# Patient Record
Sex: Male | Born: 1945 | Race: Black or African American | Hispanic: No | Marital: Married | Smoking: Former smoker
Health system: Southern US, Community
[De-identification: ages and names within clinical notes are randomized; demographics above are authoritative.]

## PROBLEM LIST (undated history)

## (undated) DIAGNOSIS — I10 Essential (primary) hypertension: Secondary | ICD-10-CM

## (undated) DIAGNOSIS — G629 Polyneuropathy, unspecified: Secondary | ICD-10-CM

## (undated) DIAGNOSIS — C61 Malignant neoplasm of prostate: Secondary | ICD-10-CM

## (undated) DIAGNOSIS — D649 Anemia, unspecified: Secondary | ICD-10-CM

## (undated) DIAGNOSIS — L600 Ingrowing nail: Secondary | ICD-10-CM

## (undated) DIAGNOSIS — D571 Sickle-cell disease without crisis: Secondary | ICD-10-CM

## (undated) DIAGNOSIS — D573 Sickle-cell trait: Secondary | ICD-10-CM

## (undated) HISTORY — DX: Anemia, unspecified: D64.9

## (undated) HISTORY — PX: COLONOSCOPY: SHX174

## (undated) HISTORY — DX: Ingrowing nail: L60.0

## (undated) HISTORY — PX: CIRCUMCISION: SUR203

## (undated) HISTORY — DX: Essential (primary) hypertension: I10

## (undated) HISTORY — DX: Sickle-cell trait: D57.3

## (undated) HISTORY — DX: Malignant neoplasm of prostate: C61

## (undated) HISTORY — DX: Sickle-cell disease without crisis: D57.1

---

## 2004-08-26 ENCOUNTER — Encounter (HOSPITAL_COMMUNITY): Admission: RE | Admit: 2004-08-26 | Discharge: 2004-11-24 | Payer: Self-pay | Admitting: Internal Medicine

## 2004-08-26 ENCOUNTER — Ambulatory Visit: Payer: Self-pay | Admitting: Internal Medicine

## 2004-09-07 ENCOUNTER — Ambulatory Visit: Payer: Self-pay | Admitting: Internal Medicine

## 2004-09-09 ENCOUNTER — Ambulatory Visit: Payer: Self-pay | Admitting: Gastroenterology

## 2004-09-16 ENCOUNTER — Ambulatory Visit: Payer: Self-pay | Admitting: Gastroenterology

## 2004-09-28 ENCOUNTER — Ambulatory Visit: Payer: Self-pay | Admitting: Gastroenterology

## 2004-10-13 ENCOUNTER — Ambulatory Visit: Payer: Self-pay | Admitting: Internal Medicine

## 2004-10-16 ENCOUNTER — Ambulatory Visit: Payer: Self-pay | Admitting: Gastroenterology

## 2004-10-29 ENCOUNTER — Ambulatory Visit: Payer: Self-pay | Admitting: Gastroenterology

## 2004-11-27 ENCOUNTER — Ambulatory Visit: Payer: Self-pay | Admitting: Internal Medicine

## 2004-12-01 ENCOUNTER — Ambulatory Visit: Payer: Self-pay | Admitting: Internal Medicine

## 2005-01-29 ENCOUNTER — Ambulatory Visit: Payer: Self-pay | Admitting: Internal Medicine

## 2005-04-30 ENCOUNTER — Ambulatory Visit: Payer: Self-pay | Admitting: Internal Medicine

## 2005-08-20 ENCOUNTER — Ambulatory Visit: Payer: Self-pay | Admitting: Internal Medicine

## 2005-08-27 ENCOUNTER — Ambulatory Visit: Payer: Self-pay | Admitting: Internal Medicine

## 2005-11-26 ENCOUNTER — Ambulatory Visit: Payer: Self-pay | Admitting: Internal Medicine

## 2005-12-03 ENCOUNTER — Ambulatory Visit: Payer: Self-pay | Admitting: Internal Medicine

## 2006-02-04 ENCOUNTER — Ambulatory Visit: Payer: Self-pay | Admitting: Internal Medicine

## 2006-02-18 ENCOUNTER — Ambulatory Visit: Payer: Self-pay | Admitting: Internal Medicine

## 2006-04-27 ENCOUNTER — Ambulatory Visit: Payer: Self-pay | Admitting: Internal Medicine

## 2006-07-11 ENCOUNTER — Ambulatory Visit: Payer: Self-pay | Admitting: Internal Medicine

## 2006-11-18 ENCOUNTER — Ambulatory Visit: Payer: Self-pay | Admitting: Internal Medicine

## 2007-04-29 ENCOUNTER — Ambulatory Visit: Payer: Self-pay | Admitting: Internal Medicine

## 2007-11-06 ENCOUNTER — Ambulatory Visit: Payer: Self-pay | Admitting: Internal Medicine

## 2007-11-16 LAB — CONVERTED CEMR LAB
AST: 25 units/L (ref 0–37)
Albumin: 3.6 g/dL (ref 3.5–5.2)
Alkaline Phosphatase: 70 units/L (ref 39–117)
BUN: 9 mg/dL (ref 6–23)
Basophils Absolute: 0 10*3/uL (ref 0.0–0.1)
Calcium: 9.5 mg/dL (ref 8.4–10.5)
Chloride: 103 meq/L (ref 96–112)
Creatinine, Ser: 1.7 mg/dL — ABNORMAL HIGH (ref 0.4–1.5)
Eosinophils Absolute: 0.3 10*3/uL (ref 0.0–0.6)
GFR calc non Af Amer: 44 mL/min
HCT: 34.8 % — ABNORMAL LOW (ref 39.0–52.0)
MCHC: 31.1 g/dL (ref 30.0–36.0)
MCV: 72.9 fL — ABNORMAL LOW (ref 78.0–100.0)
Monocytes Relative: 12.4 % — ABNORMAL HIGH (ref 3.0–11.0)
PSA: 2.69 ng/mL (ref 0.10–4.00)
Platelets: 156 10*3/uL (ref 150–400)
RBC: 4.78 M/uL (ref 4.22–5.81)
RDW: 17.8 % — ABNORMAL HIGH (ref 11.5–14.6)
Total Bilirubin: 0.6 mg/dL (ref 0.3–1.2)

## 2007-11-27 ENCOUNTER — Ambulatory Visit: Payer: Self-pay | Admitting: Internal Medicine

## 2007-11-27 DIAGNOSIS — I10 Essential (primary) hypertension: Secondary | ICD-10-CM

## 2007-11-27 DIAGNOSIS — N400 Enlarged prostate without lower urinary tract symptoms: Secondary | ICD-10-CM

## 2007-11-27 DIAGNOSIS — D573 Sickle-cell trait: Secondary | ICD-10-CM

## 2007-11-27 DIAGNOSIS — R739 Hyperglycemia, unspecified: Secondary | ICD-10-CM

## 2007-11-27 LAB — CONVERTED CEMR LAB
Bilirubin Urine: NEGATIVE
Blood in Urine, dipstick: NEGATIVE
Ketones, urine, test strip: NEGATIVE
Nitrite: NEGATIVE
Urobilinogen, UA: 0.2
pH: 6

## 2008-01-01 ENCOUNTER — Ambulatory Visit: Payer: Self-pay | Admitting: Internal Medicine

## 2008-01-08 ENCOUNTER — Ambulatory Visit: Payer: Self-pay | Admitting: Internal Medicine

## 2009-08-01 ENCOUNTER — Encounter (INDEPENDENT_AMBULATORY_CARE_PROVIDER_SITE_OTHER): Payer: Self-pay | Admitting: *Deleted

## 2009-08-28 ENCOUNTER — Encounter (INDEPENDENT_AMBULATORY_CARE_PROVIDER_SITE_OTHER): Payer: Self-pay | Admitting: *Deleted

## 2009-09-01 ENCOUNTER — Ambulatory Visit: Payer: Self-pay | Admitting: Gastroenterology

## 2009-09-15 ENCOUNTER — Ambulatory Visit: Payer: Self-pay | Admitting: Gastroenterology

## 2009-09-16 ENCOUNTER — Telehealth: Payer: Self-pay | Admitting: Gastroenterology

## 2009-09-17 ENCOUNTER — Encounter: Payer: Self-pay | Admitting: Gastroenterology

## 2010-10-16 ENCOUNTER — Telehealth (INDEPENDENT_AMBULATORY_CARE_PROVIDER_SITE_OTHER): Payer: Self-pay | Admitting: *Deleted

## 2010-11-10 ENCOUNTER — Ambulatory Visit
Admission: RE | Admit: 2010-11-10 | Discharge: 2010-11-10 | Payer: Self-pay | Source: Home / Self Care | Attending: Internal Medicine | Admitting: Internal Medicine

## 2010-11-10 DIAGNOSIS — L600 Ingrowing nail: Secondary | ICD-10-CM | POA: Insufficient documentation

## 2010-11-10 HISTORY — DX: Ingrowing nail: L60.0

## 2010-11-12 NOTE — Progress Notes (Signed)
Summary: refill  Phone Note Refill Request Call back at Home Phone [phone] Message from:  Patient---live call  Refills Requested: Medication #1:  BENICAR HCT 40-12.5 MG  TABS one by mouth daily [facility]  Initial call taken by: Warnell Forester,  October 16, 2010 9:19 AM  Follow-up for Phone Call        sent in 1 refill but absolutely no more without ov- HAS NOT BEEN SEEN IN 3 YEARS! please inform pt Follow-up by: Willy Eddy, LPN,  October 16, 2010 9:29 AM    Prescriptions: BENICAR HCT 40-12.5 MG  TABS (OLMESARTAN MEDOXOMIL-HCTZ) one by mouth daily  #30 x 0   Entered by:   Willy Eddy, LPN   Authorized by:   Stacie Glaze MD   Signed by:   Willy Eddy, LPN on 09/81/1914   Method used:   [facility]        [facility]  [phone]* (retail)       [address] [place], [place]  [address]       Ph: [phone] or [phone]       Fax: [phone]   RxID:   [id number]   Appended Document: refill pt informed and will make ov

## 2010-11-18 NOTE — Assessment & Plan Note (Signed)
Summary: fu on bp/njr   Vital Signs:  Patient profile:   65 year old male Height:      72 inches Weight:      256 pounds BMI:     34.85 Temp:     98.1 degrees F oral Pulse rate:   80 / minute Resp:     14 per minute BP sitting:   144 / 80  (left arm)  Vitals Entered By: Willy Eddy, LPN (November 10, 2010 10:36 AM) CC: - out the state for several years-re-establish- not taking janumet or prednsione, Hypertension Management Is Patient Diabetic? Yes Did you bring your meter with you today? No   Primary Care Provider:  Stacie Glaze MD  CC:  - out the state for several years-re-establish- not taking janumet or prednsione and Hypertension Management.  History of Present Illness: the pt has been out of the area... has kept weight in check but has not been checking CBG's ( was diagnosed as having borderline DM 2 years ago  Hypertension History:      He denies headache, chest pain, palpitations, dyspnea with exertion, orthopnea, PND, peripheral edema, visual symptoms, neurologic problems, syncope, and side effects from treatment.        Positive major cardiovascular risk factors include male age 64 years old or older, diabetes, and hypertension.  Negative major cardiovascular risk factors include non-tobacco-user status.     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 Every Other Day 2)  Benicar Hct 40-12.5 Mg  Tabs (Olmesartan Medoxomil-Hctz) .... One By Mouth Daily 3)  Janumet 50-500 Mg  Tabs (Sitagliptin-Metformin Hcl) .... One By Mouth Faily  Current Medications (verified): 1)  Benicar Hct 40-12.5 Mg  Tabs (Olmesartan Medoxomil-Hctz) .... One By Mouth Daily 2)  Cephalexin 500 Mg Caps (Cephalexin) .... One By Mouth Three Times A Day For 7 Days  Allergies (verified): No Known Drug Allergies  Past History:  Family History: Last updated: 11/27/2007 father unknown mother: alive  Social History: Last updated: 11/27/2007 Married Former Smoker  Risk Factors: Smoking Status: quit (11/10/2010) Passive Smoke Exposure: no (11/10/2010)  Past medical, surgical, family and social histories (including risk factors) reviewed, and no changes noted (except as noted below).  Past Medical History: Reviewed history from 11/27/2007 and no changes required. Hypertension Benign prostatic hypertrophy SS trait  Past Surgical History: Reviewed history from 11/27/2007 and no changes required. [email or web address]  Family History: Reviewed history from 11/27/2007 and no changes required. father unknown mother: alive  Social History: Reviewed history from 11/27/2007 and no changes required. Married Former Smoker  Review of Systems  The patient denies anorexia, fever, weight loss, weight gain, vision loss, decreased hearing, hoarseness, chest pain, syncope, dyspnea on exertion, peripheral edema, prolonged cough, headaches, hemoptysis, abdominal pain, melena, hematochezia, severe indigestion/heartburn, hematuria, incontinence, genital sores, muscle weakness, suspicious skin  lesions, transient blindness, difficulty walking, depression, unusual weight change, abnormal bleeding, enlarged lymph nodes, angioedema, and breast masses.    Physical Exam  General:  Well-developed,well-nourished,in no acute distress;  alert,appropriate and cooperative throughout examination Head:  male-pattern balding.   Eyes:  pupils equal and pupils round.   Nose:  External nasal examination shows no deformity or inflammation. Nasal mucosa are pink and moist without lesions or exudates. Lungs:  Normal respiratory effort, chest expands symmetrically. Lungs are clear to auscultation, no crackles or wheezes. Heart:  Normal rate and regular rhythm. S1 and S2 normal without gallop, murmur, click, rub or other extra sounds. Abdomen:  Bowel sounds positive,abdomen soft and non-tender without masses, organomegaly or hernias noted. Msk:  No deformity or scoliosis noted of thoracic or lumbar spine.   Pulses:  R and L carotid,radial,femoral,dorsalis pedis and posterior tibial pulses are full and equal bilaterally Extremities:   bilateral ingrown toenails on the medial aspect of the great toes with the right foot showing signs of cellulitis at the area of ingrown nail Neurologic:  No cranial nerve deficits noted. Station and gait are normal. Plantar reflexes are down-going bilaterally. DTRs are symmetrical throughout. Sensory, motor and coordinative functions appear intact.  Diabetes Management Exam:    Foot Exam (with socks and/or shoes not present):       Sensory-Pinprick/Light touch:          Left medial foot (L-4): normal          Left dorsal foot (L-5): normal          Left lateral foot (S-1): normal          Right medial foot (L-4): normal          Right dorsal foot (L-5): normal          Right lateral foot (S-1): normal       Sensory-Monofilament:          Left foot: normal          Right foot: normal       Inspection:          Left foot: normal          Right foot: normal       Nails:          Left foot: ingrown          Right foot: ingrown   Impression & Recommendations:  Problem # 1:  DIABETES MELLITUS, TYPE II, WITHOUT COMPLICATIONS (ICD-250.00)  The following medications were removed from the medication  list:    Janumet 50-500 Mg Tabs (Sitagliptin-metformin hcl) ..... One by mouth faily His updated medication list for this problem includes:    Benicar Hct 40-12.5 Mg Tabs (Olmesartan medoxomil-hctz) ..... One by mouth daily  Labs Reviewed: Creat: 1.7 (11/06/2007)    Reviewed HgBA1c results: 5.9 (01/01/2008)  Problem # 2:  HYPERTENSION (ICD-401.9)  His updated medication list for this problem includes:    Benicar Hct 40-12.5 Mg Tabs (Olmesartan medoxomil-hctz) ..... One by mouth daily  BP today: 144/80 Prior BP: 136/84 (01/08/2008)  10 Yr Risk Heart Disease: Not enough information  Labs Reviewed: K+: 4.6 (11/06/2007) Creat: : 1.7 (11/06/2007)     Problem # 3:  BENIGN PROSTATIC HYPERTROPHY (ICD-600.00) Assessment: Unchanged last PSA was 2009 PSA: 2.15 (01/01/2008)    had a CPX in [place]  3 months will need to request information  Problem # 4:  INGROWN NAIL (ICD-703.0) Assessment: New  cellulitis at the site of the  ingrown nail on the right and painful ingrown nail in the left schedule for surgical removal of corner of the nail plate bilaterally His updated medication list for this problem includes:    Cephalexin 500 Mg Caps (Cephalexin) ..... One by mouth three times a day for 7 days  Complete Medication List: 1)  Benicar Hct 40-12.5 Mg Tabs (Olmesartan medoxomil-hctz) .... One by mouth daily 2)  Cephalexin 500 Mg Caps (Cephalexin) .... One by mouth three times a day for 7 days  Hypertension Assessment/Plan:      The patient's hypertensive risk group is category C: Target organ damage and/or diabetes.  Today's blood pressure is 144/80.  His blood pressure goal is < 130/80.  Patient Instructions: 1)  next week  at   friday at 4 PM for  toe nail removal Prescriptions: CEPHALEXIN 500 MG CAPS (CEPHALEXIN) one by mouth three times a day for 7 days  #21 x 0   Entered and Authorized by:   Stacie Glaze MD   Signed by:   Stacie Glaze MD on 11/10/2010   Method used:    [facility]        [facility]  [phone]* (retail)       [address] [place], [place]  [address]       Ph: [phone] or [phone]       Fax: [phone]   RxID:   [id number] BENICAR HCT 40-12.5 MG  TABS (OLMESARTAN MEDOXOMIL-HCTZ) one by mouth daily  #90 x 3   Entered and Authorized by:   Stacie Glaze MD   Signed by:   Stacie Glaze MD on 11/10/2010   Method used:   Electronically to        [facility]* (mail-order)       [address] [place], [place]  [address]       Ph: [phone]       Fax: [phone]   RxID:   [id number] CEPHALEXIN 500 MG CAPS (CEPHALEXIN) one by mouth three times a day for 7 days  #21 x 0   Entered and Authorized by:   Stacie Glaze MD   Signed by:   Stacie Glaze MD on 11/10/2010   Method used:   Electronically to        [facility] [phone]* (retail)       [address]       [place], [place]  [address]       Ph: [phone]       Fax: [phone]   RxID:   [id number]    Orders Added: 1)  Est. Patient Level IV [50093]

## 2010-11-20 ENCOUNTER — Ambulatory Visit (INDEPENDENT_AMBULATORY_CARE_PROVIDER_SITE_OTHER): Payer: BC Managed Care – PPO | Admitting: Internal Medicine

## 2010-11-20 ENCOUNTER — Encounter: Payer: Self-pay | Admitting: Internal Medicine

## 2010-11-20 DIAGNOSIS — L600 Ingrowing nail: Secondary | ICD-10-CM

## 2010-11-20 NOTE — Patient Instructions (Signed)
Elevate her feet tonight as much as possible please wear sandals for 3-4 days keep the dressing in place for 24 hours and then keep Band-Aids in place for 3 days

## 2010-11-20 NOTE — Progress Notes (Signed)
  Subjective:    Patient ID: Caleb Horton, male    DOB: May 13, 1946, 65 y.o.   MRN: [id number]  HPI the patient presents for bilateral toenail avulsions he was treated for cellulitis of the toes bilaterally with an antibiotic for a week prior to his presentation today the infection seems to have abated and the pain is still present he rates the pain as 7/10 he can only wear sandals    Review of Systems  Constitutional: Negative for fever and fatigue.  HENT: Negative for hearing loss, congestion, neck pain and postnasal drip.   Eyes: Negative for discharge, redness and visual disturbance.  Respiratory: Negative for cough, shortness of breath and wheezing.   Cardiovascular: Negative for leg swelling.  Gastrointestinal: Negative for abdominal pain, constipation and abdominal distention.  Genitourinary: Negative for urgency and frequency.  Musculoskeletal: Negative for joint swelling and arthralgias.  Skin: Negative for color change and rash.  Neurological: Negative for weakness and light-headedness.  Hematological: Negative for adenopathy.  Psychiatric/Behavioral: Negative for behavioral problems.       Objective:   Physical Exam  Constitutional: He appears well-developed and well-nourished.    the toenails show hypertrophy of the nail medially on both male that appears to be piercing deeply into the fleshy part of the toe bilaterally. There is no longer any cellulitis or inflammation there is excessive tenderness even to cleaning the site with a Betadine solution.   informed consent was obtained and the patient's pain was controlled with 1% lidocaine without epinephrine 3 cc was injected into each toe using blunt Mayo scissors one third of the toe now was removed and using cautery the nailbed was cauterized in the cuticle removed creating a permanent nail avulsion.   patient tolerated the procedure well sterile dressings were placed bilaterally on each toe and a Kopan wrap was placed  and a sterile dressings samples of Vicodin 5 / 500 #6 were given for pain control tonight and instructions to elevate the toes tonight and to wear sandals for 3-4 day       Assessment & Plan:

## 2011-01-08 ENCOUNTER — Encounter: Payer: Self-pay | Admitting: Internal Medicine

## 2011-12-29 ENCOUNTER — Telehealth: Payer: Self-pay | Admitting: Internal Medicine

## 2011-12-29 MED ORDER — TADALAFIL 20 MG PO TABS: 10.0000 mg | ORAL_TABLET | Freq: Every day | ORAL | Status: DC | PRN

## 2011-12-29 MED ORDER — OLMESARTAN MEDOXOMIL-HCTZ 40-12.5 MG PO TABS: 1.0000 | ORAL_TABLET | Freq: Every day | ORAL | Status: DC

## 2011-12-29 NOTE — Telephone Encounter (Signed)
Patient called stating that he need a refill on his benicar and [facility]is [facility] [address]. Please assist.

## 2011-12-29 NOTE — Telephone Encounter (Signed)
Med sent to pharmacy and pt informed he needs ov before anymore refills

## 2011-12-29 NOTE — Telephone Encounter (Signed)
Please let pt know that I cannot order meds for 1 year until he has ov.( not seen in over a year)--I did however call in a 3 month supply until his cpx in June at which time we will send in a 1 year supply

## 2011-12-29 NOTE — Telephone Encounter (Signed)
Pt requesting for refill on olmesartan-hydrochlorothiazide (BENICAR HCT) 40-12.5 MG per tablet be resent to [facility]

## 2012-01-10 ENCOUNTER — Other Ambulatory Visit: Payer: Self-pay | Admitting: Internal Medicine

## 2012-03-07 ENCOUNTER — Other Ambulatory Visit (INDEPENDENT_AMBULATORY_CARE_PROVIDER_SITE_OTHER): Payer: BC Managed Care – PPO

## 2012-03-07 DIAGNOSIS — Z0000 Encounter for general adult medical examination without abnormal findings: Secondary | ICD-10-CM

## 2012-03-07 LAB — POCT URINALYSIS DIPSTICK
Bilirubin, UA: NEGATIVE
Blood, UA: NEGATIVE
Glucose, UA: NEGATIVE
Ketones, UA: NEGATIVE
Leukocytes, UA: NEGATIVE
Nitrite, UA: NEGATIVE
Protein, UA: NEGATIVE
Spec Grav, UA: 1.015
Urobilinogen, UA: 0.2
pH, UA: 5.5

## 2012-03-07 LAB — LIPID PANEL
LDL Cholesterol: 117 mg/dL — ABNORMAL HIGH (ref 0–99)
Total CHOL/HDL Ratio: 4
VLDL: 16.6 mg/dL (ref 0.0–40.0)

## 2012-03-07 LAB — HEPATIC FUNCTION PANEL
ALT: 19 U/L (ref 0–53)
AST: 21 U/L (ref 0–37)
Albumin: 3.7 g/dL (ref 3.5–5.2)
Alkaline Phosphatase: 69 U/L (ref 39–117)
Bilirubin, Direct: 0.1 mg/dL (ref 0.0–0.3)
Total Bilirubin: 0.6 mg/dL (ref 0.3–1.2)
Total Protein: 8.6 g/dL — ABNORMAL HIGH (ref 6.0–8.3)

## 2012-03-07 LAB — CBC WITH DIFFERENTIAL/PLATELET
Basophils Absolute: 0 K/uL (ref 0.0–0.1)
Basophils Relative: 0.6 % (ref 0.0–3.0)
Eosinophils Absolute: 0.1 K/uL (ref 0.0–0.7)
Eosinophils Relative: 1.5 % (ref 0.0–5.0)
HCT: 40.3 % (ref 39.0–52.0)
Hemoglobin: 13.2 g/dL (ref 13.0–17.0)
Lymphocytes Relative: 29.6 % (ref 12.0–46.0)
Lymphs Abs: 1.8 K/uL (ref 0.7–4.0)
MCHC: 32.9 g/dL (ref 30.0–36.0)
MCV: 87.1 fl (ref 78.0–100.0)
Monocytes Absolute: 0.7 K/uL (ref 0.1–1.0)
Monocytes Relative: 11.2 % (ref 3.0–12.0)
Neutro Abs: 3.5 K/uL (ref 1.4–7.7)
Neutrophils Relative %: 57.1 % (ref 43.0–77.0)
Platelets: 117 K/uL — ABNORMAL LOW (ref 150.0–400.0)
RBC: 4.63 Mil/uL (ref 4.22–5.81)
RDW: 15.1 % — ABNORMAL HIGH (ref 11.5–14.6)
WBC: 6.2 K/uL (ref 4.5–10.5)

## 2012-03-07 LAB — BASIC METABOLIC PANEL
BUN: 22 mg/dL (ref 6–23)
CO2: 26 mEq/L (ref 19–32)
Calcium: 9.8 mg/dL (ref 8.4–10.5)
Chloride: 109 mEq/L (ref 96–112)
Creatinine, Ser: 1.6 mg/dL — ABNORMAL HIGH (ref 0.4–1.5)
GFR: 55.32 mL/min — ABNORMAL LOW (ref >60.00)
Glucose, Bld: 99 mg/dL (ref 70–99)
Potassium: 4.7 mEq/L (ref 3.5–5.1)
Sodium: 142 mEq/L (ref 135–145)

## 2012-03-07 LAB — TSH: TSH: 1 u[IU]/mL (ref 0.35–5.50)

## 2012-03-13 ENCOUNTER — Ambulatory Visit (INDEPENDENT_AMBULATORY_CARE_PROVIDER_SITE_OTHER): Payer: BC Managed Care – PPO | Admitting: Internal Medicine

## 2012-03-13 ENCOUNTER — Encounter: Payer: Self-pay | Admitting: Internal Medicine

## 2012-03-13 VITALS — BP 122/60 | HR 80 | Temp 98.3°F | Resp 16 | Ht 72.0 in | Wt 252.0 lb

## 2012-03-13 DIAGNOSIS — Z0000 Encounter for general adult medical examination without abnormal findings: Secondary | ICD-10-CM

## 2012-03-13 DIAGNOSIS — D473 Essential (hemorrhagic) thrombocythemia: Secondary | ICD-10-CM

## 2012-03-13 LAB — CBC WITH DIFFERENTIAL/PLATELET
Basophils Relative: 0.7 % (ref 0.0–3.0)
Eosinophils Absolute: 0.1 10*3/uL (ref 0.0–0.7)
Eosinophils Relative: 1.8 % (ref 0.0–5.0)
Lymphocytes Relative: 30.4 % (ref 12.0–46.0)
MCHC: 33.1 g/dL (ref 30.0–36.0)
Monocytes Absolute: 1 10*3/uL (ref 0.1–1.0)
Neutrophils Relative %: 53.3 % (ref 43.0–77.0)
Platelets: 137 10*3/uL — ABNORMAL LOW (ref 150.0–400.0)
RBC: 4.78 Mil/uL (ref 4.22–5.81)
WBC: 7.2 10*3/uL (ref 4.5–10.5)

## 2012-03-13 LAB — VITAMIN B12: Vitamin B-12: 466 pg/mL (ref 211–911)

## 2012-03-13 MED ORDER — OMEGA-3 FATTY ACIDS 1000 MG PO CAPS: 2.0000 g | ORAL_CAPSULE | Freq: Every day | ORAL | Status: AC

## 2012-03-13 NOTE — Patient Instructions (Signed)
The patient is instructed to continue all medications as prescribed. Schedule followup with check out clerk upon leaving the clinic  

## 2012-03-13 NOTE — Progress Notes (Signed)
Subjective:    Patient ID: Caleb Horton, male    DOB: 1946-04-11, 66 y.o.   MRN: [id number]  HPI This is a 66 year old black male who presents for an annual examination has a history of hypertension he and sickle cell trait in the past he has had severe anemia.  He has benign prostatic hypertrophy and history of adult onset diabetes that has been diet controlled. His blood pressure is well controlled on his current medications he denies any chest pain shortness of breath PND orthopnea his blood work showed mildly decreased platelets which may be related to sickle cell and we will repeat a CBC differential as well as a B12 today.  His PSA is within range of his PSA 4 years ago. His LDL cholesterol is moderately elevated.       Review of Systems  Constitutional: Negative for fever and fatigue.  HENT: Negative for hearing loss, congestion, neck pain and postnasal drip.   Eyes: Negative for discharge, redness and visual disturbance.  Respiratory: Negative for cough, shortness of breath and wheezing.   Cardiovascular: Negative for leg swelling.  Gastrointestinal: Negative for abdominal pain, constipation and abdominal distention.  Genitourinary: Negative for urgency and frequency.  Musculoskeletal: Negative for joint swelling and arthralgias.  Skin: Negative for color change and rash.  Neurological: Negative for weakness and light-headedness.  Hematological: Negative for adenopathy.  Psychiatric/Behavioral: Negative for behavioral problems.       No past medical history on file.  History   Social History  . Marital Status: Married    Spouse Name: N/A    Number of Children: N/A  . Years of Education: N/A   Occupational History  . Not on file.   Social History Main Topics  . Smoking status: Never Smoker   . Smokeless tobacco: Not on file  . Alcohol Use: Yes  . Drug Use: No  . Sexually Active: Not on file   Other Topics Concern  . Not on file   Social History  Narrative  . No narrative on file    No past surgical history on file.  No family history on file.  No Known Allergies  Current Outpatient Prescriptions on File Prior to Visit  Medication Sig Dispense Refill  . BENICAR HCT 40-12.5 MG per tablet TAKE 1 TABLET DAILY  90 tablet  3    BP 122/60  Pulse 80  Temp 98.3 F (36.8 C)  Resp 16  Ht 6' (1.829 m)  Wt 252 lb (114.306 kg)  BMI 34.18 kg/m2    Objective:   Physical Exam  Constitutional: He is oriented to person, place, and time. He appears well-developed and well-nourished.  HENT:  Head: Normocephalic and atraumatic.  Eyes: Conjunctivae are normal. Pupils are equal, round, and reactive to light.  Neck: Normal range of motion. Neck supple.  Cardiovascular: Normal rate and regular rhythm.   Pulmonary/Chest: Effort normal and breath sounds normal.  Abdominal: Soft. Bowel sounds are normal.  Genitourinary: Prostate normal.       Benign prostatic hypertrophy approximately 45-50 g  Musculoskeletal: Normal range of motion.  Neurological: He is alert and oriented to person, place, and time.  Skin: Skin is warm and dry.  Psychiatric: He has a normal mood and affect.          Assessment & Plan:   Patient presents for yearly preventative medicine examination.   all immunizations and health maintenance protocols were reviewed with the patient and they are up to date with these protocols.  screening laboratory values were reviewed with the patient including screening of hyperlipidemia PSA renal function and hepatic function.   There medications past medical history social history problem list and allergies were reviewed in detail.   Goals were established with regard to weight loss exercise diet in compliance with medications His PSA is stable.  His platelet count dropped therefore we will check a CBC differential and a B12 level today I suspect that this may be related to his sickle cell trait.  His blood sugars are  still remaining normal.  And his cholesterol is only slightly elevated and we will add omega-3 use 2000 mg by mouth

## 2012-07-17 ENCOUNTER — Ambulatory Visit: Payer: BC Managed Care – PPO | Admitting: Internal Medicine

## 2012-08-15 ENCOUNTER — Encounter: Payer: Self-pay | Admitting: Internal Medicine

## 2012-08-15 ENCOUNTER — Ambulatory Visit (INDEPENDENT_AMBULATORY_CARE_PROVIDER_SITE_OTHER): Payer: Medicare Other | Admitting: Internal Medicine

## 2012-08-15 VITALS — BP 140/80 | HR 72 | Temp 98.6°F | Resp 16 | Ht 72.0 in | Wt 258.0 lb

## 2012-08-15 DIAGNOSIS — E785 Hyperlipidemia, unspecified: Secondary | ICD-10-CM

## 2012-08-15 DIAGNOSIS — I10 Essential (primary) hypertension: Secondary | ICD-10-CM

## 2012-08-15 DIAGNOSIS — Z23 Encounter for immunization: Secondary | ICD-10-CM

## 2012-08-15 DIAGNOSIS — D649 Anemia, unspecified: Secondary | ICD-10-CM

## 2012-08-15 NOTE — Addendum Note (Signed)
Addended by: Stacie Glaze on: 08/15/2012 10:38 AM   Modules accepted: Level of Service

## 2012-08-15 NOTE — Progress Notes (Signed)
  Subjective:    Patient ID: [name], male    DOB: 07-Jul-1946, 66 y.o.   MRN: [id number]  HPI Patient presents for followup of hypertension on Benicar hyperlipidemia on facial adult-onset diabetes risk with diet control   Review of Systems  Constitutional: Negative for fever and fatigue.  HENT: Negative for hearing loss, congestion, neck pain and postnasal drip.   Eyes: Negative for discharge, redness and visual disturbance.  Respiratory: Negative for cough, shortness of breath and wheezing.   Cardiovascular: Negative for leg swelling.  Gastrointestinal: Negative for abdominal pain, constipation and abdominal distention.  Genitourinary: Negative for urgency and frequency.  Musculoskeletal: Negative for joint swelling and arthralgias.  Skin: Negative for color change and rash.  Neurological: Negative for weakness and light-headedness.  Hematological: Negative for adenopathy.  Psychiatric/Behavioral: Negative for behavioral problems.   Past Medical History  Diagnosis Date  . Hypertension   . Anemia     History   Social History  . Marital Status: Married    Spouse Name: N/A    Number of Children: N/A  . Years of Education: N/A   Occupational History  . Not on file.   Social History Main Topics  . Smoking status: Never Smoker   . Smokeless tobacco: Not on file  . Alcohol Use: Yes  . Drug Use: No  . Sexually Active: Not on file   Other Topics Concern  . Not on file   Social History Narrative  . No narrative on file    No past surgical history on file.  No family history on file.  No Known Allergies  Current Outpatient Prescriptions on File Prior to Visit  Medication Sig Dispense Refill  . BENICAR HCT 40-12.5 MG per tablet TAKE 1 TABLET DAILY  90 tablet  3  . fish oil-omega-3 fatty acids 1000 MG capsule Take 2 capsules (2 g total) by mouth daily.        BP 140/80  Pulse 72  Temp 98.6 F (37 C)  Resp 16  Ht 6' (1.829 m)  Wt 258 lb (117.028 kg)   BMI 34.99 kg/m2        Objective:   Physical Exam  Nursing note and vitals reviewed. Constitutional: He appears well-developed and well-nourished.  HENT:  Head: Normocephalic and atraumatic.  Eyes: Conjunctivae normal are normal. Pupils are equal, round, and reactive to light.  Neck: Normal range of motion. Neck supple.  Cardiovascular: Normal rate and regular rhythm.   Pulmonary/Chest: Effort normal and breath sounds normal.  Abdominal: Soft. Bowel sounds are normal.          Assessment & Plan:   this is a 66 year old African American male who is followed for hypertension and a history of anemia.  He presents today for a CBC differential to look at his blood count and also a basic metabolic panel to ensure his potassium and renal function is normal his blood pressure looks good today he has borderline diabetes is diet controlled We discussed a gluten-free diet which might help both with his weight control his hypertension and his risk of diabetes.

## 2012-08-15 NOTE — Patient Instructions (Addendum)
"  look up practical paleo"   Can buy the book form [facility]

## 2012-08-15 NOTE — Progress Notes (Signed)
  Subjective:    Patient ID: Caleb Horton, male    DOB: 01/08/1946, 66 y.o.   MRN: [id number]  HPI Follow up for HTn and lipids Needs labs    Review of Systems  Constitutional: Negative for fever and fatigue.  HENT: Negative for hearing loss, congestion, neck pain and postnasal drip.   Eyes: Negative for discharge, redness and visual disturbance.  Respiratory: Negative for cough, shortness of breath and wheezing.   Cardiovascular: Negative for leg swelling.  Gastrointestinal: Negative for abdominal pain, constipation and abdominal distention.  Genitourinary: Negative for urgency and frequency.  Musculoskeletal: Negative for joint swelling and arthralgias.  Skin: Negative for color change and rash.  Neurological: Negative for weakness and light-headedness.  Hematological: Negative for adenopathy.  Psychiatric/Behavioral: Negative for behavioral problems.   Past Medical History  Diagnosis Date  . Hypertension   . Anemia     History   Social History  . Marital Status: Married    Spouse Name: N/A    Number of Children: N/A  . Years of Education: N/A   Occupational History  . Not on file.   Social History Main Topics  . Smoking status: Never Smoker   . Smokeless tobacco: Not on file  . Alcohol Use: Yes  . Drug Use: No  . Sexually Active: Not on file   Other Topics Concern  . Not on file   Social History Narrative  . No narrative on file    No past surgical history on file.  No family history on file.  No Known Allergies  Current Outpatient Prescriptions on File Prior to Visit  Medication Sig Dispense Refill  . BENICAR HCT 40-12.5 MG per tablet TAKE 1 TABLET DAILY  90 tablet  3  . fish oil-omega-3 fatty acids 1000 MG capsule Take 2 capsules (2 g total) by mouth daily.        BP 140/80  Pulse 72  Temp 98.6 F (37 C)  Resp 16  Ht 6' (1.829 m)  Wt 258 lb (117.028 kg)  BMI 34.99 kg/m2       Objective:   Physical Exam  Nursing note and vitals  reviewed. Constitutional: He is oriented to person, place, and time. He appears well-developed and well-nourished.  HENT:  Head: Normocephalic and atraumatic.  Eyes: Conjunctivae normal are normal. Pupils are equal, round, and reactive to light.  Neck: Normal range of motion. Neck supple.  Cardiovascular: Normal rate and regular rhythm.   Pulmonary/Chest: Effort normal and breath sounds normal.  Abdominal: Soft. Bowel sounds are normal.  Neurological: He is alert and oriented to person, place, and time.          Assessment & Plan:  moniter labs and stable HTN

## 2012-10-16 ENCOUNTER — Ambulatory Visit (INDEPENDENT_AMBULATORY_CARE_PROVIDER_SITE_OTHER): Payer: Medicare Other | Admitting: Internal Medicine

## 2012-10-16 ENCOUNTER — Encounter: Payer: Self-pay | Admitting: Internal Medicine

## 2012-10-16 VITALS — BP 140/80 | HR 72 | Temp 98.0°F | Resp 16 | Ht 72.0 in | Wt 266.0 lb

## 2012-10-16 DIAGNOSIS — E119 Type 2 diabetes mellitus without complications: Secondary | ICD-10-CM

## 2012-10-16 DIAGNOSIS — IMO0001 Reserved for inherently not codable concepts without codable children: Secondary | ICD-10-CM

## 2012-10-16 DIAGNOSIS — E785 Hyperlipidemia, unspecified: Secondary | ICD-10-CM

## 2012-10-16 DIAGNOSIS — D649 Anemia, unspecified: Secondary | ICD-10-CM

## 2012-10-16 DIAGNOSIS — I10 Essential (primary) hypertension: Secondary | ICD-10-CM

## 2012-10-16 NOTE — Progress Notes (Signed)
  Subjective:    Patient ID: Caleb Horton, male    DOB: 09-04-1946, 67 y.o.   MRN: [id number]  HPI  Had a syncopal event. DM  And increased gas. Possible vaso-vagal Possible drop in blood glucose   Review of Systems  Constitutional: Negative for fever and fatigue.  HENT: Negative for hearing loss, congestion, neck pain and postnasal drip.   Eyes: Negative for discharge, redness and visual disturbance.  Respiratory: Negative for cough, shortness of breath and wheezing.   Cardiovascular: Negative for leg swelling.  Gastrointestinal: Negative for abdominal pain, constipation and abdominal distention.  Genitourinary: Negative for urgency and frequency.  Musculoskeletal: Negative for joint swelling and arthralgias.  Skin: Negative for color change and rash.  Neurological: Negative for weakness and light-headedness.  Hematological: Negative for adenopathy.  Psychiatric/Behavioral: Negative for behavioral problems.   Past Medical History  Diagnosis Date  . Hypertension   . Anemia     History   Social History  . Marital Status: Married    Spouse Name: N/A    Number of Children: N/A  . Years of Education: N/A   Occupational History  . Not on file.   Social History Main Topics  . Smoking status: Never Smoker   . Smokeless tobacco: Not on file  . Alcohol Use: Yes  . Drug Use: No  . Sexually Active: Not on file   Other Topics Concern  . Not on file   Social History Narrative  . No narrative on file    No past surgical history on file.  No family history on file.  No Known Allergies  Current Outpatient Prescriptions on File Prior to Visit  Medication Sig Dispense Refill  . BENICAR HCT 40-12.5 MG per tablet TAKE 1 TABLET DAILY  90 tablet  3  . fish oil-omega-3 fatty acids 1000 MG capsule Take 2 capsules (2 g total) by mouth daily.        BP 140/80  Pulse 72  Temp 98 F (36.7 C)  Resp 16  Ht 6' (1.829 m)  Wt 266 lb (120.657 kg)  BMI 36.08 kg/m2         Objective:   Physical Exam  Nursing note and vitals reviewed. Constitutional: He appears well-developed and well-nourished.  HENT:  Head: Normocephalic and atraumatic.  Eyes: Conjunctivae normal are normal. Pupils are equal, round, and reactive to light.  Neck: Normal range of motion. Neck supple.  Cardiovascular: Normal rate and regular rhythm.   Pulmonary/Chest: Effort normal and breath sounds normal.  Abdominal: Soft. Bowel sounds are normal.          Assessment & Plan:  The most likey explanation was a vaso vagal event ( had a BM and the symptoms stopped) other likely possibility is low blood glucose

## 2012-10-17 LAB — BASIC METABOLIC PANEL
BUN: 18 mg/dL (ref 6–23)
CO2: 29 mEq/L (ref 19–32)
Calcium: 9.9 mg/dL (ref 8.4–10.5)
Chloride: 105 mEq/L (ref 96–112)
Creatinine, Ser: 1.4 mg/dL (ref 0.4–1.5)
Glucose, Bld: 96 mg/dL (ref 70–99)

## 2012-10-17 LAB — CBC WITH DIFFERENTIAL/PLATELET
Basophils Relative: 0.6 % (ref 0.0–3.0)
Eosinophils Absolute: 0.2 10*3/uL (ref 0.0–0.7)
Eosinophils Relative: 3 % (ref 0.0–5.0)
Hemoglobin: 13.9 g/dL (ref 13.0–17.0)
Lymphocytes Relative: 31.7 % (ref 12.0–46.0)
Monocytes Relative: 12.1 % — ABNORMAL HIGH (ref 3.0–12.0)
Neutro Abs: 3.6 10*3/uL (ref 1.4–7.7)
Neutrophils Relative %: 52.6 % (ref 43.0–77.0)
RBC: 4.84 Mil/uL (ref 4.22–5.81)
WBC: 6.8 10*3/uL (ref 4.5–10.5)

## 2012-10-17 LAB — LIPID PANEL
LDL Cholesterol: 119 mg/dL — ABNORMAL HIGH (ref 0–99)
Total CHOL/HDL Ratio: 7
Triglycerides: 184 mg/dL — ABNORMAL HIGH (ref 0.0–149.0)

## 2012-10-17 LAB — HEMOGLOBIN A1C: Hgb A1c MFr Bld: 5.8 % (ref 4.6–6.5)

## 2013-01-11 ENCOUNTER — Other Ambulatory Visit: Payer: Self-pay | Admitting: Internal Medicine

## 2013-02-12 ENCOUNTER — Encounter: Payer: Self-pay | Admitting: Internal Medicine

## 2013-02-12 ENCOUNTER — Ambulatory Visit (INDEPENDENT_AMBULATORY_CARE_PROVIDER_SITE_OTHER): Payer: Medicare Other | Admitting: Internal Medicine

## 2013-02-12 VITALS — BP 124/70 | HR 76 | Temp 98.2°F | Resp 16 | Ht 72.0 in | Wt 262.0 lb

## 2013-02-12 DIAGNOSIS — I10 Essential (primary) hypertension: Secondary | ICD-10-CM

## 2013-02-12 DIAGNOSIS — N400 Enlarged prostate without lower urinary tract symptoms: Secondary | ICD-10-CM

## 2013-02-12 DIAGNOSIS — Z23 Encounter for immunization: Secondary | ICD-10-CM

## 2013-02-12 DIAGNOSIS — E119 Type 2 diabetes mellitus without complications: Secondary | ICD-10-CM

## 2013-02-12 LAB — BASIC METABOLIC PANEL
CO2: 26 mEq/L (ref 19–32)
Chloride: 105 mEq/L (ref 96–112)
Glucose, Bld: 122 mg/dL — ABNORMAL HIGH (ref 70–99)
Sodium: 136 mEq/L (ref 135–145)

## 2013-02-12 LAB — HEMOGLOBIN A1C: Hgb A1c MFr Bld: 5.9 % (ref 4.6–6.5)

## 2013-02-12 NOTE — Progress Notes (Signed)
  Subjective:    Patient ID: Caleb Horton, male    DOB: 07/22/1946, 67 y.o.   MRN: [id number]  HPI This is a 67 year old male followed for diabetes hypertension and BPH. This patient has  diet controlled adult-onset diabetes with a hemoglobin A1c of 5.9 hemoglobin A1c and a basic metabolic panel will be drawn today he is stable hypertension on Benicar 40-25 He has a history of BPH     Review of Systems  Constitutional: Negative for fever and fatigue.  HENT: Negative for hearing loss, congestion, neck pain and postnasal drip.   Eyes: Negative for discharge, redness and visual disturbance.  Respiratory: Negative for cough, shortness of breath and wheezing.   Cardiovascular: Negative for leg swelling.  Gastrointestinal: Negative for abdominal pain, constipation and abdominal distention.  Genitourinary: Negative for urgency and frequency.  Musculoskeletal: Negative for joint swelling and arthralgias.  Skin: Negative for color change and rash.  Neurological: Negative for weakness and light-headedness.  Hematological: Negative for adenopathy.  Psychiatric/Behavioral: Negative for behavioral problems.   Past Medical History  Diagnosis Date  . Hypertension   . Anemia     History   Social History  . Marital Status: Married    Spouse Name: N/A    Number of Children: N/A  . Years of Education: N/A   Occupational History  . Not on file.   Social History Main Topics  . Smoking status: Never Smoker   . Smokeless tobacco: Not on file  . Alcohol Use: Yes  . Drug Use: No  . Sexually Active: Not on file   Other Topics Concern  . Not on file   Social History Narrative  . No narrative on file    No past surgical history on file.  No family history on file.  No Known Allergies  Current Outpatient Prescriptions on File Prior to Visit  Medication Sig Dispense Refill  . BENICAR HCT 40-25 MG per tablet TAKE 1 TABLET EVERY DAY  90 tablet  0  . fish oil-omega-3 fatty acids  1000 MG capsule Take 2 capsules (2 g total) by mouth daily.       No current facility-administered medications on file prior to visit.    BP 124/70  Pulse 76  Temp(Src) 98.2 F (36.8 C)  Resp 16  Ht 6' (1.829 m)  Wt 262 lb (118.842 kg)  BMI 35.53 kg/m2       Objective:   Physical Exam  Nursing note and vitals reviewed. Constitutional: He appears well-developed and well-nourished.  HENT:  Head: Normocephalic and atraumatic.  Eyes: Conjunctivae are normal. Pupils are equal, round, and reactive to light.  Neck: Normal range of motion. Neck supple.  Cardiovascular: Normal rate and regular rhythm.   Pulmonary/Chest: Effort normal and breath sounds normal.  Abdominal: Soft. Bowel sounds are normal.          Assessment & Plan:  Stable hypertension with current medications we'll measure a basic metabolic panel today to look at renal function and potassium.  History of adult onset diabetes mild with diet control we'll measure hemoglobin A1c today.  History of sickle trait. History of benign prostatic hypertrophy

## 2013-02-12 NOTE — Addendum Note (Signed)
Addended by: Willy Eddy on: 02/12/2013 09:21 AM   Modules accepted: Orders

## 2013-02-12 NOTE — Patient Instructions (Signed)
The patient is instructed to continue all medications as prescribed. Schedule followup with check out clerk upon leaving the clinic  

## 2013-06-07 ENCOUNTER — Other Ambulatory Visit: Payer: Self-pay | Admitting: Internal Medicine

## 2013-07-10 ENCOUNTER — Other Ambulatory Visit: Payer: Self-pay | Admitting: *Deleted

## 2013-07-10 MED ORDER — OLMESARTAN MEDOXOMIL-HCTZ 40-25 MG PO TABS: ORAL_TABLET | ORAL | Status: DC

## 2013-09-03 ENCOUNTER — Encounter: Payer: Medicare Other | Admitting: Internal Medicine

## 2013-09-17 ENCOUNTER — Ambulatory Visit (INDEPENDENT_AMBULATORY_CARE_PROVIDER_SITE_OTHER): Payer: Medicare Other | Admitting: Internal Medicine

## 2013-09-17 ENCOUNTER — Encounter: Payer: Self-pay | Admitting: Internal Medicine

## 2013-09-17 VITALS — BP 132/74 | HR 76 | Temp 98.0°F | Resp 16 | Ht 72.0 in | Wt 272.0 lb

## 2013-09-17 DIAGNOSIS — N400 Enlarged prostate without lower urinary tract symptoms: Secondary | ICD-10-CM

## 2013-09-17 DIAGNOSIS — I10 Essential (primary) hypertension: Secondary | ICD-10-CM

## 2013-09-17 DIAGNOSIS — E119 Type 2 diabetes mellitus without complications: Secondary | ICD-10-CM

## 2013-09-17 DIAGNOSIS — E785 Hyperlipidemia, unspecified: Secondary | ICD-10-CM

## 2013-09-17 DIAGNOSIS — D573 Sickle-cell trait: Secondary | ICD-10-CM

## 2013-09-17 DIAGNOSIS — Z0000 Encounter for general adult medical examination without abnormal findings: Secondary | ICD-10-CM

## 2013-09-17 LAB — HEPATIC FUNCTION PANEL
ALT: 44 U/L (ref 0–53)
AST: 36 U/L (ref 0–37)
Albumin: 3.9 g/dL (ref 3.5–5.2)
Total Protein: 8.9 g/dL — ABNORMAL HIGH (ref 6.0–8.3)

## 2013-09-17 LAB — HEMOGLOBIN A1C: Hgb A1c MFr Bld: 5.7 % (ref 4.6–6.5)

## 2013-09-17 LAB — BASIC METABOLIC PANEL
BUN: 33 mg/dL — ABNORMAL HIGH (ref 6–23)
Chloride: 103 mEq/L (ref 96–112)
Creatinine, Ser: 2 mg/dL — ABNORMAL HIGH (ref 0.4–1.5)
Glucose, Bld: 109 mg/dL — ABNORMAL HIGH (ref 70–99)
Potassium: 4.1 mEq/L (ref 3.5–5.1)

## 2013-09-17 LAB — CBC WITH DIFFERENTIAL/PLATELET
Basophils Absolute: 0 10*3/uL (ref 0.0–0.1)
Eosinophils Absolute: 0.2 10*3/uL (ref 0.0–0.7)
HCT: 39.4 % (ref 39.0–52.0)
Hemoglobin: 13.1 g/dL (ref 13.0–17.0)
Lymphs Abs: 2.1 10*3/uL (ref 0.7–4.0)
MCHC: 33.4 g/dL (ref 30.0–36.0)
MCV: 87 fl (ref 78.0–100.0)
Monocytes Absolute: 0.7 10*3/uL (ref 0.1–1.0)
Monocytes Relative: 12.1 % — ABNORMAL HIGH (ref 3.0–12.0)
Neutro Abs: 3 10*3/uL (ref 1.4–7.7)
RDW: 14.8 % — ABNORMAL HIGH (ref 11.5–14.6)

## 2013-09-17 LAB — LIPID PANEL
Cholesterol: 203 mg/dL — ABNORMAL HIGH (ref 0–200)
Triglycerides: 113 mg/dL (ref 0.0–149.0)

## 2013-09-17 LAB — POCT URINALYSIS DIPSTICK
Bilirubin, UA: NEGATIVE
Blood, UA: NEGATIVE
Ketones, UA: NEGATIVE
Leukocytes, UA: NEGATIVE
Spec Grav, UA: 1.015
Urobilinogen, UA: 0.2
pH, UA: 5.5

## 2013-09-17 LAB — PSA: PSA: 3.23 ng/mL (ref 0.10–4.00)

## 2013-09-17 NOTE — Progress Notes (Signed)
Subjective:    Patient ID: Caleb Horton, male    DOB: 04-28-1946, 67 y.o.   MRN: [id number]  Diabetes He presents for his follow-up diabetic visit. He has type 2 diabetes mellitus. Pertinent negatives for diabetes include no blurred vision, no chest pain, no fatigue, no foot paresthesias, no foot ulcerations, no visual change and no weakness. Symptoms are stable.  Anemia There has been no abdominal pain, fever or light-headedness.   Patient is a 67 year old male who presents for his Medicare wellness as well as followup for chronic medical problems which include sickle cell trait and an iron deficiency anemia hypertension on a ARB class drug with a diuretic a history of mild elevation of lipids history of elevated PSA in the setting of benign prostatic hypertrophy with obstruction. Generally he has been doing well.   Review of Systems  Constitutional: Negative for fever and fatigue.  HENT: Negative for congestion, hearing loss and postnasal drip.   Eyes: Negative for blurred vision, discharge, redness and visual disturbance.  Respiratory: Negative for cough, shortness of breath and wheezing.   Cardiovascular: Negative for chest pain and leg swelling.  Gastrointestinal: Negative for abdominal pain, constipation and abdominal distention.  Genitourinary: Negative for urgency and frequency.  Musculoskeletal: Negative for arthralgias, joint swelling and neck pain.  Skin: Negative for color change and rash.  Neurological: Negative for weakness and light-headedness.  Hematological: Negative for adenopathy.  Psychiatric/Behavioral: Negative for behavioral problems.   Past Medical History  Diagnosis Date  . Hypertension   . Anemia     History   Social History  . Marital Status: Married    Spouse Name: N/A    Number of Children: N/A  . Years of Education: N/A   Occupational History  . Not on file.   Social History Main Topics  . Smoking status: Never Smoker   . Smokeless  tobacco: Not on file  . Alcohol Use: Yes  . Drug Use: No  . Sexual Activity: Not on file   Other Topics Concern  . Not on file   Social History Narrative  . No narrative on file    History reviewed. No pertinent past surgical history.  No family history on file.  No Known Allergies  Current Outpatient Prescriptions on File Prior to Visit  Medication Sig Dispense Refill  . olmesartan-hydrochlorothiazide (BENICAR HCT) 40-25 MG per tablet TAKE 1 TABLET EVERY DAY  90 tablet  3   No current facility-administered medications on file prior to visit.    BP 132/74  Pulse 76  Temp(Src) 98 F (36.7 C)  Resp 16  Ht 6' (1.829 m)  Wt 272 lb (123.378 kg)  BMI 36.88 kg/m2       Objective:   Physical Exam  Constitutional: He is oriented to person, place, and time. He appears well-developed and well-nourished.  HENT:  Head: Normocephalic and atraumatic.  Eyes: Conjunctivae are normal. Pupils are equal, round, and reactive to light.  Neck: Normal range of motion. Neck supple.  Cardiovascular: Normal rate and regular rhythm.   Pulmonary/Chest: Effort normal and breath sounds normal.  Abdominal: Soft. Bowel sounds are normal.  Genitourinary:  Enlarged prostate  Musculoskeletal: He exhibits edema and tenderness.  Neurological: He is alert and oriented to person, place, and time.  Skin: Skin is warm and dry.  Psychiatric: He has a normal mood and affect. His behavior is normal.          Assessment & Plan:  Monitor appropriate laboratory values including  a basic metabolic panel for hypertension liver functions and a lipid panel for cholesterol monitoring.  A TSH due to history of mild to moderate hyperlipidemia.  Hemoglobin A1c because of his diabetes. Subjective:    Caleb Horton is a 67 y.o. male who presents for Medicare Annual/Subsequent preventive examination.   Preventive Screening-Counseling & Management  Tobacco History  Smoking status  . Never Smoker     Smokeless tobacco  . Not on file    Problems Prior to Visit 1.   Current Problems (verified) Patient Active Problem List   Diagnosis Date Noted  . INGROWN NAIL 11/10/2010  . DIABETES MELLITUS, TYPE II, WITHOUT COMPLICATIONS 11/27/2007  . SICKLE-CELL TRAIT 11/27/2007  . HYPERTENSION 11/27/2007  . BENIGN PROSTATIC HYPERTROPHY 11/27/2007    Medications Prior to Visit Current Outpatient Prescriptions on File Prior to Visit  Medication Sig Dispense Refill  . olmesartan-hydrochlorothiazide (BENICAR HCT) 40-25 MG per tablet TAKE 1 TABLET EVERY DAY  90 tablet  3   No current facility-administered medications on file prior to visit.    Current Medications (verified) Current Outpatient Prescriptions  Medication Sig Dispense Refill  . ferrous sulfate 325 (65 FE) MG tablet Take 325 mg by mouth daily with breakfast.      . olmesartan-hydrochlorothiazide (BENICAR HCT) 40-25 MG per tablet TAKE 1 TABLET EVERY DAY  90 tablet  3   No current facility-administered medications for this visit.     Allergies (verified) Review of patient's allergies indicates no known allergies.   PAST HISTORY  Family History No family history on file.  Social History History  Substance Use Topics  . Smoking status: Never Smoker   . Smokeless tobacco: Not on file  . Alcohol Use: Yes    Are there smokers in your home (other than you)?  No  Risk Factors Current exercise habits: The patient does not participate in regular exercise at present.  Dietary issues discussed: weight conrtrol   Cardiac risk factors: diabetes mellitus and dyslipidemia.  Depression Screen (Note: if answer to either of the following is "Yes", a more complete depression screening is indicated)   Q1: Over the past two weeks, have you felt down, depressed or hopeless? No  Q2: Over the past two weeks, have you felt little interest or pleasure in doing things? No  Have you lost interest or pleasure in daily life? No  Do you  often feel hopeless? No  Do you cry easily over simple problems? No  Activities of Daily Living In your present state of health, do you have any difficulty performing the following activities?:  Driving? No Managing money?  No Feeding yourself? No Getting from bed to chair? No Climbing a flight of stairs? No Preparing food and eating?: No Bathing or showering? No Getting dressed: No Getting to the toilet? No Using the toilet:No Moving around from place to place: No In the past year have you fallen or had a near fall?:No   Are you sexually active?  Yes  Do you have more than one partner?  No  Hearing Difficulties: No Do you often ask people to speak up or repeat themselves? No Do you experience ringing or noises in your ears? No Do you have difficulty understanding soft or whispered voices? No   Do you feel that you have a problem with memory? No  Do you often misplace items? No  Do you feel safe at home?  yes  Cognitive Testing  Alert? Yes  Normal Appearance?Yes  Oriented to person?  Yes  Place? Yes   Time? Yes  Recall of three objects?  Yes  Can perform simple calculations? Yes  Displays appropriate judgment?Yes  Can read the correct time from a watch face?Yes   Advanced Directives have been discussed with the patient? Yes   List the Names of Other Physician/Practitioners you currently use: 1.    Indicate any recent Medical Services you may have received from other than [facility] providers in the past year (date may be approximate).  Immunization History  Administered Date(s) Administered  . Influenza Split 08/15/2012  . Influenza, High Dose Seasonal PF 07/14/2013  . Pneumococcal Polysaccharide-23 02/12/2013  . Td 10/11/2005    Screening Tests Health Maintenance  Topic Date Due  . Zostavax  06/20/2007  . Influenza Vaccine  05/11/2014  . Tetanus/tdap  10/12/2015  . Colonoscopy  09/11/2019  . Pneumococcal Polysaccharide Vaccine Age 16 And Over  Completed     All answers were reviewed with the patient and necessary referrals were made:  Carrie Mew, MD   09/17/2013   History reviewed: allergies, current medications, past family history, past medical history, past social history, past surgical history and problem list  Review of Systems Pertinent items are noted in HPI.    Objective:     Vision by Snellen chart: right eye:20/20, left eye:20/20 Blood pressure 132/74, pulse 76, temperature 98 F (36.7 C), resp. rate 16, height 6' (1.829 m), weight 272 lb (123.378 kg). Body mass index is 36.88 kg/(m^2).  See problem focused exam attached     Assessment:      Patient presents for yearly preventative medicine examination.   all immunizations and health maintenance protocols were reviewed with the patient and they are up to date with these protocols.   screening laboratory values were reviewed with the patient including screening of hyperlipidemia PSA renal function and hepatic function.   There medications past medical history social history problem list and allergies were reviewed in detail.   Goals were established with regard to weight loss exercise diet in compliance with medications      Plan:     During the course of the visit the patient was educated and counseled about appropriate screening and preventive services including:    Pneumococcal vaccine   Influenza vaccine  Prostate cancer screening  Diabetes screening  Diet review for nutrition referral? Yes __x__  Not Indicated ____   Patient Instructions (the written plan) was given to the patient.  Medicare Attestation I have personally reviewed: The patient's medical and social history Their use of alcohol, tobacco or illicit drugs Their current medications and supplements The patient's functional ability including ADLs,fall risks, home safety risks, cognitive, and hearing and visual impairment Diet and physical activities Evidence for depression  or mood disorders  The patient's weight, height, BMI, and visual acuity have been recorded in the chart.  I have made referrals, counseling, and provided education to the patient based on review of the above and I have provided the patient with a written personalized care plan for preventive services.     Carrie Mew, MD   09/17/2013

## 2013-09-17 NOTE — Progress Notes (Signed)
Pre visit review using our clinic review tool, if applicable. No additional management support is needed unless otherwise documented below in the visit note. 

## 2013-09-17 NOTE — Patient Instructions (Signed)
The patient is instructed to continue all medications as prescribed. Schedule followup with check out clerk upon leaving the clinic  

## 2014-03-27 ENCOUNTER — Telehealth: Payer: Self-pay | Admitting: Internal Medicine

## 2014-03-27 NOTE — Telephone Encounter (Signed)
Pt would like to switch to dr kim. Can I sch? °

## 2014-03-28 NOTE — Telephone Encounter (Signed)
Pt will wait on dr hunter

## 2014-03-28 NOTE — Telephone Encounter (Signed)
Ok Needs 11:15 Monday appt. Next available in 2016 I believe. If wants sooner - can establish with Dr. Yong Channel, [facility] or other provider.

## 2014-05-10 ENCOUNTER — Telehealth: Payer: Self-pay | Admitting: Internal Medicine

## 2014-05-10 ENCOUNTER — Telehealth: Payer: Self-pay | Admitting: *Deleted

## 2014-05-10 NOTE — Telephone Encounter (Signed)
Left message on machine for patient to return our call for follow up diabetes Diabetic bundle Labs will be ordered at that time

## 2014-05-10 NOTE — Telephone Encounter (Signed)
[facility] [place], [place] - [address] is requesting re-fill on olmesartan-hydrochlorothiazide (BENICAR HCT) 40-25 MG per tablet

## 2014-05-13 MED ORDER — OLMESARTAN MEDOXOMIL-HCTZ 40-25 MG PO TABS: ORAL_TABLET | ORAL | Status: DC

## 2014-05-13 NOTE — Telephone Encounter (Signed)
Pt is scheduled °

## 2014-05-13 NOTE — Telephone Encounter (Signed)
90 day supply given, pt will need OV

## 2014-05-17 ENCOUNTER — Other Ambulatory Visit (INDEPENDENT_AMBULATORY_CARE_PROVIDER_SITE_OTHER): Payer: Commercial Managed Care - HMO

## 2014-05-17 ENCOUNTER — Telehealth: Payer: Self-pay | Admitting: Internal Medicine

## 2014-05-17 DIAGNOSIS — E1165 Type 2 diabetes mellitus with hyperglycemia: Principal | ICD-10-CM

## 2014-05-17 DIAGNOSIS — IMO0001 Reserved for inherently not codable concepts without codable children: Secondary | ICD-10-CM

## 2014-05-17 DIAGNOSIS — E785 Hyperlipidemia, unspecified: Secondary | ICD-10-CM

## 2014-05-17 LAB — LIPID PANEL
CHOL/HDL RATIO: 5
Cholesterol: 187 mg/dL (ref 0–200)
HDL: 35.3 mg/dL — ABNORMAL LOW (ref >39.00)
LDL CALC: 135 mg/dL — AB (ref 0–99)
NONHDL: 151.7
TRIGLYCERIDES: 85 mg/dL (ref 0.0–149.0)
VLDL: 17 mg/dL (ref 0.0–40.0)

## 2014-05-17 LAB — BASIC METABOLIC PANEL
BUN: 29 mg/dL — AB (ref 6–23)
CALCIUM: 10.1 mg/dL (ref 8.4–10.5)
CO2: 27 meq/L (ref 19–32)
CREATININE: 1.9 mg/dL — AB (ref 0.4–1.5)
Chloride: 103 mEq/L (ref 96–112)
GFR: 46.85 mL/min — ABNORMAL LOW (ref >60.00)
GLUCOSE: 108 mg/dL — AB (ref 70–99)
Potassium: 3.9 mEq/L (ref 3.5–5.1)
Sodium: 135 mEq/L (ref 135–145)

## 2014-05-17 LAB — MICROALBUMIN / CREATININE URINE RATIO
CREATININE, U: 183.1 mg/dL
MICROALB UR: 0.8 mg/dL (ref 0.0–1.9)
Microalb Creat Ratio: 0.4 mg/g (ref 0.0–30.0)

## 2014-05-17 LAB — HEMOGLOBIN A1C: HEMOGLOBIN A1C: 5.7 % (ref 4.6–6.5)

## 2014-05-17 NOTE — Telephone Encounter (Signed)
Pt would like to become new pt dr Maudie Mercury. Can I sch?

## 2014-05-17 NOTE — Telephone Encounter (Signed)
Sure - needs new patient visit - I think next available is in 2016. Or if he doesn't want to wait that long can see one of the new providers mathew or stephen hunter.

## 2014-05-21 NOTE — Telephone Encounter (Signed)
Pt will go with dr hunter

## 2014-05-22 ENCOUNTER — Telehealth: Payer: Self-pay | Admitting: Internal Medicine

## 2014-05-22 NOTE — Telephone Encounter (Signed)
Pt would like blood work results °

## 2014-05-29 NOTE — Telephone Encounter (Signed)
Per Dr. Arnoldo Morale;   Glucose is elevated- watch your weight and sugar intake  Kidneys are stable  Cholesterol is good  and triglycerides are better.   HgA1c is ok  Called and spoke with pt and pt is aware.

## 2014-05-31 ENCOUNTER — Ambulatory Visit (INDEPENDENT_AMBULATORY_CARE_PROVIDER_SITE_OTHER): Payer: Commercial Managed Care - HMO | Admitting: Family Medicine

## 2014-05-31 ENCOUNTER — Encounter: Payer: Self-pay | Admitting: Family Medicine

## 2014-05-31 VITALS — BP 100/72 | HR 80 | Temp 98.5°F | Ht 72.0 in | Wt 249.0 lb

## 2014-05-31 DIAGNOSIS — M25512 Pain in left shoulder: Secondary | ICD-10-CM

## 2014-05-31 DIAGNOSIS — M25519 Pain in unspecified shoulder: Secondary | ICD-10-CM

## 2014-05-31 DIAGNOSIS — M542 Cervicalgia: Secondary | ICD-10-CM

## 2014-05-31 NOTE — Progress Notes (Signed)
  Garret Reddish, MD Phone: [phone]  Subjective:   Caleb Horton is a 68 y.o. year old very pleasant male patient who presents with the following:  Left neck and shoulder pain Symptoms started about a month ago. Primarily notes it when trying to sit up straight or driving with his left arm up in the car. If he moves his arm out in front of him and massages the left shoulder the pain seems to go away. Pain is in left upper neck and down behind back of left shoulder. Described as 3-4/10 aching pain. Occasionally has some numbness into his left fingers. Last about 15 seconds maximum if he is able to move the arm and reposition. No pain with lifting arm overhead or reaching behind the back. Pain does not wake him from sleep and he does not wake up in the morning with it. He is not tried any therapies ROS-no arm weakness, no hand weakness, no fecal or urinary incontinence  Past Medical History-resolved type 2 diabetes this patient has never had A1c above 6 and replaced with hyperglycemia. Sickle cell trait. Hypertension. BPH. Denies history of arthritis.  Medications- reviewed and updated Current Outpatient Prescriptions  Medication Sig Dispense Refill  . ferrous sulfate 325 (65 FE) MG tablet Take 325 mg by mouth daily with breakfast.      . olmesartan-hydrochlorothiazide (BENICAR HCT) 40-25 MG per tablet TAKE 1 TABLET EVERY DAY  90 tablet  0   No current facility-administered medications for this visit.    Objective: BP 100/72  Pulse 80  Temp(Src) 98.5 F (36.9 C)  Ht 6' (1.829 m)  Wt 249 lb (112.946 kg)  BMI 33.76 kg/m2 Gen: NAD, resting comfortably CV: RRR no murmurs rubs or gallops Lungs: CTAB no crackles, wheeze, rhonchi  Neck: Pain with turning neck to the right and left into back of left neck, with spurlings some pain noted in shoulder  Left Shoulder: Inspection reveals no abnormalities, atrophy or asymmetry. Palpation is normal with no tenderness over AC joint or  bicipital groove. ROM is full in all planes. Rotator cuff strength normal throughout. No signs of impingement with negative Neer and Hawkin's tests, empty can. Normal scapular function observed. No painful arc and no drop arm sign.   Neuro: 5/5 strength upper extremities, light touch intact, 2+ biceps reflexes  Assessment/Plan:  Left neck and shoulder pain Suspect arthritis as underlying issue ultimately leading to nerve irritation. Check cervical spine films. Discussed considering prednisone 20 mg x7 days. could also consider gabapentin. Unclear if patient is going to use medications to treat this.  Orders Placed This Encounter  Procedures  . DG Cervical Spine Complete    Standing Status: Future     Number of Occurrences:      Standing Expiration Date: 08/01/2015    Order Specific Question:  Reason for Exam (SYMPTOM  OR DIAGNOSIS REQUIRED)    Answer:  left neck and arm pain with some numbness in left hand    Order Specific Question:  Preferred imaging location?    Answer:  [facility]

## 2014-05-31 NOTE — Patient Instructions (Signed)
Neck and shoulder pain with some occasional numbness in hand  X-ray of your neck today  Suspect some arthritis as a cause of nerve irritation  Consider trial prednisone if there is arthritis

## 2014-06-03 ENCOUNTER — Ambulatory Visit (INDEPENDENT_AMBULATORY_CARE_PROVIDER_SITE_OTHER)
Admission: RE | Admit: 2014-06-03 | Discharge: 2014-06-03 | Disposition: A | Discharge status: Home / Self Care | Payer: Commercial Managed Care - HMO | Source: Ambulatory Visit | Attending: Family Medicine | Admitting: Family Medicine

## 2014-06-03 DIAGNOSIS — M542 Cervicalgia: Secondary | ICD-10-CM

## 2014-08-07 ENCOUNTER — Encounter: Payer: Self-pay | Admitting: Internal Medicine

## 2014-09-04 ENCOUNTER — Encounter: Payer: Self-pay | Admitting: Family Medicine

## 2014-09-04 ENCOUNTER — Ambulatory Visit (INDEPENDENT_AMBULATORY_CARE_PROVIDER_SITE_OTHER): Payer: Commercial Managed Care - HMO | Admitting: Family Medicine

## 2014-09-04 VITALS — BP 130/78 | Temp 98.7°F | Wt 252.0 lb

## 2014-09-04 DIAGNOSIS — N183 Chronic kidney disease, stage 3 unspecified: Secondary | ICD-10-CM

## 2014-09-04 DIAGNOSIS — E785 Hyperlipidemia, unspecified: Secondary | ICD-10-CM | POA: Insufficient documentation

## 2014-09-04 DIAGNOSIS — N182 Chronic kidney disease, stage 2 (mild): Secondary | ICD-10-CM | POA: Insufficient documentation

## 2014-09-04 DIAGNOSIS — I10 Essential (primary) hypertension: Secondary | ICD-10-CM

## 2014-09-04 DIAGNOSIS — N400 Enlarged prostate without lower urinary tract symptoms: Secondary | ICD-10-CM

## 2014-09-04 NOTE — Assessment & Plan Note (Signed)
Excellent control on Benicar-hydrochlorthiazide 40-25 milligrams. Continue current medications. This also helps with CKD

## 2014-09-04 NOTE — Assessment & Plan Note (Addendum)
poor control. I recommended a statin given 10 year cardiovascular risk above 19%. Patient declines at this time. He realizes this increases his risk of heart attack or stroke. No asa given GI bleed history and thrombocytopenia.

## 2014-09-04 NOTE — Patient Instructions (Addendum)
Recommend cholesterol medicine. You opted to hold off for now.   Kidney function technically chronic kidney disease stage 3. Want to see you in 4 months for recheck. Not sure why this is at this level because it worsened over last year.   Let's do a physical in April with bloodwork 1 week before.   Health Maintenance Due  Topic Date Due  . ZOSTAVAX/shingles -call your insurance  06/20/2007

## 2014-09-04 NOTE — Assessment & Plan Note (Signed)
Recent worsening over last year from stage II to stage III. GFR declined from around 60 to 45. Unclear why he's had his declined. He has been stable on Benicar and blood pressures been well-controlled. We will follow-up at next visit with repeat blood work. If he continues to progress would consider nephrology referral.

## 2014-09-04 NOTE — Progress Notes (Signed)
Caleb Reddish, MD Phone: [phone]  Subjective:  Patient presents today to establish care with me as their new primary care provider. Patient was formerly a patient of Dr. Arnoldo Morale. Chief complaint-noted.   Hyperlipidemia-Poor control with 10 year risk above 19%  Lab Results  Component Value Date   LDLCALC 135* 05/17/2014  On statin: no Regular exercise: no ROS- no chest pain or shortness of breath. No myalgias  Hypertension-good control  BP Readings from Last 3 Encounters:  09/04/14 130/78  05/31/14 100/72  09/17/13 132/74   Home BP monitoring-no Compliant with medications-yes without side effects ROS-Denies any CP, HA, SOB, blurry vision, LE edema.   Chronic Kidney Disease Stage III-stable GFR had remained near 60 from 2013-2014 with Cr 1.4-1.6. Last December creatinine worsened to 2 and in august this was 1.9 showing GFR of near 45. Patient has been on benicar for several years and is compliant.  ROS- no changes in urination   The following were reviewed and entered/updated in epic: Past Medical History  Diagnosis Date  . Hypertension   . Anemia   . INGROWN NAIL 11/10/2010    Annotation: bilateral  Qualifier: Diagnosis of  By: Arnoldo Morale MD, Balinda Quails    Patient Active Problem List   Diagnosis Date Noted  . Hyperlipidemia 09/04/2014    Priority: Medium  . CKD (chronic kidney disease), stage III 09/04/2014    Priority: Medium  . Essential hypertension 11/27/2007    Priority: Medium  . Hyperglycemia 11/27/2007    Priority: Low  . Sickle-cell trait 11/27/2007    Priority: Low  . BPH (benign prostatic hyperplasia) 11/27/2007    Priority: Low   Past Surgical History  Procedure Laterality Date  . Circumcision      age 12    Family History  Problem Relation Age of Onset  . Hypertension Father   . Diabetes Mother     Medications- reviewed and updated Current Outpatient Prescriptions  Medication Sig Dispense Refill  . ferrous sulfate 325 (65 FE) MG tablet  Take 325 mg by mouth daily with breakfast.    . olmesartan-hydrochlorothiazide (BENICAR HCT) 40-25 MG per tablet TAKE 1 TABLET EVERY DAY 90 tablet 0   No current facility-administered medications for this visit.    Allergies-reviewed and updated No Known Allergies  History   Social History  . Marital Status: Married    Spouse Name: N/A    Number of Children: N/A  . Years of Education: N/A   Social History Main Topics  . Smoking status: Never Smoker   . Smokeless tobacco: None  . Alcohol Use: 2.4 oz/week    4 Not specified per week  . Drug Use: No  . Sexual Activity: None   Other Topics Concern  . None   Social History Narrative   Married (3rd marriage). 5 children (3 from 1st, 2 from second), 7 grandkids.       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  CV: RRR no murmurs rubs or gallops Lungs: CTAB no crackles, wheeze, rhonchi Abdomen: soft/nontender/nondistended/normal bowel sounds.  Ext: no edema Skin: warm, dry, no rash  Neuro: grossly normal, moves all extremities   Assessment/Plan:  Hyperlipidemia poor control. I recommended a statin given 10 year cardiovascular risk above 19%.  Patient declines at this time. He realizes this increases his risk of heart attack or stroke. No asa given GI bleed history and thrombocytopenia.   Essential hypertension Excellent control on Benicar-hydrochlorthiazide 40-25 milligrams. Continue current medications. This also helps with CKD  CKD (chronic kidney disease), stage III Recent worsening over last year from stage II to stage III. GFR declined from around 60 to 45. Unclear why he's had his declined. He has been stable on Benicar and blood pressures been well-controlled. We will follow-up at next visit  with repeat blood work. If he continues to progress would consider nephrology referral.    Future fasting labs for physical. Does have nocturia with BPH Orders Placed This Encounter  Procedures  . Lipid Panel    Standing Status: Future     Number of Occurrences:      Standing Expiration Date: 10/11/2015  . TSH    Standing Status: Future     Number of Occurrences:      Standing Expiration Date: 10/11/2015  . CBC (no diff)    Standing Status: Future     Number of Occurrences:      Standing Expiration Date: 10/11/2015  . CMP    Standing Status: Future     Number of Occurrences:      Standing Expiration Date: 10/11/2015  . PSA    Standing Status: Future     Number of Occurrences:      Standing Expiration Date: 10/11/2015  . POC Urinalysis Dipstick    Standing Status: Future     Number of Occurrences:      Standing Expiration Date: 10/11/2015

## 2014-09-05 ENCOUNTER — Other Ambulatory Visit: Payer: Self-pay | Admitting: Internal Medicine

## 2014-09-09 ENCOUNTER — Other Ambulatory Visit: Payer: Self-pay

## 2014-09-09 MED ORDER — OLMESARTAN MEDOXOMIL-HCTZ 40-25 MG PO TABS: ORAL_TABLET | ORAL | Status: DC

## 2015-01-28 ENCOUNTER — Other Ambulatory Visit (INDEPENDENT_AMBULATORY_CARE_PROVIDER_SITE_OTHER): Payer: Commercial Managed Care - HMO

## 2015-01-28 DIAGNOSIS — N400 Enlarged prostate without lower urinary tract symptoms: Secondary | ICD-10-CM

## 2015-01-28 DIAGNOSIS — Z0000 Encounter for general adult medical examination without abnormal findings: Secondary | ICD-10-CM

## 2015-01-28 DIAGNOSIS — E785 Hyperlipidemia, unspecified: Secondary | ICD-10-CM

## 2015-01-28 DIAGNOSIS — I10 Essential (primary) hypertension: Secondary | ICD-10-CM

## 2015-01-28 LAB — CBC
HEMATOCRIT: 39.8 % (ref 39.0–52.0)
Hemoglobin: 13.3 g/dL (ref 13.0–17.0)
MCHC: 33.3 g/dL (ref 30.0–36.0)
MCV: 84.3 fl (ref 78.0–100.0)
Platelets: 148 10*3/uL — ABNORMAL LOW (ref 150.0–400.0)
RBC: 4.72 Mil/uL (ref 4.22–5.81)
RDW: 14.8 % (ref 11.5–15.5)
WBC: 6.9 10*3/uL (ref 4.0–10.5)

## 2015-01-28 LAB — POCT URINALYSIS DIPSTICK
Bilirubin, UA: NEGATIVE
Blood, UA: NEGATIVE
Glucose, UA: NEGATIVE
KETONES UA: NEGATIVE
Leukocytes, UA: NEGATIVE
Nitrite, UA: NEGATIVE
PROTEIN UA: NEGATIVE
SPEC GRAV UA: 1.015
Urobilinogen, UA: 0.2
pH, UA: 5.5

## 2015-01-28 LAB — PSA: PSA: 3.04 ng/mL (ref 0.10–4.00)

## 2015-01-28 LAB — COMPREHENSIVE METABOLIC PANEL
ALT: 14 U/L (ref 0–53)
AST: 18 U/L (ref 0–37)
Albumin: 3.9 g/dL (ref 3.5–5.2)
Alkaline Phosphatase: 70 U/L (ref 39–117)
BUN: 26 mg/dL — ABNORMAL HIGH (ref 6–23)
CALCIUM: 10.5 mg/dL (ref 8.4–10.5)
CO2: 29 mEq/L (ref 19–32)
Chloride: 104 mEq/L (ref 96–112)
Creatinine, Ser: 1.46 mg/dL (ref 0.40–1.50)
GFR: 61.82 mL/min (ref >60.00)
Glucose, Bld: 106 mg/dL — ABNORMAL HIGH (ref 70–99)
POTASSIUM: 4.3 meq/L (ref 3.5–5.1)
Sodium: 136 mEq/L (ref 135–145)
TOTAL PROTEIN: 8.8 g/dL — AB (ref 6.0–8.3)
Total Bilirubin: 0.4 mg/dL (ref 0.2–1.2)

## 2015-01-28 LAB — LIPID PANEL
Cholesterol: 190 mg/dL (ref 0–200)
HDL: 39 mg/dL — ABNORMAL LOW (ref >39.00)
LDL Cholesterol: 128 mg/dL — ABNORMAL HIGH (ref 0–99)
NONHDL: 151
Total CHOL/HDL Ratio: 5
Triglycerides: 113 mg/dL (ref 0.0–149.0)
VLDL: 22.6 mg/dL (ref 0.0–40.0)

## 2015-01-28 LAB — HEPATIC FUNCTION PANEL
ALK PHOS: 70 U/L (ref 39–117)
ALT: 14 U/L (ref 0–53)
AST: 18 U/L (ref 0–37)
Albumin: 3.9 g/dL (ref 3.5–5.2)
BILIRUBIN TOTAL: 0.4 mg/dL (ref 0.2–1.2)
Bilirubin, Direct: 0.1 mg/dL (ref 0.0–0.3)
Total Protein: 8.8 g/dL — ABNORMAL HIGH (ref 6.0–8.3)

## 2015-01-28 LAB — TSH: TSH: 2.21 u[IU]/mL (ref 0.35–4.50)

## 2015-02-04 ENCOUNTER — Encounter: Payer: Self-pay | Admitting: Family Medicine

## 2015-02-04 ENCOUNTER — Ambulatory Visit (INDEPENDENT_AMBULATORY_CARE_PROVIDER_SITE_OTHER): Payer: Commercial Managed Care - HMO | Admitting: Family Medicine

## 2015-02-04 VITALS — BP 102/56 | HR 100 | Temp 98.7°F | Ht 72.0 in | Wt 260.0 lb

## 2015-02-04 DIAGNOSIS — N183 Chronic kidney disease, stage 3 unspecified: Secondary | ICD-10-CM

## 2015-02-04 DIAGNOSIS — Z23 Encounter for immunization: Secondary | ICD-10-CM | POA: Diagnosis not present

## 2015-02-04 DIAGNOSIS — Z0000 Encounter for general adult medical examination without abnormal findings: Secondary | ICD-10-CM

## 2015-02-04 DIAGNOSIS — E785 Hyperlipidemia, unspecified: Secondary | ICD-10-CM

## 2015-02-04 DIAGNOSIS — R779 Abnormality of plasma protein, unspecified: Secondary | ICD-10-CM | POA: Insufficient documentation

## 2015-02-04 DIAGNOSIS — R739 Hyperglycemia, unspecified: Secondary | ICD-10-CM

## 2015-02-04 DIAGNOSIS — E8809 Other disorders of plasma-protein metabolism, not elsewhere classified: Secondary | ICD-10-CM | POA: Insufficient documentation

## 2015-02-04 MED ORDER — ATORVASTATIN CALCIUM 10 MG PO TABS: 10.0000 mg | ORAL_TABLET | Freq: Every day | ORAL | Status: DC

## 2015-02-04 NOTE — Progress Notes (Signed)
Caleb Reddish, MD Phone: [phone]  Subjective:  Patient presents today for their annual physical. Chief complaint-noted. See problem oriented charting as well as A/P for health maintainence  ROS- full  review of systems was completed and negative with pertinent negatives to include no unintentional weight loss, night sweats, fatigue, chest pain or shortness of breath, decreased urination, frequent urination other than nocturia.   The following were reviewed and entered/updated in epic: Past Medical History  Diagnosis Date  . Hypertension   . Anemia   . INGROWN NAIL 11/10/2010    Annotation: bilateral  Qualifier: Diagnosis of  By: Arnoldo Morale MD, Balinda Quails    Patient Active Problem List   Diagnosis Date Noted  . Hyperlipidemia 09/04/2014    Priority: Medium  . CKD (chronic kidney disease), stage III 09/04/2014    Priority: Medium  . Essential hypertension 11/27/2007    Priority: Medium  . Hyperglycemia 11/27/2007    Priority: Low  . Sickle-cell trait 11/27/2007    Priority: Low  . BPH (benign prostatic hyperplasia) 11/27/2007    Priority: Low  . Elevated blood protein 02/04/2015   Past Surgical History  Procedure Laterality Date  . Circumcision      age 27    Family History  Problem Relation Age of Onset  . Hypertension Father   . Diabetes Mother     Medications- reviewed and updated Current Outpatient Prescriptions  Medication Sig Dispense Refill  . ferrous sulfate 325 (65 FE) MG tablet Take 325 mg by mouth daily with breakfast.    . olmesartan-hydrochlorothiazide (BENICAR HCT) 40-25 MG per tablet TAKE 1 TABLET EVERY DAY 90 tablet 0   No current facility-administered medications for this visit.    Allergies-reviewed and updated No Known Allergies  History   Social History  . Marital Status: Married    Spouse Name: N/A  . Number of Children: N/A  . Years of Education: N/A   Social History Main Topics  . Smoking status: Never Smoker   . Smokeless  tobacco: Not on file  . Alcohol Use: 2.4 oz/week    4 Standard drinks or equivalent per week  . Drug Use: No  . Sexual Activity: Not on file   Other Topics Concern  . None   Social History Narrative   Married (3rd marriage). 5 children (3 from 1st, 2 from second), 7 grandkids.       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 Oropharynx normal Neck: no thyromegaly CV: RRR no murmurs rubs or gallops Lungs: CTAB no crackles, wheeze, rhonchi Abdomen: soft/nontender/nondistended/normal bowel sounds. No rebound or guarding.  Ext: no edema Skin: warm, dry Neuro: grossly normal, moves all extremities, PERRLA  Assessment/Plan:  69 y.o. male presenting for annual physical.  Health Maintenance counseling: 1. Anticipatory guidance: Patient counseled regarding regular dental exams, wearing seatbelts. 2. Risk factor reduction:  Advised patient of need for regular exercise (daily workout aerobics 45 minutes for 2 years)and diet rich and fruits and vegetables to reduce risk of heart attack and stroke. Encouraged weight goal 240-245. Wants to cut down on starches, overeating.  3. Immunizations/screenings/ancillary studies- shingles vaccine today, 5 year colonoscopy will be later this year  Elevated blood protein S: elevated over 2 years when previously normal. Also chronically mildly low platelets, GFR near 60 but as low as mid 40s,  A/P: I have some but low level concern for multiple myeloma (no fractures, kidney stones, but does have Cr elevation and elevated protein). Up to date recommends "appropriate initial screening tests include a serum protein electrophoresis along with immunofixation, and a serum free light chain  assay". Ordered available tests of multiple myeloma screen serumand kappa/lamda light chains. If any abnormalities, consider hematology consult.     Hyperglycemia Encouraged 240-245 weight goal by  Month follow up to reduce risk DM   CKD (chronic kidney disease), stage III GFR improved to around 60, continue BP control, add lipid control   Hyperlipidemia Now agreeable to statin. Start atorvastatin 48m daily. Follow up lipids next visit.    6 month follow up.   Orders Placed This Encounter  Procedures  . Varicella-zoster vaccine subcutaneous  . Multiple myeloma panel, serum  . Kappa/lambda light chains    Meds ordered this encounter  Medications  . atorvastatin (LIPITOR) 10 MG tablet    Sig: Take 1 tablet (10 mg total) by mouth daily.    Dispense:  90 tablet    Refill:  3

## 2015-02-04 NOTE — Patient Instructions (Addendum)
Received Zostavax (SHINGLES) injection today.  Great job with exercise. Let's try to get weight down with some healthier eating options. Weight watchers would be one idea. This should help reduce your risk of progressing to diabetes from at risk for diabetes.   Start cholesterol medicine to protect kidneys  BP looks great  Check a few follow up blood tests due to high protein in your blood. Would consider a blood doctor/hematologist if any abnormalities

## 2015-02-04 NOTE — Assessment & Plan Note (Addendum)
S: elevated over 2 years when previously normal. Also chronically mildly low platelets, GFR near 60 but as low as mid 40s,  A/P: I have some but low level concern for multiple myeloma (no fractures, kidney stones, but does have Cr elevation and elevated protein). Up to date recommends "appropriate initial screening tests include a serum protein electrophoresis along with immunofixation, and a serum free light chain assay". Ordered available tests of multiple myeloma screen serumand kappa/lamda light chains. If any abnormalities, consider hematology consult.

## 2015-02-04 NOTE — Assessment & Plan Note (Signed)
GFR improved to around 60, continue BP control, add lipid control

## 2015-02-04 NOTE — Assessment & Plan Note (Signed)
Now agreeable to statin. Start atorvastatin 10mg  daily. Follow up lipids next visit.

## 2015-02-04 NOTE — Assessment & Plan Note (Signed)
Encouraged 240-245 weight goal by  Month follow up to reduce risk DM

## 2015-02-05 LAB — KAPPA/LAMBDA LIGHT CHAINS
KAPPA FREE LGHT CHN: 11.7 mg/dL — AB (ref 0.33–1.94)
Kappa:Lambda Ratio: 2.02 — ABNORMAL HIGH (ref 0.26–1.65)
LAMBDA FREE LGHT CHN: 5.78 mg/dL — AB (ref 0.57–2.63)

## 2015-02-10 ENCOUNTER — Telehealth: Payer: Self-pay | Admitting: Family Medicine

## 2015-02-10 DIAGNOSIS — R779 Abnormality of plasma protein, unspecified: Secondary | ICD-10-CM

## 2015-02-10 LAB — MULTIPLE MYELOMA PANEL, SERUM
ABNORMAL PROTEIN BAND3: NOT DETECTED g/dL
Abnormal Protein Band2: NOT DETECTED g/dL
Albumin ELP: 4.1 g/dL (ref 3.8–4.8)
Alpha-1-Globulin: 0.3 g/dL (ref 0.2–0.3)
Alpha-2-Globulin: 0.6 g/dL (ref 0.5–0.9)
Beta 2: 0.4 g/dL (ref 0.2–0.5)
Beta Globulin: 0.4 g/dL (ref 0.4–0.6)
Gamma Globulin: 3.2 g/dL — ABNORMAL HIGH (ref 0.8–1.7)
IGA: 233 mg/dL (ref 68–379)
IgG (Immunoglobin G), Serum: 3650 mg/dL — ABNORMAL HIGH (ref 650–1600)
IgM, Serum: 121 mg/dL (ref 41–251)
Total Protein, Serum Electrophoresis: 8.9 g/dL — ABNORMAL HIGH (ref 6.1–8.1)

## 2015-02-10 NOTE — Telephone Encounter (Signed)
Keba- I placed a referral to hematology for elevated protein. Patient's tests came back inconclusive from my interpretation so I want to get hematology's opinion on why his protein is high in his blood.   I asked him to call us back by voicemail.

## 2015-02-13 ENCOUNTER — Telehealth: Payer: Self-pay | Admitting: Hematology

## 2015-02-13 NOTE — Telephone Encounter (Signed)
Called pt and scheduled appt with him to see Dr. Burr Medico.  Feng 03/20/15 10:30 am

## 2015-02-14 ENCOUNTER — Encounter: Payer: Self-pay | Admitting: Internal Medicine

## 2015-03-20 ENCOUNTER — Ambulatory Visit (HOSPITAL_BASED_OUTPATIENT_CLINIC_OR_DEPARTMENT_OTHER): Payer: Commercial Managed Care - HMO

## 2015-03-20 ENCOUNTER — Ambulatory Visit (HOSPITAL_BASED_OUTPATIENT_CLINIC_OR_DEPARTMENT_OTHER): Payer: Commercial Managed Care - HMO | Admitting: Hematology

## 2015-03-20 ENCOUNTER — Encounter: Payer: Self-pay | Admitting: Hematology

## 2015-03-20 ENCOUNTER — Telehealth: Payer: Self-pay | Admitting: Hematology

## 2015-03-20 ENCOUNTER — Other Ambulatory Visit: Payer: Self-pay | Admitting: Family Medicine

## 2015-03-20 ENCOUNTER — Ambulatory Visit: Payer: Commercial Managed Care - HMO

## 2015-03-20 VITALS — BP 153/66 | HR 101 | Temp 98.1°F | Resp 18 | Ht 72.0 in | Wt 253.9 lb

## 2015-03-20 DIAGNOSIS — D472 Monoclonal gammopathy: Secondary | ICD-10-CM | POA: Insufficient documentation

## 2015-03-20 DIAGNOSIS — R779 Abnormality of plasma protein, unspecified: Secondary | ICD-10-CM

## 2015-03-20 DIAGNOSIS — E8809 Other disorders of plasma-protein metabolism, not elsewhere classified: Secondary | ICD-10-CM

## 2015-03-20 LAB — LACTATE DEHYDROGENASE (CC13): LDH: 132 U/L (ref 125–245)

## 2015-03-20 MED ORDER — OLMESARTAN MEDOXOMIL-HCTZ 40-25 MG PO TABS: ORAL_TABLET | ORAL | Status: DC

## 2015-03-20 NOTE — Progress Notes (Addendum)
[place]  [phone] Fax:([phone]) [place] Note   Patient Care Team: Marin Olp, MD as PCP - General (Family Medicine) 03/20/2015  CHIEF COMPLAINTS/PURPOSE OF CONSULTATION:  Abnormal SPEP   Referring physician: Garret Reddish, MD  HISTORY OF PRESENTING ILLNESS:  Caleb Horton 69 y.o. male with past medical history of hypertension is here because of abnormal SPEP.   He feels well, denies any pain, muscular or joint problem, dyspnea or chest symptoms. He has mild numberness on bottom of feet for 2-3 years, no tingling, weakness, or other neurological symptoms. He has very good energy level, appetite and eats very well. He is trying to lose some weight.    MEDICAL HISTORY:  Past Medical History  Diagnosis Date  . Hypertension   . Anemia   . INGROWN NAIL 11/10/2010    Annotation: bilateral  Qualifier: Diagnosis of  By: Arnoldo Morale MD, Balinda Quails     SURGICAL HISTORY: Past Surgical History  Procedure Laterality Date  . Circumcision      age 89    SOCIAL HISTORY: History   Social History  . Marital Status: Married    Spouse Name: N/A  . Number of Children: 5   . Years of Education: N/A   Occupational History   Retired IT consultant    Social History Main Topics  . Smoking status: Never Smoker   . Smokeless tobacco: Not on file  . Alcohol Use: 2.4 oz/week    4 Standard drinks or equivalent per week  . Drug Use: No  . Sexual Activity: Not on file   Other Topics Concern  . Not on file   Social History Narrative   Married (3rd marriage). 5 children (3 from 1st, 2 from second), 7 grandkids.       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 Hypertension Father     ALLERGIES:  has No Known Allergies.  MEDICATIONS:  Current Outpatient Prescriptions  Medication Sig Dispense Refill  .  atorvastatin (LIPITOR) 10 MG tablet Take 1 tablet (10 mg total) by mouth daily. 90 tablet 3  . ferrous sulfate 325 (65 FE) MG tablet Take 325 mg by mouth daily with breakfast.    . olmesartan-hydrochlorothiazide (BENICAR HCT) 40-25 MG per tablet TAKE 1 TABLET EVERY DAY 90 tablet 0   No current facility-administered medications for this visit.    REVIEW OF SYSTEMS:   Constitutional: Denies fevers, chills or abnormal night sweats Eyes: Denies blurriness of vision, double vision or watery eyes Ears, nose, mouth, throat, and face: Denies mucositis or sore throat Respiratory: Denies cough, dyspnea or wheezes Cardiovascular: Denies palpitation, chest discomfort or lower extremity swelling Gastrointestinal:  Denies nausea, heartburn or change in bowel habits Skin: Denies abnormal skin rashes Lymphatics: Denies new lymphadenopathy or easy bruising Neurological:Denies numbness, tingling or new weaknesses Behavioral/Psych: Mood is stable, no new changes  All other systems were reviewed with the patient and are negative.  PHYSICAL EXAMINATION: ECOG PERFORMANCE STATUS: 0 - Asymptomatic  Filed Vitals:   03/20/15 1046  BP: 153/66  Pulse: 101  Temp: 98.1 F (36.7 C)  Resp: 18   Filed Weights   03/20/15 1046  Weight: 253 lb 14.4 oz (115.168 kg)    GENERAL:alert, no distress and comfortable SKIN: skin color, texture, turgor are normal, no rashes or significant lesions EYES: normal,  conjunctiva are pink and non-injected, sclera clear OROPHARYNX:no exudate, no erythema and lips, buccal mucosa, and tongue normal  NECK: supple, thyroid normal size, non-tender, without nodularity LYMPH:  no palpable lymphadenopathy in the cervical, axillary or inguinal LUNGS: clear to auscultation and percussion with normal breathing effort HEART: regular rate & rhythm and no murmurs and no lower extremity edema ABDOMEN:abdomen soft, non-tender and normal bowel sounds Musculoskeletal:no cyanosis of digits and  no clubbing  PSYCH: alert & oriented x 3 with fluent speech NEURO: no focal motor/sensory deficits  LABORATORY DATA:  I have reviewed the data as listed Lab Results  Component Value Date   WBC 6.9 01/28/2015   HGB 13.3 01/28/2015   HCT 39.8 01/28/2015   MCV 84.3 01/28/2015   PLT 148.0* 01/28/2015    Recent Labs  05/17/14 1106 01/28/15 0912  NA 135 136  K 3.9 4.3  CL 103 104  CO2 27 29  GLUCOSE 108* 106*  BUN 29* 26*  CREATININE 1.9* 1.46  CALCIUM 10.1 10.5  PROT  --  8.8*  8.8*  ALBUMIN  --  3.9  3.9  AST  --  18  18  ALT  --  14  14  ALKPHOS  --  70  70  BILITOT  --  0.4  0.4  BILIDIR  --  0.1   Multiple myeloma panel, serum  Status: Finalresult Visible to patient:  MyChart Nextappt: Today at 11:30 AM in Oncology [facility] LAB 5) Dx:  Elevated blood protein          Notes Recorded by Marin Olp, MD on 02/10/2015 at 5:11 PM LVM for patient to return call. Refered to hematology for their opinion on elevated protein levels.     Ref Range 64moago    Total Protein, Serum Electrophoresis 6.1 - 8.1 g/dL 8.9 (H)   Albumin ELP 3.8 - 4.8 g/dL 4.1   Alpha-1-Globulin 0.2 - 0.3 g/dL 0.3   Alpha-2-Globulin 0.5 - 0.9 g/dL 0.6   Beta Globulin 0.4 - 0.6 g/dL 0.4   Beta 2 0.2 - 0.5 g/dL 0.4   Gamma Globulin 0.8 - 1.7 g/dL 3.2 (H)   Abnormal Protein Band1 g/dL NOT DET   SPE Interp.  SEE NOTE   Comments: The possibility of a faint restricted band(s) cannot be completely  excluded in the gamma region.  Reviewed by JOdis Hollingshead MD, PhD, FCAP (Electronic Signature on  File)     COMMENT (PROTEIN ELECTROPHOR)  SEE NOTE   Comments: ---------------  Serum protein electrophoresis is a useful screening procedure in the  detection of various pathophysiologic states such as inflammation,  gammopathies, protein loss and other dysproteinemias. Immunofixation  electrophoresis (IFE) is a more sensitive technique for the    identification of M-proteins found in patients with monoclonal  gammopathy of unknown significance (MGUS), amyloidosis, early or  treated myeloma or macroglobulinemia, solitary plasmacytoma or  extramedullary plasmacytoma.     IgG (Immunoglobin G), Serum 650 - 1600 mg/dL 3650 (H)   IgA 68 - 379 mg/dL 233   IgM, Serum 41 - 251 mg/dL 121   Immunofix Electr Int  SEE NOTE   Comments: No monoclonal protein identified.          Kappa/lambda light chains  Status: EditedResult-FINAL Visible to patient:  MyChart Nextappt: [facility] 11:30 AM [facility] Dx:  Elevated blood protein          Notes Recorded by SMarin Olp MD on 02/10/2015 at  5:11 PM LVM for patient to return call. Refered to hematology for their opinion on elevated protein levels.     Ref Range 62moago    Kappa free light chain 0.33 - 1.94 mg/dL 11.70 (H)   Lambda Free Lght Chn 0.57 - 2.63 mg/dL 5.78 (H)   Kappa:Lambda Ratio 0.26 - 1.65  2.02 (H)          RADIOGRAPHIC STUDIES: I have personally reviewed the radiological images as listed and agreed with the findings in the report. No results found.  ASSESSMENT & PLAN: 69year old African-American male  1. Gammopathy  -His SPEP and quantitative immunoglobulin showed elevated gamma globulin and IgG level, but no monoclonal protein was detected on immunofixation. Both his kappa and lambda light chain are elevated. This is more consistent with multiclonal gammopathy. I think MGUS or multiple myeloma is very unlikely. -I'll check his 24-hour urine for UPEP and immunofixation, and light chain levels in his urine. I'll also obtain a bone survey to ruled out lytic bone lesions.  -Although he does not have any clinical symptoms or signs of autoimmune disease, I'll check ANA, ANCa and RA, to ruled out autoimmune disease, which can contribute to multiclonal gammopathy. -I recommend him to repeat SPEP/IFE and light chain levels  in 6 months. If no monoclonal gammopathy detected on next visit. I'll discharge him from my clinic.  Plan -lab today -urine test and bone survey in the next few weeks -return to clinic in 6 months with repeated lab SPEP/IFE and light chain levels.   All questions were answered. The patient knows to call the clinic with any problems, questions or concerns. I spent 30 minutes counseling the patient face to face. The total time spent in the appointment was 40 minutes and more than 50% was on counseling.     FTruitt Merle MD 03/20/2015 11:07 AM    Addendum   I called patient back and discussed the lab test results with him. His repeat his SPEP and UPEP were negative for M protein,  He was found to have positive ANA (titer 1:160, speckled) and c-ANCA (titer 1:160).  He does not have any clinical symptoms for rheumatological disease,  I suggest him to follow-up with his primary care physician Dr. HYong Channel   I will see him back in 6 months.  FTruitt Merle

## 2015-03-20 NOTE — Telephone Encounter (Signed)
Gave and printed appt sched and avs for pt for DEC..I sent the Pt to lab

## 2015-03-20 NOTE — Progress Notes (Signed)
Checked in new pt with no financial concerns. °

## 2015-03-25 LAB — ANCA SCREEN W REFLEX TITER
ATYPICAL P-ANCA SCREEN: NEGATIVE
P-ANCA SCREEN: NEGATIVE
c-ANCA Screen: POSITIVE — AB

## 2015-03-25 LAB — SPEP & IFE WITH QIG
ALPHA-1-GLOBULIN: 0.3 g/dL (ref 0.2–0.3)
Albumin ELP: 4.3 g/dL (ref 3.8–4.8)
Alpha-2-Globulin: 0.6 g/dL (ref 0.5–0.9)
BETA 2: 0.4 g/dL (ref 0.2–0.5)
BETA GLOBULIN: 0.4 g/dL (ref 0.4–0.6)
Gamma Globulin: 3.2 g/dL — ABNORMAL HIGH (ref 0.8–1.7)
IGG (IMMUNOGLOBIN G), SERUM: 3470 mg/dL — AB (ref 650–1600)
IgA: 212 mg/dL (ref 68–379)
IgM, Serum: 121 mg/dL (ref 41–251)
Total Protein, Serum Electrophoresis: 9.1 g/dL — ABNORMAL HIGH (ref 6.1–8.1)

## 2015-03-25 LAB — KAPPA/LAMBDA LIGHT CHAINS
KAPPA LAMBDA RATIO: 1.61 (ref 0.26–1.65)
Kappa free light chain: 9.77 mg/dL — ABNORMAL HIGH (ref 0.33–1.94)
LAMBDA FREE LGHT CHN: 6.05 mg/dL — AB (ref 0.57–2.63)

## 2015-03-25 LAB — ANTI-NUCLEAR AB-TITER (ANA TITER)

## 2015-03-25 LAB — ANCA TITERS

## 2015-03-25 LAB — ANA: Anti Nuclear Antibody(ANA): POSITIVE — AB

## 2015-03-25 LAB — RHEUMATOID FACTOR: Rhuematoid fact SerPl-aCnc: <10 IU/mL (ref <=14)

## 2015-03-26 ENCOUNTER — Ambulatory Visit (HOSPITAL_COMMUNITY)
Admission: RE | Admit: 2015-03-26 | Discharge: 2015-03-26 | Disposition: A | Discharge status: Home / Self Care | Payer: Commercial Managed Care - HMO | Source: Ambulatory Visit | Attending: Hematology | Admitting: Hematology

## 2015-03-26 DIAGNOSIS — C9000 Multiple myeloma not having achieved remission: Secondary | ICD-10-CM | POA: Diagnosis not present

## 2015-03-26 DIAGNOSIS — M47892 Other spondylosis, cervical region: Secondary | ICD-10-CM | POA: Insufficient documentation

## 2015-03-26 DIAGNOSIS — D472 Monoclonal gammopathy: Secondary | ICD-10-CM

## 2015-03-28 LAB — UPEP/TP, 24-HR URINE
COLLECTION INTERVAL: 24 h
TOTAL PROTEIN, URINE: 8 mg/dL
TOTAL VOLUME, URINE: 1800 mL
Total Protein, Urine/Day: 144 mg/d — ABNORMAL HIGH (ref 50–100)

## 2015-03-28 LAB — UIFE/LIGHT CHAINS/TP QN, 24-HR UR
ALPHA 1 UR: DETECTED — AB
Albumin, U: DETECTED
Alpha 2, Urine: DETECTED — AB
BETA UR: DETECTED — AB
Gamma Globulin, Urine: DETECTED — AB
Time: 24 hours
Total Protein, Urine-Ur/day: 144 mg/d (ref <150)
Total Protein, Urine: 8 mg/dL (ref 5–25)
Volume, Urine: 1800 mL

## 2015-03-28 LAB — 24 HR URINE,KAPPA/LAMBDA LIGHT CHAINS
24H Urine Volume: 1800 mL/24 h
MEASURED KAPPA CHAIN: 2.74 mg/dL — AB (ref <2.00)
Measured Lambda Chain: 0.54 mg/dL (ref <2.00)
TOTAL KAPPA CHAIN: 49.32 mg/(24.h)
Total Lambda Chain: 9.72 mg/24 h

## 2015-04-18 ENCOUNTER — Encounter: Payer: Self-pay | Admitting: Internal Medicine

## 2015-05-05 ENCOUNTER — Telehealth: Payer: Self-pay | Admitting: Hematology

## 2015-05-05 NOTE — Addendum Note (Signed)
Addended by: Truitt Merle on: 05/05/2015 05:58 PM   Modules accepted: Orders

## 2015-05-15 ENCOUNTER — Encounter: Payer: Self-pay | Admitting: Gastroenterology

## 2015-05-18 NOTE — Telephone Encounter (Signed)
  I called patient back and discussed the lab test results with him. His repeat his SPEP and UPEP were negative for M protein, He was found to have positive ANA (titer 1:160, speckled) and c-ANCA (titer 1:160). He does not have any clinical symptoms for rheumatological disease, I suggest him to follow-up with his primary care physician Dr. Yong Channel.   I will see him back in 6 months.  Caleb Horton

## 2015-06-12 ENCOUNTER — Ambulatory Visit (AMBULATORY_SURGERY_CENTER): Payer: Self-pay | Admitting: *Deleted

## 2015-06-12 VITALS — Ht 72.0 in | Wt 248.6 lb

## 2015-06-12 DIAGNOSIS — Z86010 Personal history of colonic polyps: Secondary | ICD-10-CM

## 2015-06-12 NOTE — Progress Notes (Signed)
Denies allergies to eggs or soy products. Denies complications with sedation or anesthesia. Denies O2 use. Denies use of diet or weight loss medications.  Emmi instructions given for colonoscopy.  

## 2015-06-26 ENCOUNTER — Encounter: Payer: Self-pay | Admitting: Internal Medicine

## 2015-06-26 ENCOUNTER — Ambulatory Visit (AMBULATORY_SURGERY_CENTER): Payer: Commercial Managed Care - HMO | Admitting: Internal Medicine

## 2015-06-26 VITALS — BP 116/60 | HR 68 | Temp 96.2°F | Resp 18 | Ht 72.0 in | Wt 248.0 lb

## 2015-06-26 DIAGNOSIS — Z860100 Personal history of colon polyps, unspecified: Secondary | ICD-10-CM

## 2015-06-26 DIAGNOSIS — Z1211 Encounter for screening for malignant neoplasm of colon: Secondary | ICD-10-CM

## 2015-06-26 MED ORDER — SODIUM CHLORIDE 0.9 % IV SOLN: 500.0000 mL | INTRAVENOUS | Status: DC

## 2015-06-26 NOTE — Op Note (Signed)
[place]  [address]. [place], [id number]   COLONOSCOPY PROCEDURE REPORT  PATIENT: Caleb Horton, Caleb Horton  MR#: [id number] BIRTHDATE: 1945/10/26 , 49  yrs. old GENDER: male ENDOSCOPIST: Jerene Bears, MD REFERRED VC:BSWHQ Consuello Masse, M.D. PROCEDURE DATE:  06/26/2015 PROCEDURE:   Colonoscopy, surveillance First Screening Colonoscopy - Avg.  risk and is 50 yrs.  old or older - No.  Prior Negative Screening - Now for repeat screening. N/A  History of Adenoma - Now for follow-up colonoscopy & has been > or = to 3 yrs.  Yes hx of adenoma.  Has been 3 or more years since last colonoscopy.  Polyps removed today? No Recommend repeat exam, <10 yrs? Yes high risk ASA CLASS:   Class II INDICATIONS:Surveillance due to prior colonic neoplasia and PH Colon Adenoma (colon 2010 TV[facility], Dr. Sharlett Iles). MEDICATIONS: Monitored anesthesia care and Propofol 175 mg IV  DESCRIPTION OF PROCEDURE:   After the risks benefits and alternatives of the procedure were thoroughly explained, informed consent was obtained.  The digital rectal exam revealed external hemorrhoids.   The [id number]  endoscope was introduced through the anus and advanced to the cecum, which was identified by both the appendix and ileocecal valve. No adverse events experienced.   The quality of the prep was good.  (MiraLax was used)  The instrument was then slowly withdrawn as the colon was fully examined. Estimated blood loss is zero unless otherwise noted in this procedure report.    COLON FINDINGS: There was mild diverticulosis noted in the sigmoid colon.   The examination was otherwise normal.  No polyps or cancers seen.  Retroflexed views revealed external hemorrhoids. The time to cecum = 4.0 Withdrawal time = 8.6   The scope was withdrawn and the procedure completed.  COMPLICATIONS: There were no immediate complications.  ENDOSCOPIC IMPRESSION: 1.   Mild diverticulosis was noted in the sigmoid  colon 2.   The examination was otherwise normal  RECOMMENDATIONS: 1.  High fiber diet 2.  Repeat Colonoscopy in 5 years given history of tubulovillous adenoma.  eSigned:  Jerene Bears, MD 06/26/2015 9:32 AM   cc: Marin Olp and The Patient

## 2015-06-26 NOTE — Progress Notes (Signed)
Transferred to recovery room. A/O x3, pleased with MAC.  VSS.  Report to Karen, RN. 

## 2015-06-26 NOTE — Patient Instructions (Signed)
YOU HAD AN ENDOSCOPIC PROCEDURE TODAY AT THE [facility]:   Refer to the procedure report that was given to you for any specific questions about what was found during the examination.  If the procedure report does not answer your questions, please call your gastroenterologist to clarify.  If you requested that your care partner not be given the details of your procedure findings, then the procedure report has been included in a sealed envelope for you to review at your convenience later.  YOU SHOULD EXPECT: Some feelings of bloating in the abdomen. Passage of more gas than usual.  Walking can help get rid of the air that was put into your GI tract during the procedure and reduce the bloating. If you had a lower endoscopy (such as a colonoscopy or flexible sigmoidoscopy) you may notice spotting of blood in your stool or on the toilet paper. If you underwent a bowel prep for your procedure, you may not have a normal bowel movement for a few days.  Please Note:  You might notice some irritation and congestion in your nose or some drainage.  This is from the oxygen used during your procedure.  There is no need for concern and it should clear up in a day or so.  SYMPTOMS TO REPORT IMMEDIATELY:   Following lower endoscopy (colonoscopy or flexible sigmoidoscopy):  Excessive amounts of blood in the stool  Significant tenderness or worsening of abdominal pains  Swelling of the abdomen that is new, acute  Fever of 100F or higher   For urgent or emergent issues, a gastroenterologist can be reached at any hour by calling [phone].   DIET: Your first meal following the procedure should be a small meal and then it is ok to progress to your normal diet. Heavy or fried foods are harder to digest and may make you feel nauseous or bloated.  Likewise, meals heavy in dairy and vegetables can increase bloating.  Drink plenty of fluids but you should avoid alcoholic beverages for 24  hours.  ACTIVITY:  You should plan to take it easy for the rest of today and you should NOT DRIVE or use heavy machinery until tomorrow (because of the sedation medicines used during the test).    FOLLOW UP: Our staff will call the number listed on your records the next business day following your procedure to check on you and address any questions or concerns that you may have regarding the information given to you following your procedure. If we do not reach you, we will leave a message.  However, if you are feeling well and you are not experiencing any problems, there is no need to return our call.  We will assume that you have returned to your regular daily activities without incident.  If any biopsies were taken you will be contacted by phone or by letter within the next 1-3 weeks.  Please call us at [phone] if you have not heard about the biopsies in 3 weeks.    SIGNATURES/CONFIDENTIALITY: You and/or your care partner have signed paperwork which will be entered into your electronic medical record.  These signatures attest to the fact that that the information above on your After Visit Summary has been reviewed and is understood.  Full responsibility of the confidentiality of this discharge information lies with you and/or your care-partner. 

## 2015-06-27 ENCOUNTER — Telehealth: Payer: Self-pay | Admitting: *Deleted

## 2015-06-27 ENCOUNTER — Other Ambulatory Visit: Payer: Self-pay | Admitting: Family Medicine

## 2015-06-27 MED ORDER — OLMESARTAN MEDOXOMIL-HCTZ 40-25 MG PO TABS: ORAL_TABLET | ORAL | Status: DC

## 2015-06-27 NOTE — Telephone Encounter (Signed)
No answer, left message to call if questions or concerns. 

## 2015-07-07 ENCOUNTER — Telehealth: Payer: Self-pay | Admitting: Internal Medicine

## 2015-07-07 NOTE — Telephone Encounter (Signed)
Let pt know that pathology results will not post to mychart.

## 2015-08-05 ENCOUNTER — Ambulatory Visit (INDEPENDENT_AMBULATORY_CARE_PROVIDER_SITE_OTHER): Payer: Commercial Managed Care - HMO | Admitting: Family Medicine

## 2015-08-05 ENCOUNTER — Encounter: Payer: Self-pay | Admitting: Family Medicine

## 2015-08-05 VITALS — BP 138/82 | HR 78 | Temp 98.2°F | Wt 252.0 lb

## 2015-08-05 DIAGNOSIS — E785 Hyperlipidemia, unspecified: Secondary | ICD-10-CM

## 2015-08-05 DIAGNOSIS — N183 Chronic kidney disease, stage 3 unspecified: Secondary | ICD-10-CM

## 2015-08-05 DIAGNOSIS — I10 Essential (primary) hypertension: Secondary | ICD-10-CM

## 2015-08-05 DIAGNOSIS — Z86010 Personal history of colonic polyps: Secondary | ICD-10-CM | POA: Insufficient documentation

## 2015-08-05 LAB — COMPREHENSIVE METABOLIC PANEL
ALT: 13 U/L (ref 0–53)
AST: 17 U/L (ref 0–37)
Albumin: 4 g/dL (ref 3.5–5.2)
Alkaline Phosphatase: 70 U/L (ref 39–117)
BILIRUBIN TOTAL: 0.5 mg/dL (ref 0.2–1.2)
BUN: 21 mg/dL (ref 6–23)
CALCIUM: 10.3 mg/dL (ref 8.4–10.5)
CHLORIDE: 105 meq/L (ref 96–112)
CO2: 26 meq/L (ref 19–32)
Creatinine, Ser: 1.59 mg/dL — ABNORMAL HIGH (ref 0.40–1.50)
GFR: 55.94 mL/min — AB (ref >60.00)
GLUCOSE: 106 mg/dL — AB (ref 70–99)
POTASSIUM: 3.7 meq/L (ref 3.5–5.1)
Sodium: 138 mEq/L (ref 135–145)
Total Protein: 9.3 g/dL — ABNORMAL HIGH (ref 6.0–8.3)

## 2015-08-05 LAB — LDL CHOLESTEROL, DIRECT: LDL DIRECT: 82 mg/dL

## 2015-08-05 NOTE — Assessment & Plan Note (Signed)
S: last check showed GFR just above 60 thankfully but has had some variation. BP controlled, working to improve lipids A/P: check Cr/GFR today, hopeful for stability

## 2015-08-05 NOTE — Patient Instructions (Addendum)
Great job with weight loss of 8 lbs. Keep up the strong work.   This will reduce your chance of getting diabetes (we discussed mild increased risk at last visit)  Blood. pressure looks great  Check in on kidney and cholesterol with labs before you leave  Physical in 6 months or so (02/05/16 or later)

## 2015-08-05 NOTE — Assessment & Plan Note (Signed)
S: controlled. On Benicar-hctz 40-25 BP Readings from Last 3 Encounters:  08/05/15 138/82  06/26/15 116/60  03/20/15 153/66  A/P:Continue current meds as doing well

## 2015-08-05 NOTE — Assessment & Plan Note (Signed)
S: started atorvastatin 10mg  last visit, suspect improved control A/P: check direct LDL with goal <100, continue weight loss efforts as down 8 lbs from last visit.

## 2015-08-05 NOTE — Progress Notes (Signed)
Garret Reddish, MD  Subjective:  Caleb Horton is a 69 y.o. year old very pleasant male patient who presents for/with See problem oriented charting ROS- no chest pain or shortness of breath with gym activities, no headache or blurry vision, no edema  Past Medical History-  Patient Active Problem List   Diagnosis Date Noted  . Hyperlipidemia 09/04/2014    Priority: Medium  . CKD (chronic kidney disease), stage III 09/04/2014    Priority: Medium  . Essential hypertension 11/27/2007    Priority: Medium  . History of adenomatous polyp of colon 08/05/2015    Priority: Low  . Hyperglycemia 11/27/2007    Priority: Low  . Sickle-cell trait ([place]) 11/27/2007    Priority: Low  . BPH (benign prostatic hyperplasia) 11/27/2007    Priority: Low  . Gammopathy 03/20/2015  . Elevated blood protein 02/04/2015    Medications- reviewed and updated Current Outpatient Prescriptions  Medication Sig Dispense Refill  . atorvastatin (LIPITOR) 10 MG tablet Take 1 tablet (10 mg total) by mouth daily. 90 tablet 3  . olmesartan-hydrochlorothiazide (BENICAR HCT) 40-25 MG per tablet TAKE 1 TABLET EVERY DAY 90 tablet 2   No current facility-administered medications for this visit.    Objective: BP 138/82 mmHg  Pulse 78  Temp(Src) 98.2 F (36.8 C)  Wt 252 lb (114.306 kg) Gen: NAD, resting comfortably CV: RRR no murmurs rubs or gallops Lungs: CTAB no crackles, wheeze, rhonchi Abdomen: soft/nontender/nondistended/normal bowel sounds. Ext: no edema Skin: warm, dry Neuro: grossly normal, moves all extremities  Assessment/Plan:  Hyperlipidemia S: started atorvastatin 10mg  last visit, suspect improved control A/P: check direct LDL with goal <100, continue weight loss efforts as down 8 lbs from last visit.    Essential hypertension S: controlled. On Benicar-hctz 40-25 BP Readings from Last 3 Encounters:  08/05/15 138/82  06/26/15 116/60  03/20/15 153/66  A/P:Continue current meds as doing  well   CKD (chronic kidney disease), stage III S: last check showed GFR just above 60 thankfully but has had some variation. BP controlled, working to improve lipids A/P: check Cr/GFR today, hopeful for stability   Doing well with evaluation with hematology- has been reassuring with 6 month bloodwork recheck planned Exercising 45 minutes daily at senior center. Continue to check in about this CPE in 6 months  Orders Placed This Encounter  Procedures  . LDL cholesterol, direct    [place]  . Comprehensive metabolic panel    [place]

## 2015-09-19 ENCOUNTER — Ambulatory Visit (HOSPITAL_BASED_OUTPATIENT_CLINIC_OR_DEPARTMENT_OTHER): Payer: Commercial Managed Care - HMO | Admitting: Hematology

## 2015-09-19 ENCOUNTER — Other Ambulatory Visit (HOSPITAL_BASED_OUTPATIENT_CLINIC_OR_DEPARTMENT_OTHER): Payer: Commercial Managed Care - HMO

## 2015-09-19 ENCOUNTER — Telehealth: Payer: Self-pay | Admitting: Hematology

## 2015-09-19 ENCOUNTER — Encounter: Payer: Self-pay | Admitting: Hematology

## 2015-09-19 VITALS — BP 136/72 | HR 100 | Temp 98.3°F | Resp 18 | Ht 72.0 in | Wt 249.9 lb

## 2015-09-19 DIAGNOSIS — D472 Monoclonal gammopathy: Secondary | ICD-10-CM

## 2015-09-19 DIAGNOSIS — D696 Thrombocytopenia, unspecified: Secondary | ICD-10-CM

## 2015-09-19 LAB — COMPREHENSIVE METABOLIC PANEL
ALBUMIN: 4 g/dL (ref 3.5–5.0)
ALK PHOS: 85 U/L (ref 40–150)
ALT: 22 U/L (ref 0–55)
AST: 22 U/L (ref 5–34)
Anion Gap: 10 mEq/L (ref 3–11)
BILIRUBIN TOTAL: 0.63 mg/dL (ref 0.20–1.20)
BUN: 24.6 mg/dL (ref 7.0–26.0)
CO2: 21 mEq/L — ABNORMAL LOW (ref 22–29)
CREATININE: 1.5 mg/dL — AB (ref 0.7–1.3)
Calcium: 10.6 mg/dL — ABNORMAL HIGH (ref 8.4–10.4)
Chloride: 108 mEq/L (ref 98–109)
EGFR: 53 mL/min/{1.73_m2} — AB (ref >90)
GLUCOSE: 105 mg/dL (ref 70–140)
Potassium: 3.7 mEq/L (ref 3.5–5.1)
SODIUM: 138 meq/L (ref 136–145)
TOTAL PROTEIN: 10.1 g/dL — AB (ref 6.4–8.3)

## 2015-09-19 LAB — CBC WITH DIFFERENTIAL/PLATELET
BASO%: 0.3 % (ref 0.0–2.0)
BASOS ABS: 0 10*3/uL (ref 0.0–0.1)
EOS%: 2.2 % (ref 0.0–7.0)
Eosinophils Absolute: 0.2 10*3/uL (ref 0.0–0.5)
HCT: 41 % (ref 38.4–49.9)
HGB: 13.9 g/dL (ref 13.0–17.1)
LYMPH%: 30.6 % (ref 14.0–49.0)
MCH: 29 pg (ref 27.2–33.4)
MCHC: 33.9 g/dL (ref 32.0–36.0)
MCV: 85.4 fL (ref 79.3–98.0)
MONO#: 0.7 10*3/uL (ref 0.1–0.9)
MONO%: 9.9 % (ref 0.0–14.0)
NEUT#: 4.1 10*3/uL (ref 1.5–6.5)
NEUT%: 57 % (ref 39.0–75.0)
Platelets: 129 10*3/uL — ABNORMAL LOW (ref 140–400)
RBC: 4.8 10*6/uL (ref 4.20–5.82)
RDW: 14.3 % (ref 11.0–14.6)
WBC: 7.2 10*3/uL (ref 4.0–10.3)
lymph#: 2.2 10*3/uL (ref 0.9–3.3)

## 2015-09-19 NOTE — Telephone Encounter (Signed)
per pof to sch pt appt-gave pt copy of avs °

## 2015-09-19 NOTE — Progress Notes (Signed)
[place]  [phone] [phone]  Clinic Follow Up Note   Patient Care Team: Marin Olp, MD as PCP - General (Family Medicine) 09/19/2015  CHIEF COMPLAINTS:  Follow up gammopathy  HISTORY OF PRESENTING ILLNESS:  Caleb Horton 69 y.o. male with past medical history of hypertension is here because of abnormal SPEP.   He feels well, denies any pain, muscular or joint problem, dyspnea or chest symptoms. He has mild numberness on bottom of feet for 2-3 years, no tingling, weakness, or other neurological symptoms. He has very good energy level, appetite and eats very well. He is trying to lose some weight.   INTERIM HISTORY: Caleb Horton returns for follow-up. He is doing very well. He has mild left knee pain, but does not need any pain medication will has no limitation on his activities. He participates a senior group exercise program, and he goes to gym every day. He denies any other new pain, dyspnea, or other new symptoms. He has good appetite and eating well. His weight is stable.  MEDICAL HISTORY:  Past Medical History  Diagnosis Date  . Hypertension   . Anemia   . INGROWN NAIL 11/10/2010    Annotation: bilateral  Qualifier: Diagnosis of  By: Arnoldo Morale MD, Balinda Quails     SURGICAL HISTORY: Past Surgical History  Procedure Laterality Date  . Circumcision      age 46    SOCIAL HISTORY: History   Social History  . Marital Status: Married    Spouse Name: N/A  . Number of Children: 5   . Years of Education: N/A   Occupational History   Retired IT consultant    Social History Main Topics  . Smoking status: Never Smoker   . Smokeless tobacco: Not on file  . Alcohol Use: 2.4 oz/week    4 Standard drinks or equivalent per week  . Drug Use: No  . Sexual Activity: Not on file   Other Topics Concern  . Not on file   Social History Narrative   Married (3rd marriage). 5 children (3 from 1st, 2 from second), 7 grandkids.       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 Hypertension Father   . Colon cancer Neg Hx     ALLERGIES:  has No Known Allergies.  MEDICATIONS:  Current Outpatient Prescriptions  Medication Sig Dispense Refill  . atorvastatin (LIPITOR) 10 MG tablet Take 1 tablet (10 mg total) by mouth daily. 90 tablet 3  . olmesartan-hydrochlorothiazide (BENICAR HCT) 40-25 MG per tablet TAKE 1 TABLET EVERY DAY 90 tablet 2   No current facility-administered medications for this visit.    REVIEW OF SYSTEMS:   Constitutional: Denies fevers, chills or abnormal night sweats Eyes: Denies blurriness of vision, double vision or watery eyes Ears, nose, mouth, throat, and face: Denies mucositis or sore throat Respiratory: Denies cough, dyspnea or wheezes Cardiovascular: Denies palpitation, chest discomfort or lower extremity swelling Gastrointestinal:  Denies nausea, heartburn or change in bowel habits Skin: Denies abnormal skin rashes Lymphatics: Denies new lymphadenopathy or easy bruising Neurological:Denies numbness, tingling or new weaknesses Behavioral/Psych: Mood is stable, no new changes  All other systems were reviewed with the patient and are negative.  PHYSICAL EXAMINATION: ECOG PERFORMANCE STATUS: 0 - Asymptomatic  Filed Vitals:   09/19/15 1326  BP: 136/72  Pulse: 100  Temp: 98.3 F (36.8 C)  Resp: 18   Filed Weights   09/19/15 1326  Weight: 249 lb 14.4 oz (113.354 kg)    GENERAL:alert, no distress and comfortable SKIN: skin color, texture, turgor are normal, no rashes or significant lesions EYES: normal, conjunctiva are pink and non-injected, sclera clear OROPHARYNX:no exudate, no erythema and lips, buccal mucosa, and tongue normal  NECK: supple, thyroid normal size, non-tender, without nodularity LYMPH:  no palpable lymphadenopathy in the cervical, axillary or  inguinal LUNGS: clear to auscultation and percussion with normal breathing effort HEART: regular rate & rhythm and no murmurs and no lower extremity edema ABDOMEN:abdomen soft, non-tender and normal bowel sounds Musculoskeletal:no cyanosis of digits and no clubbing  PSYCH: alert & oriented x 3 with fluent speech NEURO: no focal motor/sensory deficits  LABORATORY DATA:  I have reviewed the data as listed CBC Latest Ref Rng 09/19/2015 01/28/2015 09/17/2013  WBC 4.0 - 10.3 10e3/uL 7.2 6.9 6.0  Hemoglobin 13.0 - 17.1 g/dL 13.9 13.3 13.1  Hematocrit 38.4 - 49.9 % 41.0 39.8 39.4  Platelets 140 - 400 10e3/uL 129(L) 148.0(L) 127.0(L)    CMP Latest Ref Rng 09/19/2015 08/05/2015 01/28/2015  Glucose 70 - 140 mg/dl 105 106(H) -  BUN 7.0 - 26.0 mg/dL 24.6 21 -  Creatinine 0.7 - 1.3 mg/dL 1.5(H) 1.59(H) -  Sodium 136 - 145 mEq/L 138 138 -  Potassium 3.5 - 5.1 mEq/L 3.7 3.7 -  Chloride 96 - 112 mEq/L - 105 -  CO2 22 - 29 mEq/L 21(L) 26 -  Calcium 8.4 - 10.4 mg/dL 10.6(H) 10.3 -  Total Protein 6.4 - 8.3 g/dL 10.1(H) 9.3(H) 8.8(H)  Total Bilirubin 0.20 - 1.20 mg/dL 0.63 0.5 0.4  Alkaline Phos 40 - 150 U/L 85 70 70  AST 5 - 34 U/L '22 17 18  ' ALT 0 - 55 U/L '22 13 14   ' SPEP/IFE and light chain levels from today are still pending    RADIOGRAPHIC STUDIES: I have personally reviewed the radiological images as listed and agreed with the findings in the report.  Bone survey 03/26/2015 IMPRESSION: No radiolucent bony lesions are seen. Degenerative changes are noted in the lower cervical spine and throughout the thoracic spine.   ASSESSMENT & PLAN: 69 year old African-American male  1. 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 Both his kappa and lambda light chain are elevated. His 24-hour urine was also negative for Keane Scrape protein. This is more consistent with  multiclonal gammopathy. I think MGUS or multiple myeloma is very unlikely. -His bone survey was negative -His lab June 2016 also reviewed positive ANA and cANCa, he does not have any clinical signs of rheumatological disease, I encourage him to discuss with his primary care physician Dr. Yong Channel, and to see if he needs a referral to see a rheumatologist -I reviewed the repeated lab results today, his CBC is normal, his CMP revealed high total protein. SPEP with immunofixation and light chain levels are still pending.   2. Mild thrombocytopenia -He has had mild thrombocytopenia with platelet in 120-150 range for the past 3 months.  -Giving his positive ANA and cANCA, his thrombocytopenia could be immune related. -We'll follow up clinically. I do not feel he needs further workup at this point.    Plan -return to clinic in 6 months with repeated CBC, SPEP/IFE and light chain levels. -he will discuss with Dr. Yong Channel about rheumatology  referral -I will call him about today's SPEP result   All questions were answered. The patient knows to call the clinic with any problems, questions or concerns. I spent 15 minutes counseling the patient face to face. The total time spent in the appointment was 20 minutes and more than 50% was on counseling.     Truitt Merle, MD 09/19/2015 2:41 PM

## 2015-09-23 LAB — SPEP & IFE WITH QIG
ALPHA-2-GLOBULIN: 0.7 g/dL (ref 0.5–0.9)
Albumin ELP: 4.3 g/dL (ref 3.8–4.8)
Alpha-1-Globulin: 0.3 g/dL (ref 0.2–0.3)
BETA GLOBULIN: 0.4 g/dL (ref 0.4–0.6)
Beta 2: 0.4 g/dL (ref 0.2–0.5)
Gamma Globulin: 3.2 g/dL — ABNORMAL HIGH (ref 0.8–1.7)
IgA: 227 mg/dL (ref 68–379)
IgG (Immunoglobin G), Serum: 2870 mg/dL — ABNORMAL HIGH (ref 650–1600)
IgM, Serum: 155 mg/dL (ref 41–251)
Total Protein, Serum Electrophoresis: 9.3 g/dL — ABNORMAL HIGH (ref 6.1–8.1)

## 2015-09-23 LAB — KAPPA/LAMBDA LIGHT CHAINS
Kappa free light chain: 8.49 mg/dL — ABNORMAL HIGH (ref 0.33–1.94)
Kappa:Lambda Ratio: 1.72 — ABNORMAL HIGH (ref 0.26–1.65)
LAMBDA FREE LGHT CHN: 4.95 mg/dL — AB (ref 0.57–2.63)

## 2015-11-03 ENCOUNTER — Telehealth: Payer: Self-pay | Admitting: Family Medicine

## 2015-11-03 MED ORDER — ATORVASTATIN CALCIUM 10 MG PO TABS: 10.0000 mg | ORAL_TABLET | Freq: Every day | ORAL | Status: DC

## 2015-11-03 NOTE — Telephone Encounter (Signed)
Medication sent in to [facility].

## 2015-11-03 NOTE — Telephone Encounter (Signed)
Pt request refill of the following:   atorvastatin (LIPITOR) 10 MG tablet    Pt said [facility] is his new pharmacy    Phamacy: [facility]

## 2016-01-01 ENCOUNTER — Other Ambulatory Visit: Payer: Self-pay | Admitting: General Practice

## 2016-01-01 ENCOUNTER — Other Ambulatory Visit: Payer: Self-pay | Admitting: Family Medicine

## 2016-01-01 MED ORDER — OLMESARTAN MEDOXOMIL-HCTZ 40-25 MG PO TABS: ORAL_TABLET | ORAL | Status: DC

## 2016-01-01 NOTE — Telephone Encounter (Signed)
Pt has switched insurance and needs new rx olmesartan-hydrochlorothiazide (BENICAR HCT) 40-25 MG per tablet 90 day  Send to [facility]

## 2016-01-01 NOTE — Telephone Encounter (Signed)
Left message on voicemail Rx sent to [facility] as requested. Any questions please call office.

## 2016-01-29 ENCOUNTER — Other Ambulatory Visit (INDEPENDENT_AMBULATORY_CARE_PROVIDER_SITE_OTHER): Payer: Medicare Other

## 2016-01-29 DIAGNOSIS — Z125 Encounter for screening for malignant neoplasm of prostate: Secondary | ICD-10-CM

## 2016-01-29 DIAGNOSIS — R319 Hematuria, unspecified: Secondary | ICD-10-CM | POA: Diagnosis not present

## 2016-01-29 DIAGNOSIS — Z0000 Encounter for general adult medical examination without abnormal findings: Secondary | ICD-10-CM | POA: Diagnosis not present

## 2016-01-29 LAB — BASIC METABOLIC PANEL
BUN: 35 mg/dL — ABNORMAL HIGH (ref 6–23)
CALCIUM: 10.2 mg/dL (ref 8.4–10.5)
CO2: 25 meq/L (ref 19–32)
CREATININE: 1.6 mg/dL — AB (ref 0.40–1.50)
Chloride: 104 mEq/L (ref 96–112)
GFR: 55.46 mL/min — ABNORMAL LOW (ref >60.00)
GLUCOSE: 107 mg/dL — AB (ref 70–99)
Potassium: 3.9 mEq/L (ref 3.5–5.1)
Sodium: 135 mEq/L (ref 135–145)

## 2016-01-29 LAB — HEPATIC FUNCTION PANEL
ALBUMIN: 4 g/dL (ref 3.5–5.2)
ALK PHOS: 70 U/L (ref 39–117)
ALT: 25 U/L (ref 0–53)
AST: 24 U/L (ref 0–37)
Bilirubin, Direct: 0.2 mg/dL (ref 0.0–0.3)
TOTAL PROTEIN: 9.3 g/dL — AB (ref 6.0–8.3)
Total Bilirubin: 0.5 mg/dL (ref 0.2–1.2)

## 2016-01-29 LAB — POC URINALSYSI DIPSTICK (AUTOMATED)
Bilirubin, UA: NEGATIVE
GLUCOSE UA: NEGATIVE
Ketones, UA: NEGATIVE
Leukocytes, UA: NEGATIVE
NITRITE UA: NEGATIVE
PROTEIN UA: NEGATIVE
Spec Grav, UA: 1.015
UROBILINOGEN UA: 0.2
pH, UA: 5.5

## 2016-01-29 LAB — CBC WITH DIFFERENTIAL/PLATELET
BASOS ABS: 0 10*3/uL (ref 0.0–0.1)
Basophils Relative: 0.5 % (ref 0.0–3.0)
EOS ABS: 0.2 10*3/uL (ref 0.0–0.7)
Eosinophils Relative: 3.2 % (ref 0.0–5.0)
HCT: 40.7 % (ref 39.0–52.0)
Hemoglobin: 13.8 g/dL (ref 13.0–17.0)
LYMPHS ABS: 2.6 10*3/uL (ref 0.7–4.0)
Lymphocytes Relative: 36.9 % (ref 12.0–46.0)
MCHC: 33.9 g/dL (ref 30.0–36.0)
MCV: 84.1 fl (ref 78.0–100.0)
MONO ABS: 0.9 10*3/uL (ref 0.1–1.0)
Monocytes Relative: 13.1 % — ABNORMAL HIGH (ref 3.0–12.0)
NEUTROS ABS: 3.3 10*3/uL (ref 1.4–7.7)
NEUTROS PCT: 46.3 % (ref 43.0–77.0)
PLATELETS: 156 10*3/uL (ref 150.0–400.0)
RBC: 4.84 Mil/uL (ref 4.22–5.81)
RDW: 14.5 % (ref 11.5–15.5)
WBC: 7.1 10*3/uL (ref 4.0–10.5)

## 2016-01-29 LAB — URINALYSIS, MICROSCOPIC ONLY: RBC / HPF: NONE SEEN (ref 0–?)

## 2016-01-29 LAB — LIPID PANEL
Cholesterol: 134 mg/dL (ref 0–200)
HDL: 37.1 mg/dL — AB (ref >39.00)
LDL Cholesterol: 83 mg/dL (ref 0–99)
NONHDL: 97.04
TRIGLYCERIDES: 69 mg/dL (ref 0.0–149.0)
Total CHOL/HDL Ratio: 4
VLDL: 13.8 mg/dL (ref 0.0–40.0)

## 2016-01-29 LAB — PSA: PSA: 3.88 ng/mL (ref 0.10–4.00)

## 2016-01-29 LAB — TSH: TSH: 2.7 u[IU]/mL (ref 0.35–4.50)

## 2016-02-05 ENCOUNTER — Encounter: Payer: Self-pay | Admitting: Family Medicine

## 2016-02-05 ENCOUNTER — Ambulatory Visit (INDEPENDENT_AMBULATORY_CARE_PROVIDER_SITE_OTHER): Payer: Medicare Other | Admitting: Family Medicine

## 2016-02-05 VITALS — BP 118/72 | HR 83 | Temp 98.6°F | Ht 72.0 in | Wt 253.0 lb

## 2016-02-05 DIAGNOSIS — D696 Thrombocytopenia, unspecified: Secondary | ICD-10-CM

## 2016-02-05 DIAGNOSIS — E785 Hyperlipidemia, unspecified: Secondary | ICD-10-CM | POA: Diagnosis not present

## 2016-02-05 DIAGNOSIS — I10 Essential (primary) hypertension: Secondary | ICD-10-CM

## 2016-02-05 DIAGNOSIS — D472 Monoclonal gammopathy: Secondary | ICD-10-CM

## 2016-02-05 DIAGNOSIS — Z0000 Encounter for general adult medical examination without abnormal findings: Secondary | ICD-10-CM

## 2016-02-05 DIAGNOSIS — N183 Chronic kidney disease, stage 3 unspecified: Secondary | ICD-10-CM

## 2016-02-05 DIAGNOSIS — R972 Elevated prostate specific antigen [PSA]: Secondary | ICD-10-CM | POA: Diagnosis not present

## 2016-02-05 DIAGNOSIS — D573 Sickle-cell trait: Secondary | ICD-10-CM

## 2016-02-05 MED ORDER — TETANUS-DIPHTH-ACELL PERTUSSIS 5-2.5-18.5 LF-MCG/0.5 IM SUSP: 0.5000 mL | Freq: Once | INTRAMUSCULAR | Status: DC

## 2016-02-05 NOTE — Patient Instructions (Addendum)
Overall things look great- 3 concerns 1. Sugar level is up slightly with weight gain- have to reverse this trend 2. PSA is up some but I think this is just due to your large prostate. Return in 6 months for labs to have this checked in lab- although you could also see me at that time if you would like 3. Check with pharmacy about getting tetanus shot since insurance doesn't cover [place] giving it to you in office

## 2016-02-05 NOTE — Progress Notes (Signed)
Phone: [phone]  Subjective:  Patient presents today for their annual physical. Chief complaint-noted.   See problem oriented charting- ROS- full  review of systems was completed and negative including No chest pain or shortness of breath. No headache or blurry vision.   The following were reviewed and entered/updated in epic: Past Medical History  Diagnosis Date  . Hypertension   . Anemia   . INGROWN NAIL 11/10/2010    Annotation: bilateral  Qualifier: Diagnosis of  By: Arnoldo Morale MD, Balinda Quails    Patient Active Problem List   Diagnosis Date Noted  . Hyperlipidemia 09/04/2014    Priority: Medium  . CKD (chronic kidney disease), stage III 09/04/2014    Priority: Medium  . Essential hypertension 11/27/2007    Priority: Medium  . History of adenomatous polyp of colon 08/05/2015    Priority: Low  . Hyperglycemia 11/27/2007    Priority: Low  . Sickle-cell trait ([place]) 11/27/2007    Priority: Low  . BPH (benign prostatic hyperplasia) 11/27/2007    Priority: Low  . Thrombocytopenia ([place]) 09/19/2015  . Gammopathy 03/20/2015  . Elevated blood protein 02/04/2015   Past Surgical History  Procedure Laterality Date  . Circumcision      age 11    Family History  Problem Relation Age of Onset  . Hypertension Father   . Colon cancer Neg Hx     Medications- reviewed and updated Current Outpatient Prescriptions  Medication Sig Dispense Refill  . atorvastatin (LIPITOR) 10 MG tablet Take 1 tablet (10 mg total) by mouth daily. 90 tablet 3  . olmesartan-hydrochlorothiazide (BENICAR HCT) 40-25 MG tablet TAKE 1 TABLET EVERY DAY 90 tablet 1   No current facility-administered medications for this visit.    Allergies-reviewed and updated No Known Allergies  Social History   Social History  . Marital Status: Married    Spouse Name: N/A  . Number of Children: N/A  . Years of Education: N/A   Social History Main Topics  . Smoking status: Former Smoker -- 0.50 packs/day for 3  years    Quit date: 10/11/1968  . Smokeless tobacco: Never Used  . Alcohol Use: 3.0 oz/week    1 Shots of liquor, 4 Standard drinks or equivalent per week     Comment: once a week   . Drug Use: No  . Sexual Activity: Not Asked   Other Topics Concern  . None   Social History Narrative   Married (3rd marriage). 5 children (3 from 1st, 2 from second), 7 grandkids.       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 Oropharynx normal Neck: no thyromegaly CV: RRR no murmurs rubs or gallops Lungs: CTAB no crackles, wheeze, rhonchi Abdomen: soft/nontender/nondistended/normal bowel sounds. No rebound or guarding.  Ext: no edema Skin: warm, dry Neuro: grossly normal, moves all extremities, PERRLA  Assessment/Plan:  70 y.o. male presenting for annual physical.  Health Maintenance counseling: 1. Anticipatory guidance: Patient counseled regarding regular dental exams, eye exams, wearing seatbelts.  2. Risk factor reduction:  Advised patient of need for regular exercise and diet rich and fruits and vegetables to reduce risk of heart attack and stroke. Still doing regular exercise with [employer].  Weight creeping up- knows he needs to eat better.  Wt Readings from Last 3 Encounters:  02/05/16 253 lb (114.76 kg)  09/19/15 249 lb 14.4 oz (113.354 kg)  08/05/15 252 lb (114.306 kg)  3. Immunizations/screenings/ancillary studies Immunization History  Administered Date(s) Administered  . Influenza Split 08/15/2012  . Influenza, High Dose Seasonal PF 07/14/2013, 07/05/2014  . Influenza-Unspecified 07/20/2015  . Pneumococcal Conjugate-13 07/05/2014  . Pneumococcal Polysaccharide-23 02/12/2013  . Td 10/11/2005  . Zoster 02/04/2015   Health  Maintenance Due  Topic Date Due  . Samul Dada - will take to pharmacy, printed rx 10/12/2015   4. Prostate cancer screening- BPH on exam- likely cause, we will recheck in 6 months given trend upwards  Lab Results  Component Value Date   PSA 3.88 01/29/2016   PSA 3.04 01/28/2015   PSA 3.23 09/17/2013  5. Colon cancer screening - 06/26/15 with 5 year follow up  HTN - controlled on benicar-hctz 40-25 . No change to meds.  BP Readings from Last 3 Encounters:  02/05/16 118/72  09/19/15 136/72  08/05/15 138/82   HLD-  well controlled on atorvastatin 10mg . No myalgias.  Lab Results  Component Value Date   CHOL 134 01/29/2016   HDL 37.10* 01/29/2016   LDLCALC 83 01/29/2016   LDLDIRECT 82.0 08/05/2015   TRIG 69.0 01/29/2016   CHOLHDL 4 01/29/2016   CKD III- stable with GFR in 69s. Continue ace-i and BP control  Has sickle cell trait- no history of clinical consequences. Continue to follow  Thrombocytopenia- stable on last check  Gammopathy- protein actually improved- has labs with oncology within next 3 months.   Return in about 6 months (around 08/06/2016). can do PSA before visit but if does at time of visit would also update GFR Return precautions advised.   Orders Placed This Encounter  Procedures  . PSA    Standing Status: Future     Number of Occurrences:      Standing Expiration Date: 02/04/2017    Meds ordered this encounter  Medications  . Tdap (BOOSTRIX) 5-2.5-18.5 LF-MCG/0.5 injection    Sig: Inject 0.5 mLs into the muscle once.    Dispense:  0.5 mL    Refill:  0    Garret Reddish, MD

## 2016-03-12 ENCOUNTER — Other Ambulatory Visit (HOSPITAL_BASED_OUTPATIENT_CLINIC_OR_DEPARTMENT_OTHER): Payer: Medicare Other

## 2016-03-12 ENCOUNTER — Other Ambulatory Visit: Payer: Self-pay | Admitting: *Deleted

## 2016-03-12 DIAGNOSIS — D472 Monoclonal gammopathy: Secondary | ICD-10-CM

## 2016-03-12 LAB — CBC WITH DIFFERENTIAL/PLATELET
BASO%: 0.5 % (ref 0.0–2.0)
Basophils Absolute: 0 10*3/uL (ref 0.0–0.1)
EOS ABS: 0.4 10*3/uL (ref 0.0–0.5)
EOS%: 5.7 % (ref 0.0–7.0)
HCT: 41.4 % (ref 38.4–49.9)
HEMOGLOBIN: 13.6 g/dL (ref 13.0–17.1)
LYMPH%: 28.6 % (ref 14.0–49.0)
MCH: 27.6 pg (ref 27.2–33.4)
MCHC: 32.8 g/dL (ref 32.0–36.0)
MCV: 84.2 fL (ref 79.3–98.0)
MONO#: 0.7 10*3/uL (ref 0.1–0.9)
MONO%: 10.3 % (ref 0.0–14.0)
NEUT%: 54.9 % (ref 39.0–75.0)
NEUTROS ABS: 3.9 10*3/uL (ref 1.5–6.5)
Platelets: 142 10*3/uL (ref 140–400)
RBC: 4.91 10*6/uL (ref 4.20–5.82)
RDW: 14.5 % (ref 11.0–14.6)
WBC: 7 10*3/uL (ref 4.0–10.3)
lymph#: 2 10*3/uL (ref 0.9–3.3)

## 2016-03-12 LAB — COMPREHENSIVE METABOLIC PANEL
ALBUMIN: 3.9 g/dL (ref 3.5–5.0)
ALK PHOS: 73 U/L (ref 40–150)
ALT: 26 U/L (ref 0–55)
AST: 26 U/L (ref 5–34)
Anion Gap: 7 mEq/L (ref 3–11)
BILIRUBIN TOTAL: 0.86 mg/dL (ref 0.20–1.20)
BUN: 26.8 mg/dL — AB (ref 7.0–26.0)
CO2: 25 meq/L (ref 22–29)
CREATININE: 1.8 mg/dL — AB (ref 0.7–1.3)
Calcium: 10.1 mg/dL (ref 8.4–10.4)
Chloride: 107 mEq/L (ref 98–109)
EGFR: 42 mL/min/{1.73_m2} — AB (ref >90)
GLUCOSE: 101 mg/dL (ref 70–140)
Potassium: 4 mEq/L (ref 3.5–5.1)
SODIUM: 138 meq/L (ref 136–145)
TOTAL PROTEIN: 9.9 g/dL — AB (ref 6.4–8.3)

## 2016-03-16 LAB — PROTEIN ELECTROPHORESIS, SERUM, WITH REFLEX
A/G Ratio: 0.7 (ref 0.7–1.7)
ALPHA 2: 0.7 g/dL (ref 0.4–1.0)
Albumin: 3.6 g/dL (ref 2.9–4.4)
Alpha 1: 0.2 g/dL (ref 0.0–0.4)
BETA: 1 g/dL (ref 0.7–1.3)
GAMMA GLOBULIN: 3.5 g/dL — AB (ref 0.4–1.8)
GLOBULIN, TOTAL: 5.4 g/dL — AB (ref 2.2–3.9)
Total Protein: 9 g/dL — ABNORMAL HIGH (ref 6.0–8.5)

## 2016-03-16 LAB — KAPPA/LAMBDA LIGHT CHAINS
IG KAPPA FREE LIGHT CHAIN: 126.09 mg/L — AB (ref 3.30–19.40)
IG LAMBDA FREE LIGHT CHAIN: 62.7 mg/L — AB (ref 5.71–26.30)
Kappa/Lambda FluidC Ratio: 2.01 — ABNORMAL HIGH (ref 0.26–1.65)

## 2016-03-19 ENCOUNTER — Ambulatory Visit (HOSPITAL_BASED_OUTPATIENT_CLINIC_OR_DEPARTMENT_OTHER): Payer: Medicare Other | Admitting: Hematology

## 2016-03-19 ENCOUNTER — Encounter: Payer: Self-pay | Admitting: Hematology

## 2016-03-19 VITALS — BP 131/84 | HR 92 | Temp 98.5°F | Resp 18 | Ht 72.0 in | Wt 254.4 lb

## 2016-03-19 DIAGNOSIS — D472 Monoclonal gammopathy: Secondary | ICD-10-CM

## 2016-03-19 DIAGNOSIS — D696 Thrombocytopenia, unspecified: Secondary | ICD-10-CM | POA: Diagnosis not present

## 2016-03-19 DIAGNOSIS — I10 Essential (primary) hypertension: Secondary | ICD-10-CM | POA: Diagnosis not present

## 2016-03-19 DIAGNOSIS — N183 Chronic kidney disease, stage 3 (moderate): Secondary | ICD-10-CM

## 2016-03-19 NOTE — Progress Notes (Signed)
[place]  [phone] [phone]  Clinic Follow Up Note   Patient Care Team: Marin Olp, MD as PCP - General (Family Medicine) 03/19/2016  CHIEF COMPLAINTS:  Follow up gammopathy  HISTORY OF PRESENTING ILLNESS:  Caleb Horton 70 y.o. male with past medical history of hypertension is here because of abnormal SPEP.   He feels well, denies any pain, muscular or joint problem, dyspnea or chest symptoms. He has mild numberness on bottom of feet for 2-3 years, no tingling, weakness, or other neurological symptoms. He has very good energy level, appetite and eats very well. He is trying to lose some weight.   INTERIM HISTORY: Mccormick returns for follow-up.  He is doing well He denies any significant pain except mild and chronic left knee pain, which is stable overall. He does not take any pain medication. He has very good appetite and energy level, denies any other symptoms.  MEDICAL HISTORY:  Past Medical History  Diagnosis Date  . Hypertension   . Anemia   . INGROWN NAIL 11/10/2010    Annotation: bilateral  Qualifier: Diagnosis of  By: Arnoldo Morale MD, Balinda Quails     SURGICAL HISTORY: Past Surgical History  Procedure Laterality Date  . Circumcision      age 70    SOCIAL HISTORY: History   Social History  . Marital Status: Married    Spouse Name: N/A  . Number of Children: 5   . Years of Education: N/A   Occupational History   Retired IT consultant    Social History Main Topics  . Smoking status: Never Smoker   . Smokeless tobacco: Not on file  . Alcohol Use: 2.4 oz/week    4 Standard drinks or equivalent per week  . Drug Use: No  . Sexual Activity: Not on file   Other Topics Concern  . Not on file   Social History Narrative   Married (3rd marriage). 5 children (3 from 1st, 2 from second), 7 grandkids.       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 Hypertension Father   . Colon cancer Neg Hx     ALLERGIES:  has No Known Allergies.  MEDICATIONS:  Current Outpatient Prescriptions  Medication Sig Dispense Refill  . atorvastatin (LIPITOR) 10 MG tablet Take 1 tablet (10 mg total) by mouth daily. 90 tablet 3  . olmesartan-hydrochlorothiazide (BENICAR HCT) 40-25 MG tablet TAKE 1 TABLET EVERY DAY 90 tablet 1  . Tdap (BOOSTRIX) 5-2.5-18.5 LF-MCG/0.5 injection Inject 0.5 mLs into the muscle once. 0.5 mL 0   No current facility-administered medications for this visit.    REVIEW OF SYSTEMS:   Constitutional: Denies fevers, chills or abnormal night sweats Eyes: Denies blurriness of vision, double vision or watery eyes Ears, nose, mouth, throat, and face: Denies mucositis or sore throat Respiratory: Denies cough, dyspnea or wheezes Cardiovascular: Denies palpitation, chest discomfort or lower extremity swelling Gastrointestinal:  Denies nausea, heartburn or change in bowel habits Skin: Denies abnormal skin rashes Lymphatics: Denies new lymphadenopathy or easy bruising Neurological:Denies numbness, tingling or new weaknesses Behavioral/Psych: Mood is stable, no new changes  All other systems were reviewed with the patient and are negative.  PHYSICAL EXAMINATION: ECOG PERFORMANCE STATUS: 0 - Asymptomatic  Filed Vitals:   03/19/16 1239  BP: 131/84  Pulse: 92  Temp: 98.5 F (36.9  C)  Resp: 18   Filed Weights   03/19/16 1239  Weight: 254 lb 6.4 oz (115.395 kg)    GENERAL:alert, no distress and comfortable SKIN: skin color, texture, turgor are normal, no rashes or significant lesions EYES: normal, conjunctiva are pink and non-injected, sclera clear OROPHARYNX:no exudate, no erythema and lips, buccal mucosa, and tongue normal  NECK: supple, thyroid normal size, non-tender, without nodularity LYMPH:  no palpable lymphadenopathy in the cervical, axillary or inguinal LUNGS:  clear to auscultation and percussion with normal breathing effort HEART: regular rate & rhythm and no murmurs and no lower extremity edema ABDOMEN:abdomen soft, non-tender and normal bowel sounds Musculoskeletal:no cyanosis of digits and no clubbing  PSYCH: alert & oriented x 3 with fluent speech NEURO: no focal motor/sensory deficits  LABORATORY DATA:  I have reviewed the data as listed CBC Latest Ref Rng 03/12/2016 01/29/2016 09/19/2015  WBC 4.0 - 10.3 10e3/uL 7.0 7.1 7.2  Hemoglobin 13.0 - 17.1 g/dL 13.6 13.8 13.9  Hematocrit 38.4 - 49.9 % 41.4 40.7 41.0  Platelets 140 - 400 10e3/uL 142 156.0 129(L)    CMP Latest Ref Rng 03/12/2016 03/12/2016 01/29/2016  Glucose 70 - 140 mg/dl 101 - 107(H)  BUN 7.0 - 26.0 mg/dL 26.8(H) - 35(H)  Creatinine 0.7 - 1.3 mg/dL 1.8(H) - 1.60(H)  Sodium 136 - 145 mEq/L 138 - 135  Potassium 3.5 - 5.1 mEq/L 4.0 - 3.9  Chloride 96 - 112 mEq/L - - 104  CO2 22 - 29 mEq/L 25 - 25  Calcium 8.4 - 10.4 mg/dL 10.1 - 10.2  Total Protein 6.0 - 8.5 g/dL 9.9(H) 9.0(H) 9.3(H)  Total Bilirubin 0.20 - 1.20 mg/dL 0.86 - 0.5  Alkaline Phos 40 - 150 U/L 73 - 70  AST 5 - 34 U/L 26 - 24  ALT 0 - 55 U/L 26 - 25   SPEP with reflex to IFE  Status: Finalresult Visible to patient:  Not Released Nextappt: Today at 01:15 PM in Oncology [facility], Krista Blue, MD) Dx:  Gammopathy              Ref Range 7d ago (03/12/16) 7d ago (03/12/16) 40moago (01/29/16) 647mogo (09/19/15) 92m25moo (09/19/15)    Total Protein 6.0 - 8.5 g/dL 9.0 (H)        Albumin 2.9 - 4.4 g/dL 3.6 3.9R 4.0R 4.0R     Alpha 1 0.0 - 0.4 g/dL 0.2        Alpha 2 0.4 - 1.0 g/dL 0.7        Beta 0.7 - 1.3 g/dL 1.0        Gamma Globulin 0.4 - 1.8 g/dL 3.5 (H)    3.2 (H)R    M-Spike, % Not Observed g/dL Not Observed        GLOBULIN, TOTAL 2.2 - 3.9 g/dL 5.4 (H)        A/G Ratio 0.7 - 1.7  0.7            Kappa/lambda light chains  Status: Finalresult Visible to patient:  Not Released Nextappt:  Today at 01:15 PM in Oncology ([facility]) Dx:  Gammopathy            Ref Range 7d ago    Ig Kappa Free Light Chain 3.30 - 19.40 mg/L 126.09 (H)   Comments:        **Effective March 15, 2016 Free Kappa Lt Chains,S**          reference interval will be changing to:  3.3 - 19.4     Ig Lambda Free Light Chain 5.71 - 26.30 mg/L 62.70 (H)   Comments:        **Effective March 15, 2016 Free Lambda Lt Chains,S**          reference interval will be changing to:  5.7 - 26.3     Kappa/Lambda FluidC Ratio 0.26 - 1.65  2.01 (H)   Resulting Agency [facility]           RADIOGRAPHIC STUDIES: I have personally reviewed the radiological images as listed and agreed with the findings in the report.  Bone survey 03/26/2015 IMPRESSION: No radiolucent bony lesions are seen. Degenerative changes are noted in the lower cervical spine and throughout the thoracic spine.   ASSESSMENT & PLAN: 70 year old African-American male  1. 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 Both his kappa and lambda light chain are elevated. His 24-hour urine was also negative for Keane Scrape protein. This is more consistent with multiclonal gammopathy. I think MGUS or multiple myeloma is very unlikely. -His repeated SPEP and light chain level again showed no detectable M protein.  -His bone survey was negative -His lab June 2016 also reviewed positive ANA and cANCa, he does not have any clinical signs of rheumatological disease, I encourage him to discuss with his primary care physician Dr. Yong Channel, and to see if he needs a referral to see a rheumatologist -I reviewed the repeated lab results today, his CBC is normal, his CMP revealed high total protein and elevated Cr.  -I do not think he needs further workup, I'll only see him as needed in  the future.  2. Mild thrombocytopenia -He has had mild thrombocytopenia with platelet in 120-150 range in 2016  -Giving his positive ANA and cANCA, his thrombocytopenia could be immune related. -His platelet count has been normal lately   3. HTN and CKD stage III  -I discussed his lab findings, he has mild elevated creatinine, possibly related to his hypertension. -His blood pressure is well controlled -He will continue follow-up with Dr. Yong Channel   Plan -I'll discharge him from my clinic and see him on as needed base in the future -He will follow-up with Dr. Yong Channel  All questions were answered. The patient knows to call the clinic with any problems, questions or concerns. I spent 15 minutes counseling the patient face to face. The total time spent in the appointment was 20 minutes and more than 50% was on counseling.     Truitt Merle, MD 03/19/2016 1:00 PM

## 2016-04-05 ENCOUNTER — Other Ambulatory Visit: Payer: Self-pay | Admitting: Family Medicine

## 2016-04-06 ENCOUNTER — Telehealth: Payer: Self-pay | Admitting: Family Medicine

## 2016-04-06 ENCOUNTER — Other Ambulatory Visit: Payer: Self-pay

## 2016-04-06 MED ORDER — OLMESARTAN MEDOXOMIL-HCTZ 40-25 MG PO TABS: ORAL_TABLET | ORAL | Status: DC

## 2016-04-06 NOTE — Telephone Encounter (Signed)
Medication refilled. Patient notified.

## 2016-04-06 NOTE — Telephone Encounter (Signed)
Pt needs refill on benicar 40-25 mg #90 w/refills [facility]. Pt has cpx in April 2017

## 2016-07-09 DIAGNOSIS — Z23 Encounter for immunization: Secondary | ICD-10-CM | POA: Diagnosis not present

## 2016-07-26 ENCOUNTER — Other Ambulatory Visit: Payer: Self-pay | Admitting: Family Medicine

## 2016-12-27 ENCOUNTER — Other Ambulatory Visit: Payer: Self-pay | Admitting: Family Medicine

## 2017-01-31 ENCOUNTER — Encounter: Payer: Medicare Other | Admitting: Family Medicine

## 2017-02-02 ENCOUNTER — Other Ambulatory Visit (INDEPENDENT_AMBULATORY_CARE_PROVIDER_SITE_OTHER): Payer: Medicare Other

## 2017-02-02 ENCOUNTER — Other Ambulatory Visit: Payer: Medicare Other

## 2017-02-02 ENCOUNTER — Ambulatory Visit (INDEPENDENT_AMBULATORY_CARE_PROVIDER_SITE_OTHER): Payer: Medicare Other

## 2017-02-02 VITALS — BP 104/70 | HR 83 | Ht 72.0 in | Wt 241.4 lb

## 2017-02-02 DIAGNOSIS — Z0000 Encounter for general adult medical examination without abnormal findings: Secondary | ICD-10-CM

## 2017-02-02 DIAGNOSIS — E785 Hyperlipidemia, unspecified: Secondary | ICD-10-CM | POA: Diagnosis not present

## 2017-02-02 DIAGNOSIS — R972 Elevated prostate specific antigen [PSA]: Secondary | ICD-10-CM

## 2017-02-02 DIAGNOSIS — Z87891 Personal history of nicotine dependence: Secondary | ICD-10-CM

## 2017-02-02 LAB — PSA: PSA: 4.97 ng/mL — AB (ref 0.10–4.00)

## 2017-02-02 LAB — LIPID PANEL
CHOL/HDL RATIO: 3
CHOLESTEROL: 149 mg/dL (ref 0–200)
HDL: 43 mg/dL (ref >39.00)
LDL CALC: 88 mg/dL (ref 0–99)
NonHDL: 106.2
TRIGLYCERIDES: 93 mg/dL (ref 0.0–149.0)
VLDL: 18.6 mg/dL (ref 0.0–40.0)

## 2017-02-02 LAB — COMPREHENSIVE METABOLIC PANEL
ALBUMIN: 3.8 g/dL (ref 3.5–5.2)
ALT: 39 U/L (ref 0–53)
AST: 40 U/L — ABNORMAL HIGH (ref 0–37)
Alkaline Phosphatase: 69 U/L (ref 39–117)
BUN: 21 mg/dL (ref 6–23)
CALCIUM: 10.2 mg/dL (ref 8.4–10.5)
CHLORIDE: 104 meq/L (ref 96–112)
CO2: 26 mEq/L (ref 19–32)
Creatinine, Ser: 1.46 mg/dL (ref 0.40–1.50)
GFR: 61.45 mL/min (ref >60.00)
Glucose, Bld: 102 mg/dL — ABNORMAL HIGH (ref 70–99)
POTASSIUM: 3.9 meq/L (ref 3.5–5.1)
SODIUM: 136 meq/L (ref 135–145)
TOTAL PROTEIN: 9.2 g/dL — AB (ref 6.0–8.3)
Total Bilirubin: 0.7 mg/dL (ref 0.2–1.2)

## 2017-02-02 LAB — CBC WITH DIFFERENTIAL/PLATELET
Basophils Absolute: 0.1 10*3/uL (ref 0.0–0.1)
Basophils Relative: 0.8 % (ref 0.0–3.0)
EOS ABS: 0.2 10*3/uL (ref 0.0–0.7)
EOS PCT: 2.8 % (ref 0.0–5.0)
HEMATOCRIT: 38.3 % — AB (ref 39.0–52.0)
HEMOGLOBIN: 12.8 g/dL — AB (ref 13.0–17.0)
LYMPHS PCT: 32.2 % (ref 12.0–46.0)
Lymphs Abs: 2.1 10*3/uL (ref 0.7–4.0)
MCHC: 33.4 g/dL (ref 30.0–36.0)
MCV: 80.3 fl (ref 78.0–100.0)
MONO ABS: 0.7 10*3/uL (ref 0.1–1.0)
Monocytes Relative: 11 % (ref 3.0–12.0)
Neutro Abs: 3.5 10*3/uL (ref 1.4–7.7)
Neutrophils Relative %: 53.2 % (ref 43.0–77.0)
Platelets: 167 10*3/uL (ref 150.0–400.0)
RBC: 4.77 Mil/uL (ref 4.22–5.81)
RDW: 16.5 % — ABNORMAL HIGH (ref 11.5–15.5)
WBC: 6.7 10*3/uL (ref 4.0–10.5)

## 2017-02-02 LAB — POC URINALSYSI DIPSTICK (AUTOMATED)
BILIRUBIN UA: NEGATIVE
Blood, UA: NEGATIVE
GLUCOSE UA: NEGATIVE
KETONES UA: NEGATIVE
Leukocytes, UA: NEGATIVE
Nitrite, UA: NEGATIVE
Protein, UA: NEGATIVE
SPEC GRAV UA: 1.02 (ref 1.010–1.025)
Urobilinogen, UA: 0.2 E.U./dL
pH, UA: 6 (ref 5.0–8.0)

## 2017-02-02 NOTE — Patient Instructions (Addendum)
Caleb Horton , Thank you for taking time to come for your Medicare Wellness Visit. I appreciate your ongoing commitment to your health goals. Please review the following plan we discussed and let me know if I can assist you in the future.   Schedule for abdominal aortic aneurysm check due to smoking history  Early afternoon on Wed  Please check with [facility] to confirm you rec'd your tetanus and let Dr. Yong Channel know on Monday when you see him   There is a new shingles vaccines out this year called the shingrix. It is 2 separate vaccines taken approx 8 weeks apart and you can get these at the pharmacy; will check with Dr. Rondel Oh check with [place] to see if he had Tetanus there  These are the goals we discussed: Goals    . patient          To fup on foot numbness; stops him from bowling; difficulty sliding        This is a list of the screening recommended for you and due dates:  Health Maintenance  Topic Date Due  . Tetanus Vaccine  10/12/2015  .  Hepatitis C: One time screening is recommended by Center for Disease Control  (CDC) for  adults born from [birth year] through 1965.   10/08/2098*  . Flu Shot  05/11/2017  . Colon Cancer Screening  06/25/2020  . Pneumonia vaccines  Completed  *Topic was postponed. The date shown is not the original due date.   '    Fall Prevention in the [place] can cause injuries. They can happen to people of all ages. There are many things you can do to make your home safe and to help prevent falls. What can I do on the outside of my home?  Regularly fix the edges of walkways and driveways and fix any cracks.  Remove anything that might make you trip as you walk through a door, such as a raised step or threshold.  Trim any bushes or trees on the path to your home.  Use bright outdoor lighting.  Clear any walking paths of anything that might make someone trip, such as rocks or tools.  Regularly check to see if handrails are loose or  broken. Make sure that both sides of any steps have handrails.  Any raised decks and porches should have guardrails on the edges.  Have any leaves, snow, or ice cleared regularly.  Use sand or salt on walking paths during winter.  Clean up any spills in your garage right away. This includes oil or grease spills. What can I do in the bathroom?  Use night lights.  Install grab bars by the toilet and in the tub and shower. Do not use towel bars as grab bars.  Use non-skid mats or decals in the tub or shower.  If you need to sit down in the shower, use a plastic, non-slip stool.  Keep the floor dry. Clean up any water that spills on the floor as soon as it happens.  Remove soap buildup in the tub or shower regularly.  Attach bath mats securely with double-sided non-slip rug tape.  Do not have throw rugs and other things on the floor that can make you trip. What can I do in the bedroom?  Use night lights.  Make sure that you have a light by your bed that is easy to reach.  Do not use any sheets or blankets that are too big for  your bed. They should not hang down onto the floor.  Have a firm chair that has side arms. You can use this for support while you get dressed.  Do not have throw rugs and other things on the floor that can make you trip. What can I do in the kitchen?  Clean up any spills right away.  Avoid walking on wet floors.  Keep items that you use a lot in easy-to-reach places.  If you need to reach something above you, use a strong step stool that has a grab bar.  Keep electrical cords out of the way.  Do not use floor polish or wax that makes floors slippery. If you must use wax, use non-skid floor wax.  Do not have throw rugs and other things on the floor that can make you trip. What can I do with my stairs?  Do not leave any items on the stairs.  Make sure that there are handrails on both sides of the stairs and use them. Fix handrails that are  broken or loose. Make sure that handrails are as long as the stairways.  Check any carpeting to make sure that it is firmly attached to the stairs. Fix any carpet that is loose or worn.  Avoid having throw rugs at the top or bottom of the stairs. If you do have throw rugs, attach them to the floor with carpet tape.  Make sure that you have a light switch at the top of the stairs and the bottom of the stairs. If you do not have them, ask someone to add them for you. What else can I do to help prevent falls?  Wear shoes that:  Do not have high heels.  Have rubber bottoms.  Are comfortable and fit you well.  Are closed at the toe. Do not wear sandals.  If you use a stepladder:  Make sure that it is fully opened. Do not climb a closed stepladder.  Make sure that both sides of the stepladder are locked into place.  Ask someone to hold it for you, if possible.  Clearly mark and make sure that you can see:  Any grab bars or handrails.  First and last steps.  Where the edge of each step is.  Use tools that help you move around (mobility aids) if they are needed. These include:  Canes.  Walkers.  Scooters.  Crutches.  Turn on the lights when you go into a dark area. Replace any light bulbs as soon as they burn out.  Set up your furniture so you have a clear path. Avoid moving your furniture around.  If any of your floors are uneven, fix them.  If there are any pets around you, be aware of where they are.  Review your medicines with your doctor. Some medicines can make you feel dizzy. This can increase your chance of falling. Ask your doctor what other things that you can do to help prevent falls. This information is not intended to replace advice given to you by your health care provider. Make sure you discuss any questions you have with your health care provider. Document Released: 07/24/2009 Document Revised: 03/04/2016 Document Reviewed: 11/01/2014 Elsevier  Interactive Patient Education  2017 [place] Maintenance, Male A healthy lifestyle and preventive care is important for your health and wellness. Ask your health care provider about what schedule of regular examinations is right for you. What should I know about weight and diet?  Eat a  Healthy Diet  Eat plenty of vegetables, fruits, whole grains, low-fat dairy products, and lean protein.  Do not eat a lot of foods high in solid fats, added sugars, or salt. Maintain a Healthy Weight  Regular exercise can help you achieve or maintain a healthy weight. You should:  Do at least 150 minutes of exercise each week. The exercise should increase your heart rate and make you sweat (moderate-intensity exercise).  Do strength-training exercises at least twice a week. Watch Your Levels of Cholesterol and Blood Lipids  Have your blood tested for lipids and cholesterol every 5 years starting at 70 years of age. If you are at high risk for heart disease, you should start having your blood tested when you are 70 years old. You may need to have your cholesterol levels checked more often if:  Your lipid or cholesterol levels are high.  You are older than 70 years of age.  You are at high risk for heart disease. What should I know about cancer screening? Many types of cancers can be detected early and may often be prevented. Lung Cancer  You should be screened every year for lung cancer if:  You are a current smoker who has smoked for at least 30 years.  You are a former smoker who has quit within the past 15 years.  Talk to your health care provider about your screening options, when you should start screening, and how often you should be screened. Colorectal Cancer  Routine colorectal cancer screening usually begins at 70 years of age and should be repeated every 5-10 years until you are 70 years old. You may need to be screened more often if early forms of precancerous polyps or  small growths are found. Your health care provider may recommend screening at an earlier age if you have risk factors for colon cancer.  Your health care provider may recommend using home test kits to check for hidden blood in the stool.  A small camera at the end of a tube can be used to examine your colon (sigmoidoscopy or colonoscopy). This checks for the earliest forms of colorectal cancer. Prostate and Testicular Cancer  Depending on your age and overall health, your health care provider may do certain tests to screen for prostate and testicular cancer.  Talk to your health care provider about any symptoms or concerns you have about testicular or prostate cancer. Skin Cancer  Check your skin from head to toe regularly.  Tell your health care provider about any new moles or changes in moles, especially if:  There is a change in a mole's size, shape, or color.  You have a mole that is larger than a pencil eraser.  Always use sunscreen. Apply sunscreen liberally and repeat throughout the day.  Protect yourself by wearing long sleeves, pants, a wide-brimmed hat, and sunglasses when outside. What should I know about heart disease, diabetes, and high blood pressure?  If you are 8-15 years of age, have your blood pressure checked every 3-5 years. If you are 35 years of age or older, have your blood pressure checked every year. You should have your blood pressure measured twice-once when you are at a hospital or clinic, and once when you are not at a hospital or clinic. Record the average of the two measurements. To check your blood pressure when you are not at a hospital or clinic, you can use:  An automated blood pressure machine at a pharmacy.  A home blood pressure monitor.  Talk to your health care provider about your target blood pressure.  If you are between 7-35 years old, ask your health care provider if you should take aspirin to prevent heart disease.  Have regular  diabetes screenings by checking your fasting blood sugar level.  If you are at a normal weight and have a low risk for diabetes, have this test once every three years after the age of 47.  If you are overweight and have a high risk for diabetes, consider being tested at a younger age or more often.  A one-time screening for abdominal aortic aneurysm (AAA) by ultrasound is recommended for men aged 19-75 years who are current or former smokers. What should I know about preventing infection? Hepatitis B  If you have a higher risk for hepatitis B, you should be screened for this virus. Talk with your health care provider to find out if you are at risk for hepatitis B infection. Hepatitis C  Blood testing is recommended for:  Everyone born from [birth year] through 1964.  Anyone with known risk factors for hepatitis C. Sexually Transmitted Diseases (STDs)  You should be screened each year for STDs including gonorrhea and chlamydia if:  You are sexually active and are younger than 70 years of age.  You are older than 70 years of age and your health care provider tells you that you are at risk for this type of infection.  Your sexual activity has changed since you were last screened and you are at an increased risk for chlamydia or gonorrhea. Ask your health care provider if you are at risk.  Talk with your health care provider about whether you are at high risk of being infected with HIV. Your health care provider may recommend a prescription medicine to help prevent HIV infection. What else can I do?  Schedule regular health, dental, and eye exams.  Stay current with your vaccines (immunizations).  Do not use any tobacco products, such as cigarettes, chewing tobacco, and e-cigarettes. If you need help quitting, ask your health care provider.  Limit alcohol intake to no more than 2 drinks per day. One drink equals 12 ounces of beer, 5 ounces of wine, or 1 ounces of hard liquor.  Do not use  street drugs.  Do not share needles.  Ask your health care provider for help if you need support or information about quitting drugs.  Tell your health care provider if you often feel depressed.  Tell your health care provider if you have ever been abused or do not feel safe at home. This information is not intended to replace advice given to you by your health care provider. Make sure you discuss any questions you have with your health care provider. Document Released: 03/25/2008 Document Revised: 05/26/2016 Document Reviewed: 07/01/2015 Elsevier Interactive Patient Education  2017 [employer].
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
## 2017-02-02 NOTE — Progress Notes (Signed)
I have reviewed and agree with note, evaluation, plan. Can discuss numbness if he would like a tmondays visit. Thankful for AAA testing order  Garret Reddish, MD

## 2017-02-07 ENCOUNTER — Ambulatory Visit (INDEPENDENT_AMBULATORY_CARE_PROVIDER_SITE_OTHER): Payer: Medicare Other | Admitting: Family Medicine

## 2017-02-07 ENCOUNTER — Encounter: Payer: Self-pay | Admitting: Family Medicine

## 2017-02-07 VITALS — BP 112/68 | HR 94 | Temp 98.6°F | Ht 72.5 in | Wt 241.2 lb

## 2017-02-07 DIAGNOSIS — R739 Hyperglycemia, unspecified: Secondary | ICD-10-CM | POA: Diagnosis not present

## 2017-02-07 DIAGNOSIS — R200 Anesthesia of skin: Secondary | ICD-10-CM

## 2017-02-07 DIAGNOSIS — G609 Hereditary and idiopathic neuropathy, unspecified: Secondary | ICD-10-CM | POA: Insufficient documentation

## 2017-02-07 DIAGNOSIS — N183 Chronic kidney disease, stage 3 unspecified: Secondary | ICD-10-CM

## 2017-02-07 DIAGNOSIS — Z0000 Encounter for general adult medical examination without abnormal findings: Secondary | ICD-10-CM

## 2017-02-07 DIAGNOSIS — R972 Elevated prostate specific antigen [PSA]: Secondary | ICD-10-CM

## 2017-02-07 MED ORDER — ZOSTER VAC RECOMB ADJUVANTED 50 MCG/0.5ML IM SUSR: 0.5000 mL | Freq: Once | INTRAMUSCULAR | 1 refills | Status: AC

## 2017-02-07 NOTE — Assessment & Plan Note (Signed)
Hyperglycemia- fasting sugar 102. Has lost weight- congratulated.  Lab Results  Component Value Date   HGBA1C 5.7 05/17/2014

## 2017-02-07 NOTE — Patient Instructions (Signed)
We will call you within a week or two about your referral to urology due to elevated PSA (very important to follow through with this with risk of prostate cancer). If you do not hear within 3 weeks, give [place] a call.   We discussed the numbness issue and could refer to neurology. Another test I think could be valuable is a b12 and we could consider that with future labs.

## 2017-02-07 NOTE — Assessment & Plan Note (Signed)
CKD III- knows to avoid nsaids. We discussed if not tolerating PO due to illness- advised to hold his olmesartan. GFR actually just above 60 this time- best creatinine in 2 years

## 2017-02-07 NOTE — Progress Notes (Signed)
Phone: [phone]  Subjective:  Patient presents today for their annual physical. Chief complaint-noted.   See problem oriented charting- ROS- full  review of systems was completed and negative except for: numbness in bottom of feet  The following were reviewed and entered/updated in epic: Past Medical History:  Diagnosis Date  . Anemia   . Hypertension   . INGROWN NAIL 11/10/2010   Annotation: bilateral  Qualifier: Diagnosis of  By: Arnoldo Morale MD, Balinda Quails    Patient Active Problem List   Diagnosis Date Noted  . Hyperlipidemia 09/04/2014    Priority: Medium  . CKD (chronic kidney disease), stage III 09/04/2014    Priority: Medium  . Essential hypertension 11/27/2007    Priority: Medium  . History of adenomatous polyp of colon 08/05/2015    Priority: Low  . Hyperglycemia 11/27/2007    Priority: Low  . Sickle-cell trait ([place]) 11/27/2007    Priority: Low  . BPH (benign prostatic hyperplasia) 11/27/2007    Priority: Low  . Numbness in feet 02/07/2017  . Thrombocytopenia ([place]) 09/19/2015  . Gammopathy 03/20/2015  . Elevated blood protein 02/04/2015   Past Surgical History:  Procedure Laterality Date  . CIRCUMCISION     age 36    Family History  Problem Relation Age of Onset  . Hypertension Father   . Colon cancer Neg Hx     Medications- reviewed and updated Current Outpatient Prescriptions  Medication Sig Dispense Refill  . atorvastatin (LIPITOR) 10 MG tablet TAKE 1 TABLET BY MOUTH  DAILY 90 tablet 1  . olmesartan-hydrochlorothiazide (BENICAR HCT) 40-25 MG tablet TAKE 1 TABLET EVERY DAY 90 tablet 3  . Zoster Vac Recomb Adjuvanted [facility]) injection Inject 0.5 mLs into the muscle once. Repeat injection in 2-6 months. Please inform [place] when completed. 0.5 mL 1   No current facility-administered medications for this visit.     Allergies-reviewed and updated No Known Allergies  Social History   Social History  . Marital status: Married    Spouse name: N/A   . Number of children: N/A  . Years of education: N/A   Social History Main Topics  . Smoking status: Former Smoker    Packs/day: 0.50    Years: 3.00    Quit date: 10/11/1968  . Smokeless tobacco: Never Used  . Alcohol use 3.0 oz/week    1 Shots of liquor, 4 Standard drinks or equivalent per week     Comment: once a week   . Drug use: No  . Sexual activity: Not Asked   Other Topics Concern  . None   Social History Narrative   Married (3rd marriage). 5 children (3 from 1st, 2 from second), 7 grandkids.       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 Oropharynx normal Neck: no thyromegaly CV: RRR no murmurs rubs or gallops Lungs: CTAB no crackles, wheeze, rhonchi Abdomen: soft/nontender/nondistended/normal bowel sounds. No rebound or guarding.  Ext: no edema Skin: warm, dry Neuro: grossly normal, moves all extremities, PERRLA, feet intact to gross touch  Declines rectal exam  Assessment/Plan:  71 y.o.  male presenting for annual physical.  Health Maintenance counseling: 1. Anticipatory guidance: Patient counseled regarding regular dental exams yearly, eye exams yearly, wearing seatbelts.  2. Risk factor reduction:  Advised patient of need for regular exercise and diet rich and fruits and vegetables to reduce risk of heart attack and stroke. Exercise- continuing his regular exercise with [employer]. Diet-eating more consistently.  Wt Readings from Last 3 Encounters:  02/07/17 241 lb 3.2 oz (109.4 kg)  02/02/17 241 lb 6 oz (109.5 kg)  03/19/16 254 lb 6.4 oz (115.4 kg)  3. Immunizations/screenings/ancillary studies- offered shingrix and  printed Immunization History  Administered Date(s) Administered  . Influenza Split 08/15/2012  . Influenza, High Dose Seasonal PF 07/14/2013, 07/05/2014  . Influenza-Unspecified 07/20/2015  . Pneumococcal Conjugate-13 07/05/2014  . Pneumococcal Polysaccharide-23 02/12/2013  . Td 10/11/2005  . Zoster 02/04/2015  4. Prostate cancer screening- will refer to urology given psa trend. He declines rectal today . BPH ? In past but flomax not helpful Lab Results  Component Value Date   PSA 4.97 (H) 02/02/2017   PSA 3.88 01/29/2016   PSA 3.04 01/28/2015   5. Colon cancer screening - 06/2015 with 5 year repeat due to adenoma history  Status of chronic or acute concerns  Hyperlipidemia- controlled on atorvastatin 10mg   HTN- well controlled on olmesartan-hctz 40-25mg .   Numbness in feet Numbness in bottom of both feet. Some decreased sensation in his toes- ended up getting an appointment with a physician after a dinner he had responded to a mailer for. No back pain.   Gammopathy- followed with hematology. Released- unless there is worsening- labs appear largely stable. Some anemia but platelets now normal. Discussed ANA and cANCA? - no symptoms of rheumatological issues. We discussed vasculitis with this elevation and neuropathy possible but he states numbness has been for 2-3 years and not worsening with no other symptoms so I thought less likely the cause.   We discussed neurology referral and he declines for now- states may be interested by next years physical. Suspected neuropathy - but uknown etiology  CKD (chronic kidney disease), stage III CKD III- knows to avoid nsaids. We discussed if not tolerating PO due to illness- advised to hold his olmesartan. GFR actually just above 60 this time- best creatinine in 2 years  Hyperglycemia Hyperglycemia- fasting sugar 102. Has lost weight- congratulated.  Lab Results  Component Value Date   HGBA1C 5.7 05/17/2014     Return in about 1 year  (around 02/07/2018) for physical. Offered 6 month follow up but he declines Asked him to let us know when he gets 2 shingrix doses  Orders Placed This Encounter  Procedures  . Ambulatory referral to Urology    Referral Priority:   Routine    Referral Type:   Consultation    Referral Reason:   Specialty Services Required    Requested Specialty:   Urology    Number of Visits Requested:   1    Meds ordered this encounter  Medications  . Zoster Vac Recomb Adjuvanted [facility]) injection    Sig: Inject 0.5 mLs into the muscle once. Repeat injection in 2-6 months. Please inform [place] when completed.    Dispense:  0.5 mL    Refill:  1    Return precautions advised.   Garret Reddish, MD

## 2017-02-07 NOTE — Assessment & Plan Note (Addendum)
Numbness in bottom of both feet. Some decreased sensation in his toes- ended up getting an appointment with a physician after a dinner he had responded to a mailer for. No back pain.   Gammopathy- followed with hematology. Released- unless there is worsening- labs appear largely stable. Some anemia but platelets now normal. Discussed ANA and cANCA? - no symptoms of rheumatological issues. We discussed vasculitis with this elevation and neuropathy possible but he states numbness has been for 2-3 years and not worsening with no other symptoms so I thought less likely the cause.   We discussed neurology referral and he declines for now- states may be interested by next years physical. Suspected neuropathy - but uknown etiology

## 2017-02-07 NOTE — Progress Notes (Signed)
Pre visit review using our clinic review tool, if applicable. No additional management support is needed unless otherwise documented below in the visit note. 

## 2017-03-16 ENCOUNTER — Ambulatory Visit (HOSPITAL_COMMUNITY)
Admission: RE | Admit: 2017-03-16 | Discharge: 2017-03-16 | Disposition: A | Discharge status: Home / Self Care | Payer: Medicare Other | Source: Ambulatory Visit | Attending: Cardiology | Admitting: Cardiology

## 2017-03-16 DIAGNOSIS — I708 Atherosclerosis of other arteries: Secondary | ICD-10-CM | POA: Insufficient documentation

## 2017-03-16 DIAGNOSIS — Z87891 Personal history of nicotine dependence: Secondary | ICD-10-CM | POA: Insufficient documentation

## 2017-03-16 DIAGNOSIS — Z1389 Encounter for screening for other disorder: Secondary | ICD-10-CM | POA: Insufficient documentation

## 2017-03-16 DIAGNOSIS — I723 Aneurysm of iliac artery: Secondary | ICD-10-CM | POA: Diagnosis not present

## 2017-03-16 DIAGNOSIS — I714 Abdominal aortic aneurysm, without rupture: Secondary | ICD-10-CM | POA: Diagnosis not present

## 2017-03-16 DIAGNOSIS — I10 Essential (primary) hypertension: Secondary | ICD-10-CM | POA: Insufficient documentation

## 2017-03-16 DIAGNOSIS — E785 Hyperlipidemia, unspecified: Secondary | ICD-10-CM | POA: Insufficient documentation

## 2017-03-16 DIAGNOSIS — I700 Atherosclerosis of aorta: Secondary | ICD-10-CM | POA: Diagnosis not present

## 2017-03-17 ENCOUNTER — Encounter: Payer: Self-pay | Admitting: Family Medicine

## 2017-03-17 DIAGNOSIS — I714 Abdominal aortic aneurysm, without rupture, unspecified: Secondary | ICD-10-CM | POA: Insufficient documentation

## 2017-03-29 ENCOUNTER — Other Ambulatory Visit: Payer: Self-pay | Admitting: Family Medicine

## 2017-04-05 DIAGNOSIS — R972 Elevated prostate specific antigen [PSA]: Secondary | ICD-10-CM | POA: Diagnosis not present

## 2017-04-05 DIAGNOSIS — N400 Enlarged prostate without lower urinary tract symptoms: Secondary | ICD-10-CM | POA: Diagnosis not present

## 2017-05-11 DIAGNOSIS — R972 Elevated prostate specific antigen [PSA]: Secondary | ICD-10-CM | POA: Diagnosis not present

## 2017-05-11 DIAGNOSIS — C61 Malignant neoplasm of prostate: Secondary | ICD-10-CM | POA: Diagnosis not present

## 2017-05-20 DIAGNOSIS — C61 Malignant neoplasm of prostate: Secondary | ICD-10-CM | POA: Diagnosis not present

## 2017-07-05 ENCOUNTER — Other Ambulatory Visit: Payer: Self-pay | Admitting: Family Medicine

## 2017-08-17 DIAGNOSIS — C61 Malignant neoplasm of prostate: Secondary | ICD-10-CM | POA: Diagnosis not present

## 2017-08-17 DIAGNOSIS — D075 Carcinoma in situ of prostate: Secondary | ICD-10-CM | POA: Diagnosis not present

## 2017-08-29 DIAGNOSIS — C61 Malignant neoplasm of prostate: Secondary | ICD-10-CM | POA: Diagnosis not present

## 2017-08-31 ENCOUNTER — Encounter: Payer: Self-pay | Admitting: Family Medicine

## 2017-08-31 DIAGNOSIS — C61 Malignant neoplasm of prostate: Secondary | ICD-10-CM | POA: Insufficient documentation

## 2018-02-16 ENCOUNTER — Encounter: Payer: Self-pay | Admitting: Family Medicine

## 2018-02-16 ENCOUNTER — Ambulatory Visit (INDEPENDENT_AMBULATORY_CARE_PROVIDER_SITE_OTHER): Payer: Medicare Other | Admitting: Family Medicine

## 2018-02-16 VITALS — BP 138/82 | HR 95 | Temp 98.7°F | Ht 72.5 in | Wt 258.0 lb

## 2018-02-16 DIAGNOSIS — R739 Hyperglycemia, unspecified: Secondary | ICD-10-CM | POA: Diagnosis not present

## 2018-02-16 DIAGNOSIS — E669 Obesity, unspecified: Secondary | ICD-10-CM

## 2018-02-16 DIAGNOSIS — C61 Malignant neoplasm of prostate: Secondary | ICD-10-CM | POA: Diagnosis not present

## 2018-02-16 DIAGNOSIS — I714 Abdominal aortic aneurysm, without rupture, unspecified: Secondary | ICD-10-CM

## 2018-02-16 DIAGNOSIS — D573 Sickle-cell trait: Secondary | ICD-10-CM | POA: Diagnosis not present

## 2018-02-16 DIAGNOSIS — R200 Anesthesia of skin: Secondary | ICD-10-CM

## 2018-02-16 DIAGNOSIS — I10 Essential (primary) hypertension: Secondary | ICD-10-CM | POA: Diagnosis not present

## 2018-02-16 DIAGNOSIS — N183 Chronic kidney disease, stage 3 unspecified: Secondary | ICD-10-CM

## 2018-02-16 DIAGNOSIS — Z0000 Encounter for general adult medical examination without abnormal findings: Secondary | ICD-10-CM

## 2018-02-16 DIAGNOSIS — E785 Hyperlipidemia, unspecified: Secondary | ICD-10-CM | POA: Diagnosis not present

## 2018-02-16 NOTE — Progress Notes (Signed)
Phone: [phone]  Subjective:  Patient presents today for their annual physical. Chief complaint-noted.   See problem oriented charting- ROS- full  review of systems was completed and negative except for: numbness in bottom of both feet  The following were reviewed and entered/updated in epic: Past Medical History:  Diagnosis Date  . Anemia   . Hypertension   . INGROWN NAIL 11/10/2010   Annotation: bilateral  Qualifier: Diagnosis of  By: Arnoldo Morale MD, Balinda Quails    Patient Active Problem List   Diagnosis Date Noted  . AAA (abdominal aortic aneurysm) ([place]) 03/17/2017    Priority: Medium  . Numbness in feet 02/07/2017    Priority: Medium  . Hyperlipidemia 09/04/2014    Priority: Medium  . CKD (chronic kidney disease), stage III ([place]) 09/04/2014    Priority: Medium  . Essential hypertension 11/27/2007    Priority: Medium  . History of adenomatous polyp of colon 08/05/2015    Priority: Low  . Hyperglycemia 11/27/2007    Priority: Low  . Sickle-cell trait ([place]) 11/27/2007    Priority: Low  . BPH (benign prostatic hyperplasia) 11/27/2007    Priority: Low  . Prostate cancer ([place]) 08/31/2017  . Thrombocytopenia ([place]) 09/19/2015  . Gammopathy 03/20/2015  . Elevated blood protein 02/04/2015   Past Surgical History:  Procedure Laterality Date  . CIRCUMCISION     age 85    Family History  Problem Relation Age of Onset  . Hypertension Father   . Colon cancer Neg Hx     Medications- reviewed and updated Current Outpatient Medications  Medication Sig Dispense Refill  . atorvastatin (LIPITOR) 10 MG tablet TAKE 1 TABLET BY MOUTH  DAILY 90 tablet 3  . olmesartan-hydrochlorothiazide (BENICAR HCT) 40-25 MG tablet TAKE 1 TABLET BY MOUTH  EVERY DAY 90 tablet 3   No current facility-administered medications for this visit.     Allergies-reviewed and updated No Known Allergies  Social History   Social History Narrative   Married (3rd marriage). 5 children (3 from 1st, 2 from  second), 7 grandkids.       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 Oropharynx normal Neck: no thyromegaly CV: RRR no murmurs rubs or gallops Lungs: CTAB no crackles, wheeze, rhonchi Abdomen: soft/nontender/nondistended/normal bowel sounds. No rebound or guarding. obese Ext: no edema Skin: warm, dry Neuro: grossly normal, moves all extremities, PERRLA Rectal exam deferred to urology  Assessment/Plan:  72 y.o. male presenting for annual physical.  Health Maintenance counseling: 1. Anticipatory guidance: Patient counseled regarding regular dental exams -q6 months, eye exams -yearly, wearing seatbelts.  2. Risk factor reduction:  Advised patient of need for regular exercise and diet rich and fruits and vegetables to reduce risk of heart attack and stroke. Exercise- doing [employer] classes 6 days a week. Diet-has really loosened up diet after prior losing weight- encouraged reversal.  Wt Readings from Last 3 Encounters:  02/16/18 258 lb (117 kg)  02/07/17 241 lb 3.2 oz (109.4 kg)  02/02/17 241 lb 6 oz (109.5 kg)  3. Immunizations/screenings/ancillary studies-offered Shingrix previously he did not get in the last  year.  Discussed he is also due for tetanus shot-could get at pharmacy.  Immunization History  Administered Date(s) Administered  . Influenza Split 08/15/2012  . Influenza, High Dose Seasonal PF 07/14/2013, 07/05/2014  . Influenza-Unspecified 07/20/2015  . Pneumococcal Conjugate-13 07/05/2014  . Pneumococcal Polysaccharide-23 02/12/2013  . Td 10/11/2005  . Zoster 02/04/2015  4. Prostate cancer screening-referred to urology last year.  He is now under  active surveillance for prostate cancer.  Gleason 3+3 equal 6 in 1 of 12 cores. Has a visit scheduled later this month on 15th.  Lab Results  Component Value Date   PSA 4.97 (H) 02/02/2017   PSA 3.88 01/29/2016   PSA 3.04 01/28/2015   5. 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  1 year repeat advised from last year.  We will order at this time.  Hyperlipidemia Hyperlipidemia-remains on atorvastatin 10 mg we will update lipids  Essential hypertension Hypertension-remains controlled on olmesartan-hydrochlorthiazide 40-25 mg though high normal.   Numbness in feet Numbness in feet- for last 3 to 4 years.  He declined neurology referral last year- he opts in this year.  We suspect neuropathy related.  We will check a B12 and TSH. He has been to 6 seminars on stem cells and wants to consider this but apparently $6000- I strongly asked him to see neurology first as I told him I was concerned this would help at all for neuropathy (I have not yet heard of it being used for this indication). Averages less than a shot a day alcohol- doubt alcohol related. Does have history of gammopathy- not sure if these are related. He does think started around time statin started . Will check a1c and tsh before referring  CKD (chronic kidney disease), stage III CKD 3- we have discussed holding olmesartan if he becomes ill and cannot tolerate fluids.  Will monitor with bmet today. Does not take nsaids- knows to avoids.   Hyperglycemia Hyperglycemia- last year had lost weight has regained.  A1c elevated 2015.  We will update this as well as fasting CBG. Warned him specifically with weight gain  We set a goal of at least 10 lbs off in 6 months for his obesity  Mild anemia- update  cbc  Return in about 1 year (around 02/17/2019) for physical.  Lab/Order associations: Preventative health care - Plan: VAS [place] AAA DUPLEX, Hemoglobin A1c, CBC, Comprehensive metabolic panel, Lipid panel, TSH, Vitamin B12, Ambulatory referral to Neurology  Abdominal aortic aneurysm (AAA) without rupture ([place]) - Plan: VAS [place] AAA DUPLEX  Hyperlipidemia, unspecified hyperlipidemia type - Plan: CBC, Comprehensive metabolic panel, Lipid panel, TSH  Hyperglycemia - Plan: Hemoglobin A1c  Numbness in feet - Plan: TSH, Vitamin B12, Ambulatory referral to Neurology  Return precautions advised.  Garret Reddish, MD

## 2018-02-16 NOTE — Assessment & Plan Note (Signed)
CKD 3- we have discussed holding olmesartan if he becomes ill and cannot tolerate fluids.  Will monitor with bmet today. Does not take nsaids- knows to avoids.

## 2018-02-16 NOTE — Assessment & Plan Note (Signed)
Numbness in feet- for last 3 to 4 years.  He declined neurology referral last year- he opts in this year.  We suspect neuropathy related.  We will check a B12 and TSH. He has been to 6 seminars on stem cells and wants to consider this but apparently $6000- I strongly asked him to see neurology first as I told him I was concerned this would help at all for neuropathy (I have not yet heard of it being used for this indication). Averages less than a shot a day alcohol- doubt alcohol related. Does have history of gammopathy- not sure if these are related. He does think started around time statin started . Will check a1c and tsh before referring

## 2018-02-16 NOTE — Assessment & Plan Note (Signed)
Hyperglycemia- last year had lost weight has regained.  A1c elevated 2015.  We will update this as well as fasting CBG. Warned him specifically with weight gain

## 2018-02-16 NOTE — Assessment & Plan Note (Signed)
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  1 year repeat advised from last year.  We will order at this time.

## 2018-02-16 NOTE — Patient Instructions (Addendum)
Health Maintenance Due  Topic Date Due  . TETANUS/TDAP - Informed patient to have it done at pharmacy. Same thing for shingrix (new shingles shot which takes 2 shots) . Let us know when you get these shots.  10/12/2015   I would also like for you to sign up for an annual wellness visit with one of our nurses, Cassie or Manuela Schwartz, who both specialize in the annual wellness visit. This is a free benefit under medicare that may help [place] find additional ways to help you. Some highlights are reviewing medications, lifestyle, and doing a dementia screen.  You need to hop back on the healthy eating train that you were on last year when you got all the way to 241 lbs. Has prung back up to 258 today. We set a goal of at least 10 lbs off in 6 months  Schedule a lab visit at the check out desk within 2 weeks. Return for future fasting labs meaning nothing but water after midnight please. Ok to take your medications with water.

## 2018-02-16 NOTE — Assessment & Plan Note (Signed)
Hyperlipidemia-remains on atorvastatin 10 mg we will update lipids

## 2018-02-16 NOTE — Assessment & Plan Note (Signed)
Hypertension-remains controlled on olmesartan-hydrochlorthiazide 40-25 mg though high normal.

## 2018-02-17 ENCOUNTER — Encounter: Payer: Self-pay | Admitting: Neurology

## 2018-02-22 ENCOUNTER — Other Ambulatory Visit (INDEPENDENT_AMBULATORY_CARE_PROVIDER_SITE_OTHER): Payer: Medicare Other

## 2018-02-22 DIAGNOSIS — Z0000 Encounter for general adult medical examination without abnormal findings: Secondary | ICD-10-CM

## 2018-02-22 DIAGNOSIS — R200 Anesthesia of skin: Secondary | ICD-10-CM

## 2018-02-22 DIAGNOSIS — E785 Hyperlipidemia, unspecified: Secondary | ICD-10-CM

## 2018-02-22 DIAGNOSIS — R739 Hyperglycemia, unspecified: Secondary | ICD-10-CM | POA: Diagnosis not present

## 2018-02-22 DIAGNOSIS — C61 Malignant neoplasm of prostate: Secondary | ICD-10-CM | POA: Diagnosis not present

## 2018-02-22 LAB — CBC
HEMATOCRIT: 40.8 % (ref 39.0–52.0)
Hemoglobin: 13.9 g/dL (ref 13.0–17.0)
MCHC: 34.1 g/dL (ref 30.0–36.0)
MCV: 86.9 fl (ref 78.0–100.0)
PLATELETS: 145 10*3/uL — AB (ref 150.0–400.0)
RBC: 4.7 Mil/uL (ref 4.22–5.81)
RDW: 14.6 % (ref 11.5–15.5)
WBC: 6.6 10*3/uL (ref 4.0–10.5)

## 2018-02-22 LAB — COMPREHENSIVE METABOLIC PANEL
ALT: 27 U/L (ref 0–53)
AST: 23 U/L (ref 0–37)
Albumin: 3.9 g/dL (ref 3.5–5.2)
Alkaline Phosphatase: 67 U/L (ref 39–117)
BUN: 22 mg/dL (ref 6–23)
CALCIUM: 10.1 mg/dL (ref 8.4–10.5)
CO2: 27 meq/L (ref 19–32)
CREATININE: 1.49 mg/dL (ref 0.40–1.50)
Chloride: 105 mEq/L (ref 96–112)
GFR: 59.85 mL/min — ABNORMAL LOW (ref >60.00)
GLUCOSE: 108 mg/dL — AB (ref 70–99)
Potassium: 4 mEq/L (ref 3.5–5.1)
Sodium: 138 mEq/L (ref 135–145)
Total Bilirubin: 0.4 mg/dL (ref 0.2–1.2)
Total Protein: 9.6 g/dL — ABNORMAL HIGH (ref 6.0–8.3)

## 2018-02-22 LAB — LIPID PANEL
CHOLESTEROL: 205 mg/dL — AB (ref 0–200)
HDL: 36.6 mg/dL — ABNORMAL LOW (ref >39.00)
LDL Cholesterol: 140 mg/dL — ABNORMAL HIGH (ref 0–99)
NONHDL: 168.57
Total CHOL/HDL Ratio: 6
Triglycerides: 141 mg/dL (ref 0.0–149.0)
VLDL: 28.2 mg/dL (ref 0.0–40.0)

## 2018-02-22 LAB — VITAMIN B12: VITAMIN B 12: 768 pg/mL (ref 211–911)

## 2018-02-22 LAB — HEMOGLOBIN A1C: Hgb A1c MFr Bld: 5.5 % (ref 4.6–6.5)

## 2018-02-22 LAB — TSH: TSH: 3.1 u[IU]/mL (ref 0.35–4.50)

## 2018-02-22 NOTE — Progress Notes (Signed)
Your CBC was normal (blood counts, infection fighting cells, platelets) except for slightly low platelets Your CMET was stable(kidney, liver, and electrolytes, blood sugar). Even though your blood sugar was slightly high- your 3 month test to look at blood sugar (a1c) was actually normal). Your protein remains slightly high but not worsening- you have seen hematology for this in the past Your cholesterol has jumped up significantly- did you stop your medication for cholesterol atorvastatin? I need an update on this whether he stopped and what dose he is on as I need to adjust his medication.  Your thyroid was normal. Your b12 was normal

## 2018-02-23 ENCOUNTER — Telehealth: Payer: Self-pay

## 2018-02-23 NOTE — Telephone Encounter (Signed)
Copied from [place] [id number]. Topic: General - Other >> Feb 23, 2018 10:43 AM Yvette Rack wrote: Reason for CRM: patient states that he need 2 test added on for his Oncologist Dr Genella Mech please ad PSA and Protein onto the rest of the lab  >> Feb 23, 2018 11:09 AM Morph[place], Isidoro Donning wrote: Routing to correct office.

## 2018-02-23 NOTE — Telephone Encounter (Signed)
Hist total protein was 9.6. As far as PSA- you can see if lab can add that on under prostate cancer but im not sure if they can this late

## 2018-02-23 NOTE — Telephone Encounter (Signed)
Can I add these two test for him?

## 2018-02-24 ENCOUNTER — Other Ambulatory Visit (INDEPENDENT_AMBULATORY_CARE_PROVIDER_SITE_OTHER): Payer: Medicare Other

## 2018-02-24 ENCOUNTER — Other Ambulatory Visit: Payer: Self-pay

## 2018-02-24 DIAGNOSIS — C61 Malignant neoplasm of prostate: Secondary | ICD-10-CM

## 2018-02-24 LAB — PSA: PSA: 5.24 ng/mL — AB (ref 0.10–4.00)

## 2018-02-24 NOTE — Telephone Encounter (Signed)
Called patient and gave him his protein lab result. I also scheduled him for a lab appointment to do the PSA today.

## 2018-02-25 NOTE — Progress Notes (Signed)
PSA remains high at 5.24 but pretty similar to a year ago- Team please forward results to [facility] as they are monitoring him

## 2018-03-07 ENCOUNTER — Ambulatory Visit (HOSPITAL_COMMUNITY)
Admission: RE | Admit: 2018-03-07 | Discharge: 2018-03-07 | Disposition: A | Discharge status: Home / Self Care | Payer: Medicare Other | Source: Ambulatory Visit | Attending: Cardiovascular Disease | Admitting: Cardiovascular Disease

## 2018-03-07 DIAGNOSIS — I714 Abdominal aortic aneurysm, without rupture, unspecified: Secondary | ICD-10-CM

## 2018-03-07 DIAGNOSIS — Z0000 Encounter for general adult medical examination without abnormal findings: Secondary | ICD-10-CM | POA: Diagnosis not present

## 2018-04-04 ENCOUNTER — Other Ambulatory Visit: Payer: Self-pay | Admitting: Family Medicine

## 2018-04-06 ENCOUNTER — Telehealth: Payer: Self-pay | Admitting: Family Medicine

## 2018-04-06 NOTE — Telephone Encounter (Signed)
What ARB's does pharmacy have not on back order or recall list?

## 2018-04-06 NOTE — Telephone Encounter (Signed)
Pt stated Benicar is on back order. Pt wanting to know if a substitution can be made and called to his pharmacy.

## 2018-04-06 NOTE — Telephone Encounter (Signed)
See note

## 2018-04-07 ENCOUNTER — Telehealth: Payer: Self-pay | Admitting: Family Medicine

## 2018-04-07 ENCOUNTER — Other Ambulatory Visit: Payer: Self-pay

## 2018-04-07 MED ORDER — LOSARTAN POTASSIUM-HCTZ 100-25 MG PO TABS: 1.0000 | ORAL_TABLET | Freq: Every day | ORAL | 1 refills | Status: DC

## 2018-04-07 MED ORDER — LOSARTAN POTASSIUM-HCTZ 100-25 MG PO TABS: 1.0000 | ORAL_TABLET | Freq: Every day | ORAL | 0 refills | Status: DC

## 2018-04-07 NOTE — Telephone Encounter (Signed)
Long term prescription sent to [facility] for Losartan-hydrochlorathiazide. 14 day short term sent to [facility]

## 2018-04-07 NOTE — Telephone Encounter (Signed)
Called and notified patient of medication change and that long term and short term prescriptions had been sent to pharmacy

## 2018-04-07 NOTE — Telephone Encounter (Signed)
See note.   Copied from [place] [phone]. Topic: Quick Communication - See Telephone Encounter >> Apr 07, 2018 11:21 AM Antonieta Iba C wrote: CRM for notification. See Telephone encounter for: 04/07/18.  Pt called in to ask if provider has samples for olmesartan-hydrochlorothiazide (BENICAR HCT) 40-25 MG tablet, pt says that he still has not received order from mail order, and pt has 1 pill left.   Please call back   CB: [phone]

## 2018-04-18 ENCOUNTER — Ambulatory Visit: Payer: Medicare Other | Admitting: Neurology

## 2018-04-18 ENCOUNTER — Other Ambulatory Visit (INDEPENDENT_AMBULATORY_CARE_PROVIDER_SITE_OTHER): Payer: Medicare Other

## 2018-04-18 ENCOUNTER — Encounter: Payer: Self-pay | Admitting: Neurology

## 2018-04-18 VITALS — BP 96/64 | HR 111 | Ht 72.0 in | Wt 243.0 lb

## 2018-04-18 DIAGNOSIS — G609 Hereditary and idiopathic neuropathy, unspecified: Secondary | ICD-10-CM

## 2018-04-18 LAB — FOLATE: Folate: 24 ng/mL (ref >5.9)

## 2018-04-18 NOTE — Patient Instructions (Addendum)
1.  We will screen for other common causes of polyneuropathy, such as ANA, cANCA/pANCA, B6, folate, ACE. 2.  We will check nerve study of the lower extremities 3.  Further recommendations pending results.   Your provider has requested that you have labwork completed today. Please go to [facility] ([id number]) on the second floor of this building before leaving the office today. You do not need to check in. If you are not called within 15 minutes please check with the front desk.

## 2018-04-18 NOTE — Progress Notes (Signed)
Cardiology Office Note:    Date:  04/18/2018   ID:  Caleb Horton, DOB October 25, 1945, MRN [id number]  PCP:  Marin Olp, MD  Cardiologist:  Buford Dresser, MD PhD  Referring MD: Marin Olp, MD   Chief Complaint  Patient presents with  . New Patient (Initial Visit)    pt c/o weakness, SOB    History of Present Illness:    Caleb Horton is a 72 y.o. male with a hx of hypertension, concern for AAA who is seen at the request of Dr. Yong Channel for recommendations given AAA screening.  Patient concerns today are acute: Feels very fatigued, started 1-2 weeks ago. Got a fever 104-105 on 7/6-7/7, no cough or URI symptoms, no GI symptoms. No sick contacts, no recent travel. No nausea, night sweats. Drinking fair but not eating well, no appetite. Feels like how he did several years ago when he was losing blood. Denies any melena, hematochezia, hematuria or gross blood loss.   AAA: Report 03/08/18 noted no visualization of an abdominal aortic aneurysm. Largest measurement was 2.4 cm at the proximal segment. Could not duplicate study from 10/6999 that showed 3.0 cm x 2.8 cm suprarenal fusiform AAA. Recommendation was for no additional imaging.  Mild shortness of breath, no chest pain with exertion. Only chest pain is rare with cough. No PND or orthopnea, no LE edema.  No family history of heart disease. Former smoker, quit 1970. Does a workout program every day, [place] [employer] [place], 6 days/week. Aerobic based activity. Occasional alcohol. Eats well, especially likes meat and vegetables. Discussed avoidance of salt and diet recommendations.  Past Medical History:  Diagnosis Date  . Anemia   . Hypertension   . INGROWN NAIL 11/10/2010   Annotation: bilateral  Qualifier: Diagnosis of  By: Arnoldo Morale MD, Balinda Quails     Past Surgical History:  Procedure Laterality Date  . CIRCUMCISION     age 84    Current Medications: Current Meds  Medication Sig  . atorvastatin  (LIPITOR) 10 MG tablet TAKE 1 TABLET BY MOUTH  DAILY  . losartan-hydrochlorothiazide (HYZAAR) 100-25 MG tablet Take 1 tablet by mouth daily.     Allergies:   Patient has no known allergies.   Social History   Socioeconomic History  . Marital status: Married    Spouse name: Not on file  . Number of children: Not on file  . Years of education: Not on file  . Highest education level: Not on file  Occupational History  . Not on file  Social Needs  . Financial resource strain: Not on file  . Food insecurity:    Worry: Not on file    Inability: Not on file  . Transportation needs:    Medical: Not on file    Non-medical: Not on file  Tobacco Use  . Smoking status: Former Smoker    Packs/day: 0.50    Years: 3.00    Pack years: 1.50    Last attempt to quit: 10/11/1968    Years since quitting: 49.5  . Smokeless tobacco: Never Used  Substance and Sexual Activity  . Alcohol use: Yes    Alcohol/week: 3.0 oz    Types: 1 Shots of liquor, 4 Standard drinks or equivalent per week    Comment: once a week   . Drug use: No  . Sexual activity: Not on file  Lifestyle  . Physical activity:    Days per week: Not on file  Minutes per session: Not on file  . Stress: Not on file  Relationships  . Social connections:    Talks on phone: Not on file    Gets together: Not on file    Attends religious service: Not on file    Active member of club or organization: Not on file    Attends meetings of clubs or organizations: Not on file    Relationship status: Not on file  Other Topics Concern  . Not on file  Social History Narrative   Married (3rd marriage). 5 children (3 from 1st, 2 from second), 7 grandkids.       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 There is no history of Colon cancer. No other family history of heart disease.  ROS:   Please  see the history of present illness.  Additional pertinent ROS: Review of Systems - General ROS: positive for  - fatigue, fever and malaise negative for - chills or night sweats Ophthalmic ROS: negative for - decreased vision or photophobia ENT ROS: negative for - headaches, hearing change, nasal congestion, sinus pain, sore throat, tinnitus or vertigo Allergy and Immunology ROS: negative for - nasal congestion or postnasal drip Hematological and Lymphatic ROS: negative for - bleeding problems, blood clots, bruising, jaundice or pallor Endocrine ROS: negative for - hot flashes or polydipsia/polyuria Respiratory ROS: no cough, shortness of breath, or wheezing Cardiovascular ROS: no chest pain or dyspnea on exertion Genito-Urinary ROS: no dysuria, trouble voiding, or hematuria  EKGs/Labs/Other Studies Reviewed:    The following studies were reviewed today: EKG 01-13-10: normal sinus rhythm  EKG:  EKG is ordered today.  The ekg ordered today demonstrates ectopic atrial tachycardia with a rate of 107 bpm  Recent Labs: 02/22/2018: ALT 27; BUN 22; Creatinine, Ser 1.49; Hemoglobin 13.9; Platelets 145.0; Potassium 4.0; Sodium 138; TSH 3.10  Recent Lipid Panel    Component Value Date/Time   CHOL 205 (H) 02/22/2018 0823   TRIG 141.0 02/22/2018 0823   HDL 36.60 (L) 02/22/2018 0823   CHOLHDL 6 02/22/2018 0823   VLDL 28.2 02/22/2018 0823   LDLCALC 140 (H) 02/22/2018 0823   LDLDIRECT 82.0 08/05/2015 0929    Physical Exam:    VS:  BP 106/78   Pulse (!) 107   Ht 6' (1.829 m)   Wt 241 lb 9.6 oz (109.6 kg)   BMI 32.77 kg/m     Wt Readings from Last 3 Encounters:  04/19/18 241 lb 9.6 oz (109.6 kg)  04/18/18 243 lb (110.2 kg)  02/16/18 258 lb (117 kg)    GEN: Well nourished, well developed in no acute distress HEENT: Normal NECK: No JVD; No carotid bruits LYMPHATICS: No lymphadenopathy CARDIAC: regular rhythm, normal S1 and S2, no murmurs, rubs, gallops RESPIRATORY:  Clear to auscultation  without rales, wheezing or rhonchi  ABDOMEN: Soft, non-tender, non-distended MUSCULOSKELETAL:  No edema; No deformity  SKIN: Warm and dry NEUROLOGIC:  Alert and oriented x 3 PSYCHIATRIC:  Normal affect   ASSESSMENT:    1. Abdominal aortic aneurysm (AAA) without rupture ([place])   2. Fatigue, unspecified type   3. Fever, unspecified fever cause   4. Lethargy   5. Counseling on health promotion and disease prevention    PLAN:    In order of problems listed above:  1. AAA:  -per most recent results, not seen  on imaging. Recommendation is for no further screening  2. Fever, fatigue, lethargy -concern for acute process, question viral syndrome given high fever. However, no associated symptoms, no URI/GI issues. Poor PO intake.  -will check CBC and CMP to look for elevated white count, check kidney function and electrolytes, and rule out hepatitis -he is going to get an urgent visit with his PCP for follow up  3. Hypertension: -blood pressure on the low end. Was even lower on initial presentation. Recommend holding anithypertensives until he is beyond his acute illness, then resume.  4. Cardiovascular risk prevention: -counseled on diet, exercise, activity. He does well when he is not ill, encouraged the continuation of exercise. Avoid salt in diet. -tolerating atorvastatin, continue. Lipids done in may showed Tchol 205, HDL 36, LDL 141. -A1c 5.5 in may  Can follow up with me PRN.  Medication Adjustments/Labs and Tests Ordered: Current medicines are reviewed at length with the patient today.  Concerns regarding medicines are outlined above.  Orders Placed This Encounter  Procedures  . CBC  . Comprehensive metabolic panel  . EKG 12-Lead   No orders of the defined types were placed in this encounter.   Patient Instructions  Medication Instructions: Hold Losartan-HCTZ 100-25 mg until you see Primary Care Physican   If you need a refill on your cardiac medications before your  next appointment, please call your pharmacy.   Labwork: Your physician recommends that you return for lab work in: Today ( CMP, CBC)   Procedures/Testing: None  Follow-Up: Your physician wants you to follow-up in As needed with Dr. Harrell Gave. Call our office at [phone] to schedule appointment.   Special Instructions:    Thank you for choosing Heartcare at [facility]!!        Signed, Buford Dresser, MD PhD 04/19/2018 10:22 AM    [place]

## 2018-04-18 NOTE — Progress Notes (Signed)
NEUROLOGY CONSULTATION NOTE  Caleb Horton MRN: [id number] DOB: 01-01-1946  Referring provider: Dr. Yong Channel Primary care provider: Dr. Yong Channel  Reason for consult:  neuropathy  HISTORY OF PRESENT ILLNESS: Caleb Horton is a 72 year old left-handed male with hypertension, CKD stage III, hyperlipdidemia, hyperlipidemia, BPH, sickle cell trait, and history of prostate cancer, thrombocytopenia and who presents for numbness in the feet.  History supplemented by referring provider's note.  He has had numbness in the feet since around 2015 or 2016.  It started after use of statin but persisted after discontinuation.  He reports symmetric numbness on the balls, sides and toes of both feet.  There is no associated pain, paresthesias or radicular pain from back.  He underwent workup.  SPEP/IFE showed elevated gamma globulin and IgG level but no monoclonal protein was detected.  He was evaluated and followed by heme/onc for a while and he was diagnosed with a polyclonal gammopathy (MGUS or multiple myeloma was not felt likely).  He was reportedly found to have a positive ANA and cANCA  Rheumatology referral was recommended.  In 2015, he had an elevated A1c of 5.7.  A1c from 02/22/18 was 5.5.  TSH was 3.10.  B12 was 768.  CMP showed slightly elevated glucose of 108, elevated protein of 9.6 and GFR of 59.85.  PAST MEDICAL HISTORY: Past Medical History:  Diagnosis Date  . Anemia   . Hypertension   . INGROWN NAIL 11/10/2010   Annotation: bilateral  Qualifier: Diagnosis of  By: Arnoldo Morale MD, Balinda Quails     PAST SURGICAL HISTORY: Past Surgical History:  Procedure Laterality Date  . CIRCUMCISION     age 56    MEDICATIONS: Current Outpatient Medications on File Prior to Visit  Medication Sig Dispense Refill  . atorvastatin (LIPITOR) 10 MG tablet TAKE 1 TABLET BY MOUTH  DAILY 90 tablet 3  . losartan-hydrochlorothiazide (HYZAAR) 100-25 MG tablet Take 1 tablet by mouth daily. 14 tablet 0   No current  facility-administered medications on file prior to visit.     ALLERGIES: No Known Allergies  FAMILY HISTORY: Family History  Problem Relation Age of Onset  . Hypertension Father   . Colon cancer Neg Hx     SOCIAL HISTORY: Social History   Socioeconomic History  . Marital status: Married    Spouse name: Not on file  . Number of children: Not on file  . Years of education: Not on file  . Highest education level: Not on file  Occupational History  . Not on file  Social Needs  . Financial resource strain: Not on file  . Food insecurity:    Worry: Not on file    Inability: Not on file  . Transportation needs:    Medical: Not on file    Non-medical: Not on file  Tobacco Use  . Smoking status: Former Smoker    Packs/day: 0.50    Years: 3.00    Pack years: 1.50    Last attempt to quit: 10/11/1968    Years since quitting: 49.5  . Smokeless tobacco: Never Used  Substance and Sexual Activity  . Alcohol use: Yes    Alcohol/week: 3.0 oz    Types: 1 Shots of liquor, 4 Standard drinks or equivalent per week    Comment: once a week   . Drug use: No  . Sexual activity: Not on file  Lifestyle  . Physical activity:    Days per week: Not on file    Minutes  per session: Not on file  . Stress: Not on file  Relationships  . Social connections:    Talks on phone: Not on file    Gets together: Not on file    Attends religious service: Not on file    Active member of club or organization: Not on file    Attends meetings of clubs or organizations: Not on file    Relationship status: Not on file  . Intimate partner violence:    Fear of current or ex partner: Not on file    Emotionally abused: Not on file    Physically abused: Not on file    Forced sexual activity: Not on file  Other Topics Concern  . Not on file  Social History Narrative   Married (3rd marriage). 5 children (3 from 1st, 2 from second), 7 grandkids.       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 class daily    REVIEW OF SYSTEMS: Constitutional: No fevers, chills, or sweats, no generalized fatigue, change in appetite Eyes: No visual changes, double vision, eye pain Ear, nose and throat: No hearing loss, ear pain, nasal congestion, sore throat Cardiovascular: No chest pain, palpitations Respiratory:  No shortness of breath at rest or with exertion, wheezes GastrointestinaI: No nausea, vomiting, diarrhea, abdominal pain, fecal incontinence Genitourinary:  No dysuria, urinary retention or frequency Musculoskeletal:  No neck pain, back pain Integumentary: No rash, pruritus, skin lesions Neurological: as above Psychiatric: No depression, insomnia, anxiety Endocrine: No palpitations, fatigue, diaphoresis, mood swings, change in appetite, change in weight, increased thirst Hematologic/Lymphatic:  No purpura, petechiae. Allergic/Immunologic: no itchy/runny eyes, nasal congestion, recent allergic reactions, rashes  PHYSICAL EXAM: Vitals:   04/18/18 1419  BP: 96/64  Pulse: (!) 111  SpO2: 95%   General: No acute distress.  Patient appears well-groomed.  Head:  Normocephalic/atraumatic Eyes:  fundi examined but not visualized Neck: supple, no paraspinal tenderness, full range of motion Back: No paraspinal tenderness Heart: regular rate and rhythm Lungs: Clear to auscultation bilaterally. Vascular: No carotid bruits. Neurological Exam: Mental status: alert and oriented to person, place, and time, recent and remote memory intact, fund of knowledge intact, attention and concentration intact, speech fluent and not dysarthric, language intact. Cranial nerves: CN I: not tested CN II: pupils equal, round and reactive to light, visual fields intact CN III, IV, VI:  full range of motion, no nystagmus, no ptosis CN V: facial sensation intact CN VII: upper and lower face symmetric CN VIII: hearing intact CN IX, X: gag intact, uvula midline CN XI:  sternocleidomastoid and trapezius muscles intact CN XII: tongue midline Bulk & Tone: normal, no fasciculations. Motor:  5/5 throughout  Sensation:  Pinprick sensation reduced in feet up to ankle and vibration sensation reduced up to knees. Deep Tendon Reflexes:  absent throughout, toes downgoing.  Finger to nose testing:  Without dysmetria.  Heel to shin:  Without dysmetria.  Gait:  Normal station and stride.  Able to turn, some struggle with tandem walk. Romberg negative.  IMPRESSION: Idiopathic polyneuropathy  PLAN: 1.  We will repeat some labs such as ANA, ANCA, as well as B6, folate and ACE 2.  We will check NCV-EMG of lower extremities 3.  As he does not experience pain, no medications are recommended. 4.  Further recommendations pending results.  Thank you for allowing me to take part in the care of this patient.  Metta Clines, DO  CC:  Dr.  Garret Reddish

## 2018-04-19 ENCOUNTER — Ambulatory Visit: Payer: Self-pay | Admitting: *Deleted

## 2018-04-19 ENCOUNTER — Ambulatory Visit (INDEPENDENT_AMBULATORY_CARE_PROVIDER_SITE_OTHER): Payer: Medicare Other | Admitting: Cardiology

## 2018-04-19 ENCOUNTER — Encounter: Payer: Self-pay | Admitting: Cardiology

## 2018-04-19 VITALS — BP 106/78 | HR 107 | Ht 72.0 in | Wt 241.6 lb

## 2018-04-19 DIAGNOSIS — I714 Abdominal aortic aneurysm, without rupture, unspecified: Secondary | ICD-10-CM

## 2018-04-19 DIAGNOSIS — Z7189 Other specified counseling: Secondary | ICD-10-CM

## 2018-04-19 DIAGNOSIS — R509 Fever, unspecified: Secondary | ICD-10-CM

## 2018-04-19 DIAGNOSIS — R5383 Other fatigue: Secondary | ICD-10-CM | POA: Diagnosis not present

## 2018-04-19 LAB — CBC
HEMATOCRIT: 37 % — AB (ref 37.5–51.0)
HEMOGLOBIN: 12.6 g/dL — AB (ref 13.0–17.7)
MCH: 28.3 pg (ref 26.6–33.0)
MCHC: 34.1 g/dL (ref 31.5–35.7)
MCV: 83 fL (ref 79–97)
Platelets: 220 10*3/uL (ref 150–450)
RBC: 4.45 x10E6/uL (ref 4.14–5.80)
RDW: 14.6 % (ref 12.3–15.4)
WBC: 12 10*3/uL — ABNORMAL HIGH (ref 3.4–10.8)

## 2018-04-19 LAB — COMPREHENSIVE METABOLIC PANEL
ALBUMIN: 3.5 g/dL (ref 3.5–4.8)
ALK PHOS: 74 IU/L (ref 39–117)
ALT: 39 IU/L (ref 0–44)
AST: 40 IU/L (ref 0–40)
Albumin/Globulin Ratio: 0.7 — ABNORMAL LOW (ref 1.2–2.2)
BUN / CREAT RATIO: 19 (ref 10–24)
BUN: 35 mg/dL — ABNORMAL HIGH (ref 8–27)
Bilirubin Total: 0.7 mg/dL (ref 0.0–1.2)
CO2: 20 mmol/L (ref 20–29)
CREATININE: 1.81 mg/dL — AB (ref 0.76–1.27)
Calcium: 9.9 mg/dL (ref 8.6–10.2)
Chloride: 98 mmol/L (ref 96–106)
GFR calc Af Amer: 43 mL/min/{1.73_m2} — ABNORMAL LOW (ref >59)
GFR calc non Af Amer: 37 mL/min/{1.73_m2} — ABNORMAL LOW (ref >59)
GLOBULIN, TOTAL: 5.1 g/dL — AB (ref 1.5–4.5)
Glucose: 116 mg/dL — ABNORMAL HIGH (ref 65–99)
Potassium: 3.7 mmol/L (ref 3.5–5.2)
SODIUM: 133 mmol/L — AB (ref 134–144)
Total Protein: 8.6 g/dL — ABNORMAL HIGH (ref 6.0–8.5)

## 2018-04-19 LAB — ANCA SCREEN W REFLEX TITER: ANCA Screen: POSITIVE — AB

## 2018-04-19 NOTE — Patient Instructions (Signed)
Medication Instructions: Hold Losartan-HCTZ 100-25 mg until you see Primary Care Physican   If you need a refill on your cardiac medications before your next appointment, please call your pharmacy.   Labwork: Your physician recommends that you return for lab work in: Today ( CMP, CBC)   Procedures/Testing: None  Follow-Up: Your physician wants you to follow-up in As needed with Dr. Harrell Gave. Call our office at [phone] to schedule appointment.   Special Instructions:    Thank you for choosing Heartcare at [facility]!!

## 2018-04-19 NOTE — Telephone Encounter (Signed)
See note

## 2018-04-19 NOTE — Telephone Encounter (Signed)
Pt reports SOB with exertion, onset 7-10 days ago. Denies dizziness, CP, cough, cold symptoms; no wheezing, no edema. Pt  saw cardiologist this am. Pt states she reported  "Everything is good."  Labs ordered: CMET and CBC. Pt had drawn and results are pending. Pt states he had similar issue 10 years ago when "Blood was low."  Pt calling to determine process of getting results. Discussed with pt, verbalizes understanding. Informed pt NT would alert Dr. Yong Channel. Directed to ED if SOB worsens, other symptoms present.  Reason for Disposition . [1] MILD difficulty breathing (e.g., minimal/no SOB at rest, SOB with walking, pulse <100) AND [2] NEW-onset or WORSE than normal    Pt saw cardiologist this am; awaiting lab results  Answer Assessment - Initial Assessment Questions 1. RESPIRATORY STATUS: "Describe your breathing?" (e.g., wheezing, shortness of breath, unable to speak, severe coughing)      Any exertion 2. ONSET: "When did this breathing problem begin?"      7-10 days ago 3. PATTERN "Does the difficult breathing come and go, or has it been constant since it started?"      Constant, worsening 4. SEVERITY: "How bad is your breathing?" (e.g., mild, moderate, severe)    - MILD: No SOB at rest, mild SOB with walking, speaks normally in sentences, can lay down, no retractions, pulse < 100.    - MODERATE: SOB at rest, SOB with minimal exertion and prefers to sit, cannot lie down flat, speaks in phrases, mild retractions, audible wheezing, pulse 100-120.    - SEVERE: Very SOB at rest, speaks in single words, struggling to breathe, sitting hunched forward, retractions, pulse > 120      Mild 5. RECURRENT SYMPTOM: "Have you had difficulty breathing before?" If so, ask: "When was the last time?" and "What happened that time?"      10 years ago when HGB was low 6. CARDIAC HISTORY: "Do you have any history of heart disease?" (e.g., heart attack, angina, bypass surgery, angioplasty)       7. LUNG HISTORY: "Do  you have any history of lung disease?"  (e.g., pulmonary embolus, asthma, emphysema)     no 8. CAUSE: "What do you think is causing the breathing problem?"      "Blood low" 9. OTHER SYMPTOMS: "Do you have any other symptoms? (e.g., dizziness, runny nose, cough, chest pain, fever)    no  Protocols used: BREATHING DIFFICULTY-A-AH

## 2018-04-20 ENCOUNTER — Telehealth: Payer: Self-pay | Admitting: *Deleted

## 2018-04-20 NOTE — Telephone Encounter (Signed)
See note

## 2018-04-20 NOTE — Telephone Encounter (Signed)
Pt calling today questioning if Dr. Yong Channel reviewed lab results of 04/19/18 which were ordered by his cardiologist; see triage of yesterday.  Pt received results via MyChart; is requesting to speak with Dr. Yong Channel to review results with him.  [phone]

## 2018-04-21 ENCOUNTER — Ambulatory Visit (INDEPENDENT_AMBULATORY_CARE_PROVIDER_SITE_OTHER): Payer: Medicare Other | Admitting: Family Medicine

## 2018-04-21 ENCOUNTER — Encounter: Payer: Self-pay | Admitting: Family Medicine

## 2018-04-21 VITALS — BP 138/80 | HR 78 | Temp 98.0°F | Ht 72.0 in | Wt 245.6 lb

## 2018-04-21 DIAGNOSIS — R5383 Other fatigue: Secondary | ICD-10-CM

## 2018-04-21 DIAGNOSIS — D72829 Elevated white blood cell count, unspecified: Secondary | ICD-10-CM

## 2018-04-21 DIAGNOSIS — R7989 Other specified abnormal findings of blood chemistry: Secondary | ICD-10-CM | POA: Diagnosis not present

## 2018-04-21 LAB — ANA: Anti Nuclear Antibody(ANA): POSITIVE — AB

## 2018-04-21 LAB — ANTI-NUCLEAR AB-TITER (ANA TITER): ANA Titer 1: >=1:1280 {titer} — AB

## 2018-04-21 LAB — ANGIOTENSIN CONVERTING ENZYME: Angiotensin-Converting Enzyme: 26 U/L (ref 9–67)

## 2018-04-21 NOTE — Patient Instructions (Addendum)
I would also like for you to sign up for an annual wellness visit with one of our nurses, Cassie or Manuela Schwartz, who both specialize in the annual wellness visit. This is a free benefit under medicare that may help [place] find additional ways to help you. Some highlights are reviewing medications, lifestyle, and doing a dementia screen.  We are checking bloodwork to make sure you are not more anemic. We are also going to see if infection fighting cells are now lower. Sounds like a viral illness but im just surprised your temperature got up to 104 with a viral non flu illness.   See [place] back for new or worsening symptoms or symptoms that last more than another week or two. Hold off on blood pressure medicine until we get results back and your appetite is back to full level

## 2018-04-21 NOTE — Telephone Encounter (Signed)
Appointment scheduled today at 4:00 PM.

## 2018-04-21 NOTE — Progress Notes (Signed)
Subjective:  Caleb Horton is a 72 y.o. year old very pleasant male patient who presents for/with See problem oriented charting ROS- no fever since earlier this week, has fatigue but improving . Shortness of breath has nearly resolved. No chest pain.   Past Medical History-  Patient Active Problem List   Diagnosis Date Noted  . Prostate cancer ([place]) 08/31/2017    Priority: High  . AAA (abdominal aortic aneurysm) ([place]) 03/17/2017    Priority: Medium  . Numbness in feet 02/07/2017    Priority: Medium  . Hyperlipidemia 09/04/2014    Priority: Medium  . CKD (chronic kidney disease), stage III ([facility]) 09/04/2014    Priority: Medium  . Essential hypertension 11/27/2007    Priority: Medium  . History of adenomatous polyp of colon 08/05/2015    Priority: Low  . Hyperglycemia 11/27/2007    Priority: Low  . Sickle-cell trait ([place]) 11/27/2007    Priority: Low  . BPH (benign prostatic hyperplasia) 11/27/2007    Priority: Low  . Thrombocytopenia ([place]) 09/19/2015  . Gammopathy 03/20/2015  . Elevated blood protein 02/04/2015    Medications- reviewed and updated Current Outpatient Medications  Medication Sig Dispense Refill  . atorvastatin (LIPITOR) 10 MG tablet TAKE 1 TABLET BY MOUTH  DAILY 90 tablet 3  . losartan-hydrochlorothiazide (HYZAAR) 100-25 MG tablet Take 1 tablet by mouth daily. 14 tablet 0   No current facility-administered medications for this visit.     Objective: BP 138/80 (BP Location: Left Arm, Patient Position: Sitting, Cuff Size: Normal)   Pulse 78   Temp 98 F (36.7 C) (Oral)   Ht 6' (1.829 m)   Wt 245 lb 9.6 oz (111.4 kg)   SpO2 95%   BMI 33.31 kg/m  Gen: NAD, resting comfortably Tm normal, oropharynx normal, nares normal CV: RRR no murmurs rubs or gallops Lungs: CTAB no crackles, wheeze, rhonchi Abdomen: soft/nontender/nondistended/normal bowel sounds. No rebound or guarding.  Ext: trace edema Skin: warm, dry Neuro: does not stand as quickly as he  normally does but normal gait, normal speech  Assessment/Plan:  Leukocytosis, unspecified type - Plan: CBC with Differential/Platelet, CBC with Differential/Platelet, CANCELED: CBC with Differential/Platelet  Elevated serum creatinine - Plan: Comprehensive metabolic panel, Comprehensive metabolic panel, CANCELED: Comprehensive metabolic panel  Fatigue, unspecified type - Plan: TSH, TSH, CANCELED: TSH S: Episode years ago where at a homecoming felt lightheaded- came in next week and bloodwork at that time and was found to have significant anemia. Got 2 units of blood at that time. Patient did not remember losing blood anywhere and no cause was found including colonoscopy and endoscopy  He was feeling winded- has to take a break climbing steps. Even going to the bathroom wears him out. Had fever 4 days ago and resolved 3 days ago or so. No diarrhea. No constipation. Has not been eating as well. Did at least keep up fluid update. He is holding losartan hctz (he states he would get a knot in his chest feeling right after taking- even with half tablet). No trouble swallowing otherwise. No trouble taking atorvastatin  States no more shortness of breath.   Hard to stand up- now improving significantly.   No tick bites recently. Cardiology did not think cardiac related.  A/P: He should continue to hold the losartan-hctz for now. Sounds like he may have had a viral illness.  Anemia could be contributing to fatigue and shortness of breath but on labs today anemia is worse but patient states symptoms better- will get  another repeat on Monday of this week- if worsening may need hospitalization or at least outpatient transfusion and prompt GI workup. I am hoping that some of the worsening anemia is dilutional.   Patients creatinine is already improving off BP meds. WBC elevation has resolved  May need to see him back next week to reevaluate.   Future Appointments  Date Time Provider [place]   05/02/2018 11:00 AM Alda Berthold, DO [facility] None  08/23/2018 11:15 AM Marin Olp, MD [facility]  02/28/2019  9:45 AM Yong Channel Brayton Mars, MD [facility]   Lab/Order associations: Leukocytosis, unspecified type - Plan: CBC with Differential/Platelet, CBC with Differential/Platelet, CANCELED: CBC with Differential/Platelet  Elevated serum creatinine - Plan: Comprehensive metabolic panel, Comprehensive metabolic panel, CANCELED: Comprehensive metabolic panel  Fatigue, unspecified type - Plan: TSH, TSH, CANCELED: TSH  Return precautions advised.  Garret Reddish, MD

## 2018-04-21 NOTE — Telephone Encounter (Signed)
Spoke to pt, told him saw you got results and that you were suppose to see PCP as soon as you could. Told pt I do not see an appt scheduled. Pt said someone was suppose to call him. Asked pt if he can come in today? Pt said yes. Told pt appt scheduled for  4:00PM to day to see Dr. Yong Channel. Pt verbalized understanding.

## 2018-04-22 LAB — CBC WITH DIFFERENTIAL/PLATELET
Basophils Absolute: 52 cells/uL (ref 0–200)
Basophils Relative: 0.6 %
EOS ABS: 435 {cells}/uL (ref 15–500)
Eosinophils Relative: 5 %
HCT: 33.2 % — ABNORMAL LOW (ref 38.5–50.0)
Hemoglobin: 10.9 g/dL — ABNORMAL LOW (ref 13.2–17.1)
Lymphs Abs: 1453 cells/uL (ref 850–3900)
MCH: 27.4 pg (ref 27.0–33.0)
MCHC: 32.8 g/dL (ref 32.0–36.0)
MCV: 83.4 fL (ref 80.0–100.0)
MONOS PCT: 10.6 %
MPV: 10.7 fL (ref 7.5–12.5)
NEUTROS PCT: 67.1 %
Neutro Abs: 5838 cells/uL (ref 1500–7800)
Platelets: 215 10*3/uL (ref 140–400)
RBC: 3.98 10*6/uL — ABNORMAL LOW (ref 4.20–5.80)
RDW: 14.1 % (ref 11.0–15.0)
TOTAL LYMPHOCYTE: 16.7 %
WBC: 8.7 10*3/uL (ref 3.8–10.8)
WBCMIX: 922 {cells}/uL (ref 200–950)

## 2018-04-22 LAB — COMPREHENSIVE METABOLIC PANEL
AG RATIO: 0.7 (calc) — AB (ref 1.0–2.5)
ALT: 28 U/L (ref 9–46)
AST: 28 U/L (ref 10–35)
Albumin: 3.2 g/dL — ABNORMAL LOW (ref 3.6–5.1)
Alkaline phosphatase (APISO): 66 U/L (ref 40–115)
BUN / CREAT RATIO: 17 (calc) (ref 6–22)
BUN: 22 mg/dL (ref 7–25)
CALCIUM: 9.7 mg/dL (ref 8.6–10.3)
CO2: 22 mmol/L (ref 20–32)
Chloride: 106 mmol/L (ref 98–110)
Creat: 1.32 mg/dL — ABNORMAL HIGH (ref 0.70–1.18)
GLUCOSE: 128 mg/dL — AB (ref 65–99)
Globulin: 4.9 g/dL (calc) — ABNORMAL HIGH (ref 1.9–3.7)
Potassium: 3.4 mmol/L — ABNORMAL LOW (ref 3.5–5.3)
Sodium: 137 mmol/L (ref 135–146)
Total Bilirubin: 0.4 mg/dL (ref 0.2–1.2)
Total Protein: 8.1 g/dL (ref 6.1–8.1)

## 2018-04-22 LAB — TSH: TSH: 2.49 m[IU]/L (ref 0.40–4.50)

## 2018-04-22 NOTE — Progress Notes (Signed)
Your CBC(blood counts, infection fighting cells, platelets) showed worsening of anemia. Team- please repeat this on Monday. If it continues to worsen he may have to have blood transfusion like he did years ago. May need to do urgent GI referral on Monday. If he has worsening fatigue or shortness of breath needs to go to hospital over weekend but since his symptoms were improving- im hoping that can be avoided. It appears he is better hydrated than last test so possibly this is some dilutional element - that's why we need another data point on Monday  White blood cells have now normalized making me think if he had an infection he is fighting it off  Kidney function is much better. Slightly low potassium- have him eat an extra banana. Remain off blood pressure medicine for now. Signs show poor nutrition but that makes sense ith how he has been sick lately  Thyroid normal

## 2018-04-23 LAB — VITAMIN B6: Vitamin B6: 8.6 ng/mL (ref 2.1–21.7)

## 2018-04-24 ENCOUNTER — Other Ambulatory Visit: Payer: Self-pay

## 2018-04-24 DIAGNOSIS — D649 Anemia, unspecified: Secondary | ICD-10-CM

## 2018-04-25 ENCOUNTER — Other Ambulatory Visit (INDEPENDENT_AMBULATORY_CARE_PROVIDER_SITE_OTHER): Payer: Medicare Other

## 2018-04-25 DIAGNOSIS — D649 Anemia, unspecified: Secondary | ICD-10-CM | POA: Diagnosis not present

## 2018-04-27 ENCOUNTER — Telehealth: Payer: Self-pay

## 2018-04-27 NOTE — Telephone Encounter (Signed)
-----   Message from Pieter Partridge, DO sent at 04/26/2018 12:36 PM EDT ----- Blood tests show positive ANA and  ANCA, which may be suggestive of an autoimmune or rheumatologic disease.  This was also positive a few years ago and it was recommended to go to rheumatology at that time.  It may cause certain neuropathies.  We will see if the nerve conduction test correlates with this type of neuropathy.

## 2018-04-27 NOTE — Telephone Encounter (Signed)
Called and spoke with Pt, advised of lab results. He will keep appt for EMG on 05/02/18

## 2018-04-28 LAB — CBC
HCT: 32 % — ABNORMAL LOW (ref 39.0–52.0)
Hemoglobin: 10.7 g/dL — ABNORMAL LOW (ref 13.0–17.0)
MCHC: 33.5 g/dL (ref 30.0–36.0)
MCV: 82.8 fl (ref 78.0–100.0)
PLATELETS: 286 10*3/uL (ref 150.0–400.0)
RBC: 3.87 Mil/uL — ABNORMAL LOW (ref 4.22–5.81)
RDW: 15.2 % (ref 11.5–15.5)
WBC: 7.6 10*3/uL (ref 4.0–10.5)

## 2018-05-02 ENCOUNTER — Ambulatory Visit (INDEPENDENT_AMBULATORY_CARE_PROVIDER_SITE_OTHER): Payer: Medicare Other | Admitting: Neurology

## 2018-05-02 DIAGNOSIS — G609 Hereditary and idiopathic neuropathy, unspecified: Secondary | ICD-10-CM | POA: Diagnosis not present

## 2018-05-02 DIAGNOSIS — G629 Polyneuropathy, unspecified: Secondary | ICD-10-CM | POA: Diagnosis not present

## 2018-05-02 NOTE — Procedures (Signed)
[facility]  [place], [place]  [place], [place] [address] Tel: [phone] Fax:  [phone] Test Date:  05/02/2018  Patient: Caleb Horton DOB: [birth year] Physician: Narda Amber, DO  Sex: Male Height: 6\' 0"  Ref Phys: Metta Clines, DO  ID#: [id number] Temp: 35.3C Technician:    Patient Complaints: This is a 72 year old man with bilateral feet numbness referred for evaluation of peripheral neuropathy.  NCV & EMG Findings: Extensive electrodiagnostic testing of the right lower extremity and additional studies of the left shows:  1. Bilateral sural and superficial peroneal sensory responses are absent. 2. Bilateral peroneal motor responses are absent at the extensor digitorum brevis, and normal at the tibialis anterior. The left tibial motor response shows mildly reduced amplitude and bilateral tibial motor responses show slowed conduction velocity. 3. Bilateral tibial H reflex studies are absent. 4. Chronic motor axon loss changes are seen affecting the muscles below the knee bilaterally, without need active denervation.  Impression: The electrophysiologic findings are most consistent with a chronic sensorimotor, predominantly axonal, polyneuropathy affecting the lower extremities.   ___________________________ Narda Amber, DO    Nerve Conduction Studies Anti Sensory Summary Table   Site NR Peak (ms) Norm Peak (ms) P-T Amp (V) Norm P-T Amp  Left Sup Peroneal Anti Sensory (Ant Lat Mall)  35.3C  12 cm NR  <4.6  >3  Right Sup Peroneal Anti Sensory (Ant Lat Mall)  35.3C  12 cm NR  <4.6  >3  Left Sural Anti Sensory (Lat Mall)  35.3C  Calf NR  <4.6  >3  Right Sural Anti Sensory (Lat Mall)  35.3C  Calf NR  <4.6  >3   Motor Summary Table   Site NR Onset (ms) Norm Onset (ms) O-P Amp (mV) Norm O-P Amp Site1 Site2 Delta-0 (ms) Dist (cm) Vel (m/s) Norm Vel (m/s)  Left Peroneal Motor (Ext Dig Brev)  35.3C  Ankle NR  <6.0  >2.5 B Fib Ankle  0.0  >40  B Fib  NR     Poplt B Fib  0.0  >40  Poplt NR            Right Peroneal Motor (Ext Dig Brev)  35.3C  Ankle NR  <6.0  >2.5 B Fib Ankle  0.0  >40  B Fib NR     Poplt B Fib  0.0  >40  Poplt NR            Left Peroneal TA Motor (Tib Ant)  35.3C  Fib Head    2.2 <4.5 5.8 >3 Poplit Fib Head 1.7 9.0 53 >40  Poplit    3.9  5.4         Right Peroneal TA Motor (Tib Ant)  35.3C  Fib Head    2.9 <4.5 6.5 >3 Poplit Fib Head 1.5 10.0 67 >40  Poplit    4.4  6.3         Left Tibial Motor (Abd Hall Brev)  35.3C  Ankle    3.4 <6.0 3.7 >4 Knee Ankle 13.4 45.0 34 >40  Knee    16.8  1.2         Right Tibial Motor (Abd Hall Brev)  35.3C  Ankle    3.8 <6.0 5.2 >4 Knee Ankle 14.0 45.0 32 >40  Knee    17.8  1.8          H Reflex Studies   NR H-Lat (ms) Lat Norm (ms) L-R H-Lat (ms)  Left Tibial (Gastroc)  35.3C  NR  <35   Right Tibial (Gastroc)  35.3C  NR  <35    EMG   Side Muscle Ins Act Fibs Psw Fasc Number Recrt Dur Dur. Amp Amp. Poly Poly. Comment  Right AntTibialis Nml Nml Nml Nml 1- Rapid Some 1+ Some 1+ Some 1+ N/A  Right Gastroc Nml Nml Nml Nml 1- Rapid Few 1+ Few 1+ Nml Nml N/A  Right Flex Dig Long Nml Nml Nml Nml 1- Rapid Some 1+ Some 1+ Nml Nml N/A  Right RectFemoris Nml Nml Nml Nml Nml Nml Nml Nml Nml Nml Nml Nml N/A  Right GluteusMed Nml Nml Nml Nml Nml Nml Nml Nml Nml Nml Nml Nml N/A  Right BicepsFemS Nml Nml Nml Nml Nml Nml Nml Nml Nml Nml Nml Nml N/A  Left BicepsFemS Nml Nml Nml Nml Nml Nml Nml Nml Nml Nml Nml Nml N/A  Left AntTibialis Nml Nml Nml Nml 1- Rapid Some 1+ Some 1+ Some 1+ N/A  Left Gastroc Nml Nml Nml Nml 1- Rapid Few 1+ Few 1+ Nml Nml N/A  Left Flex Dig Long Nml Nml Nml Nml 1- Rapid Some 1+ Some 1+ Nml Nml N/A  Left RectFemoris Nml Nml Nml Nml Nml Nml Nml Nml Nml Nml Nml Nml N/A  Left GluteusMed Nml Nml Nml Nml Nml Nml Nml Nml Nml Nml Nml Nml N/A      Waveforms:

## 2018-05-03 ENCOUNTER — Other Ambulatory Visit: Payer: Self-pay

## 2018-05-03 ENCOUNTER — Telehealth: Payer: Self-pay | Admitting: Family Medicine

## 2018-05-03 DIAGNOSIS — D649 Anemia, unspecified: Secondary | ICD-10-CM

## 2018-05-03 NOTE — Telephone Encounter (Signed)
Returning call Copied from [place] [id number]. Topic: Quick Communication - Lab Results >> May 03, 2018  9:46 AM Harriett Rush, CMA wrote: Called patient to inform them of 04/25/2018 lab results. When patient returns call, triage nurse may disclose results.

## 2018-05-04 ENCOUNTER — Telehealth: Payer: Self-pay

## 2018-05-04 NOTE — Telephone Encounter (Signed)
-----   Message from Pieter Partridge, DO sent at 05/02/2018  4:05 PM EDT ----- Nerve study confirms a neuropathy, the most common type.  So no further labs are warranted at this time.

## 2018-05-04 NOTE — Telephone Encounter (Signed)
Patient notified, see lab result note.

## 2018-05-04 NOTE — Telephone Encounter (Signed)
Called and spoke with Pt, advised of EMG results 

## 2018-05-08 ENCOUNTER — Other Ambulatory Visit (INDEPENDENT_AMBULATORY_CARE_PROVIDER_SITE_OTHER): Payer: Medicare Other

## 2018-05-08 DIAGNOSIS — D649 Anemia, unspecified: Secondary | ICD-10-CM

## 2018-05-08 LAB — CBC
HCT: 34.9 % — ABNORMAL LOW (ref 39.0–52.0)
Hemoglobin: 11.7 g/dL — ABNORMAL LOW (ref 13.0–17.0)
MCHC: 33.5 g/dL (ref 30.0–36.0)
MCV: 84.1 fl (ref 78.0–100.0)
Platelets: 147 10*3/uL — ABNORMAL LOW (ref 150.0–400.0)
RBC: 4.15 Mil/uL — ABNORMAL LOW (ref 4.22–5.81)
RDW: 16.7 % — AB (ref 11.5–15.5)
WBC: 5.6 10*3/uL (ref 4.0–10.5)

## 2018-05-10 ENCOUNTER — Other Ambulatory Visit: Payer: Self-pay

## 2018-05-10 DIAGNOSIS — D649 Anemia, unspecified: Secondary | ICD-10-CM

## 2018-05-22 ENCOUNTER — Other Ambulatory Visit: Payer: Medicare Other

## 2018-05-23 ENCOUNTER — Ambulatory Visit (INDEPENDENT_AMBULATORY_CARE_PROVIDER_SITE_OTHER): Payer: Medicare Other

## 2018-05-23 ENCOUNTER — Ambulatory Visit (INDEPENDENT_AMBULATORY_CARE_PROVIDER_SITE_OTHER): Payer: Medicare Other | Admitting: Family Medicine

## 2018-05-23 ENCOUNTER — Encounter: Payer: Self-pay | Admitting: Family Medicine

## 2018-05-23 ENCOUNTER — Other Ambulatory Visit (INDEPENDENT_AMBULATORY_CARE_PROVIDER_SITE_OTHER): Payer: Medicare Other

## 2018-05-23 VITALS — BP 142/80 | HR 81 | Temp 98.2°F | Ht 72.0 in | Wt 245.8 lb

## 2018-05-23 VITALS — BP 172/100 | HR 74 | Ht 72.0 in | Wt 245.8 lb

## 2018-05-23 DIAGNOSIS — D649 Anemia, unspecified: Secondary | ICD-10-CM

## 2018-05-23 DIAGNOSIS — I10 Essential (primary) hypertension: Secondary | ICD-10-CM | POA: Diagnosis not present

## 2018-05-23 DIAGNOSIS — Z0000 Encounter for general adult medical examination without abnormal findings: Secondary | ICD-10-CM | POA: Diagnosis not present

## 2018-05-23 LAB — CBC
HEMATOCRIT: 36.7 % — AB (ref 39.0–52.0)
Hemoglobin: 12.3 g/dL — ABNORMAL LOW (ref 13.0–17.0)
MCHC: 33.5 g/dL (ref 30.0–36.0)
MCV: 83.5 fl (ref 78.0–100.0)
Platelets: 184 10*3/uL (ref 150.0–400.0)
RBC: 4.4 Mil/uL (ref 4.22–5.81)
RDW: 17.2 % — AB (ref 11.5–15.5)
WBC: 5.7 10*3/uL (ref 4.0–10.5)

## 2018-05-23 MED ORDER — TELMISARTAN-HCTZ 40-12.5 MG PO TABS: 1.0000 | ORAL_TABLET | Freq: Every day | ORAL | 5 refills | Status: DC

## 2018-05-23 NOTE — Progress Notes (Signed)
Subjective:  Caleb Horton is a 72 y.o. year old very pleasant male patient who presents for/with See problem oriented charting ROS- No chest pain (outside of taking losartan- hctz) or shortness of breath. No headache or blurry vision.    Past Medical History-  Patient Active Problem List   Diagnosis Date Noted  . Prostate cancer ([place]) 08/31/2017    Priority: High  . AAA (abdominal aortic aneurysm) ([place]) 03/17/2017    Priority: Medium  . Numbness in feet 02/07/2017    Priority: Medium  . Hyperlipidemia 09/04/2014    Priority: Medium  . CKD (chronic kidney disease), stage III ([place]) 09/04/2014    Priority: Medium  . Essential hypertension 11/27/2007    Priority: Medium  . History of adenomatous polyp of colon 08/05/2015    Priority: Low  . Hyperglycemia 11/27/2007    Priority: Low  . Sickle-cell trait ([place]) 11/27/2007    Priority: Low  . BPH (benign prostatic hyperplasia) 11/27/2007    Priority: Low  . Thrombocytopenia ([place]) 09/19/2015  . Gammopathy 03/20/2015  . Elevated blood protein 02/04/2015    Medications- reviewed and updated Current Outpatient Medications  Medication Sig Dispense Refill  . atorvastatin (LIPITOR) 10 MG tablet TAKE 1 TABLET BY MOUTH  DAILY 90 tablet 3  . losartan-hydrochlorothiazide (HYZAAR) 100-25 MG tablet Take 1 tablet by mouth daily. 14 tablet 0   No current facility-administered medications for this visit.     Objective: BP (!) 142/80 (BP Location: Left Arm, Patient Position: Sitting, Cuff Size: Large)   Pulse 81   Temp 98.2 F (36.8 C) (Oral)   Ht 6' (1.829 m)   Wt 245 lb 12.8 oz (111.5 kg)   SpO2 97%   BMI 33.34 kg/m  Gen: NAD, resting comfortably, relaxed CV: RRR no murmurs rubs or gallops Lungs: CTAB no crackles, wheeze, rhonchi Abdomen: soft/nontender/nondistended Ext: no edema Skin: warm, dry Neuro: speech normal, moves all extremities  Assessment/Plan:  Left foot edema S: Was working out at [employer] and hurt his left  foot about 2 months ago- swelling in left foot worsened since then (equal at ankles). No redness on foot. He asks for antibiotic to "clear it up"   Didn't know he hurt the foot due to neuropathy until started having pain later. Hurt on ball of foot. Went to right foot once favored the right foot then switched back to the left foot when he favored the left foot.  A/P: I see no signs of infection. No calf pain plus edema equal at the ankles- doubt DVT. Restarting diuretic in HCTZ may help some. If pain worsens or persists could get films of calcaneus at follow up though doubt calcaneal stress fracture.   Essential hypertension S: controlled on  olmesartan-hctz 40-25mg  previously--> had to change to losartan hctz 100-25mg . He was off med at last visit - he was having some lightheadedness at that time and we worried about viral illness so we held the medication.   Even before he was ill-Patient states when he takes the losartan-hctz he would get upper chest pain when he would take the pill- would last half a day. Cutting down to half tablet didn't help- still had chest pain- described as knot in chest for still half day. He stopped taking the medicine before last visit.   He was seen for wellness visit today and BP had skyrocketed to 172/100. Patient was fasting- went home and got some food (was asymptomatic when left) and when came back BP had improved  significantly BP Readings from Last 3 Encounters:  05/23/18 (!) 142/80  05/23/18 (!) 172/100- extremely elevated at [facility] today  04/21/18 138/80  A/P:  blood pressure goal of <140/90. Stop losartan-hczt (or should say continue off of this). BP not far off goal on repeat today. Will try telmisartan 40-hctz 12.5mg  combo pill with 2-4 week follow up but he will let me know if recurrent symptoms of the upper chest tightness on medicine (please note that it was never reported as exertional symptoms).    Future Appointments  Date Time Provider [place]  06/13/2018  2:15 PM Marin Olp, MD [facility]  08/23/2018 11:15 AM Marin Olp, MD [facility]  02/28/2019  9:45 AM Yong Channel Brayton Mars, MD [facility]    Meds ordered this encounter  Medications  . telmisartan-hydrochlorothiazide (MICARDIS HCT) 40-12.5 MG tablet    Sig: Take 1 tablet by mouth daily.    Dispense:  30 tablet    Refill:  5    Return precautions advised.  Garret Reddish, MD

## 2018-05-23 NOTE — Patient Instructions (Signed)
Caleb Horton , Thank you for taking time to come for your Medicare Wellness Visit. I appreciate your ongoing commitment to your health goals. Please review the following plan we discussed and let me know if I can assist you in the future.   These are the goals we discussed: Goals    . patient     To fup on foot numbness; stops him from bowling; difficulty sliding     . Weight (lb) < 200 lb (90.7 kg)     Patient would at least like to lose 25lbs in the next year. We discussed losing 2-3lbs a month to make a lifestyle change versus a quick weight loss and gaining weight back       This is a list of the screening recommended for you and due dates:  Health Maintenance  Topic Date Due  . Flu Shot  05/11/2018  .  Hepatitis C: One time screening is recommended by Center for Disease Control  (CDC) for  adults born from [birth year] through 1964.   10/08/2098*  . Colon Cancer Screening  06/25/2020  . Tetanus Vaccine  03/17/2028  . Pneumonia vaccines  Completed  *Topic was postponed. The date shown is not the original due date.   Preventive Care for Adults  A healthy lifestyle and preventive care can promote health and wellness. Preventive health guidelines for adults include the following key practices.  . A routine yearly physical is a good way to check with your health care provider about your health and preventive screening. It is a chance to share any concerns and updates on your health and to receive a thorough exam.  . Visit your dentist for a routine exam and preventive care every 6 months. Brush your teeth twice a day and floss once a day. Good oral hygiene prevents tooth decay and gum disease.  . The frequency of eye exams is based on your age, health, family medical history, use  of contact lenses, and other factors. Follow your health care provider's recommendations for frequency of eye exams.  . Eat a healthy diet. Foods like vegetables, fruits, whole grains, low-fat dairy products,  and lean protein foods contain the nutrients you need without too many calories. Decrease your intake of foods high in solid fats, added sugars, and salt. Eat the right amount of calories for you. Get information about a proper diet from your health care provider, if necessary.  . Regular physical exercise is one of the most important things you can do for your health. Most adults should get at least 150 minutes of moderate-intensity exercise (any activity that increases your heart rate and causes you to sweat) each week. In addition, most adults need muscle-strengthening exercises on 2 or more days a week.  Silver Sneakers may be a benefit available to you. To determine eligibility, you may visit the website: www.silversneakers.com or contact program at 541-533-1342 Mon-Fri between 8AM-8PM.   . Maintain a healthy weight. The body mass index (BMI) is a screening tool to identify possible weight problems. It provides an estimate of body fat based on height and weight. Your health care provider can find your BMI and can help you achieve or maintain a healthy weight.   For adults 20 years and older: ? A BMI below 18.5 is considered underweight. ? A BMI of 18.5 to 24.9 is normal. ? A BMI of 25 to 29.9 is considered overweight. ? A BMI of 30 and above is considered obese.   . Maintain normal  blood lipids and cholesterol levels by exercising and minimizing your intake of saturated fat. Eat a balanced diet with plenty of fruit and vegetables. Blood tests for lipids and cholesterol should begin at age 88 and be repeated every 5 years. If your lipid or cholesterol levels are high, you are over 50, or you are at high risk for heart disease, you may need your cholesterol levels checked more frequently. Ongoing high lipid and cholesterol levels should be treated with medicines if diet and exercise are not working.  . If you smoke, find out from your health care provider how to quit. If you do not use tobacco,  please do not start.  . If you choose to drink alcohol, please do not consume more than 2 drinks per day. One drink is considered to be 12 ounces (355 mL) of beer, 5 ounces (148 mL) of wine, or 1.5 ounces (44 mL) of liquor.  . If you are 29-73 years old, ask your health care provider if you should take aspirin to prevent strokes.  . Use sunscreen. Apply sunscreen liberally and repeatedly throughout the day. You should seek shade when your shadow is shorter than you. Protect yourself by wearing long sleeves, pants, a wide-brimmed hat, and sunglasses year round, whenever you are outdoors.  . Once a month, do a whole body skin exam, using a mirror to look at the skin on your back. Tell your health care provider of new moles, moles that have irregular borders, moles that are larger than a pencil eraser, or moles that have changed in shape or color.

## 2018-05-23 NOTE — Progress Notes (Signed)
I have reviewed and agree with note, evaluation, plan. See my separate note- he is on atorvastatin only. He stopped losartan-hctz due to knot in chest sensation for hours after taking. We discussed BP (which improved) as well as foot issues.   Garret Reddish, MD

## 2018-05-23 NOTE — Progress Notes (Signed)
PCP notes: Last OV 04/21/18   Health maintenance:Schedule Flu shot for this Fall   Abnormal screenings: Blood Pressure today is 172/100. Patient denies, dizziness, head ache, blurred vision. Spoke with Dr. Yong Channel. Patient placed on Dr. Ansel Bong schedule today for evaluation.   Patient concerns: Neuropathy in his feet. Thinks he has injured his feet currently. Feet swollen Bilaterally. Scheduled with Dr. Yong Channel   Nurse concerns:Patient not 100% sure what medications he is taking. Says he I son a replacement for Lipitor and stopped taking the other medication due to chest pain   Next PCP appt: 08/23/18

## 2018-05-23 NOTE — Progress Notes (Signed)
Subjective:   Caleb Horton is a 72 y.o. male who presents for Medicare Annual/Subsequent preventive examination.  Review of Systems:  No ROS.  Medicare Wellness Visit. Additional risk factors are reflected in the social history.  Patient currently lives in a 3 story house with wife Bertram Millard of 47 years. No pets. 2 children live in town. Likes to travel with wife to beach and mountains. Patient likes to work out, he joined the [employer]. Used to enjoy baking but gave it up due to gaining weight.  Patient goes to bed at 10pm. Gets up about 1 time a night to go to the bathroom. Gets up around 6:30 in the morning. Feels rested when he wakes up. Objective:    Vitals: There were no vitals taken for this visit.  There is no height or weight on file to calculate BMI.  Advanced Directives 05/23/2018 02/02/2017 09/19/2015 06/26/2015 03/20/2015  Does Patient Have a Medical Advance Directive? Yes No No No No  Type of Paramedic of [place];Living will - - - -  Does patient want to make changes to medical advance directive? No - Patient declined - - - -  Copy of [place] in Chart? No - copy requested - - - -  Would patient like information on creating a medical advance directive? - - - No - patient declined information Yes - Educational materials given    Tobacco Social History   Tobacco Use  Smoking Status Former Smoker  . Packs/day: 0.50  . Years: 3.00  . Pack years: 1.50  . Last attempt to quit: 10/11/1968  . Years since quitting: 49.6  Smokeless Tobacco Never Used     Counseling given: Not Answered    Past Medical History:  Diagnosis Date  . Anemia   . Hypertension   . INGROWN NAIL 11/10/2010   Annotation: bilateral  Qualifier: Diagnosis of  By: Arnoldo Morale MD, Balinda Quails    Past Surgical History:  Procedure Laterality Date  . CIRCUMCISION     age 61   Family History  Problem Relation Age of Onset  . Hypertension Father   . Heart disease  Father   . Hearing loss Mother   . Heart disease Daughter   . Obesity Daughter   . Obesity Son   . Diabetes Maternal Grandmother   . Colon cancer Neg Hx    Social History   Socioeconomic History  . Marital status: Married    Spouse name: Ruby  . Number of children: Not on file  . Years of education: Not on file  . Highest education level: Some college, no degree  Occupational History    Employer: RETIRED  Social Needs  . Financial resource strain: Not on file  . Food insecurity:    Worry: Not on file    Inability: Not on file  . Transportation needs:    Medical: Not on file    Non-medical: Not on file  Tobacco Use  . Smoking status: Former Smoker    Packs/day: 0.50    Years: 3.00    Pack years: 1.50    Last attempt to quit: 10/11/1968    Years since quitting: 49.6  . Smokeless tobacco: Never Used  Substance and Sexual Activity  . Alcohol use: Yes    Alcohol/week: 5.0 standard drinks    Types: 1 Shots of liquor, 4 Standard drinks or equivalent per week    Comment: once a week   . Drug use: No  .  Sexual activity: Not on file  Lifestyle  . Physical activity:    Days per week: Not on file    Minutes per session: Not on file  . Stress: Not on file  Relationships  . Social connections:    Talks on phone: Not on file    Gets together: Not on file    Attends religious service: Not on file    Active member of club or organization: Not on file    Attends meetings of clubs or organizations: Not on file    Relationship status: Not on file  Other Topics Concern  . Not on file  Social History Narrative   Married (3rd marriage). 5 children (3 from 1st, 2 from second), 7 grandkids.       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 He lives with his wife in a 2 story house. He drinks one cup of coffee QOD, and occasionally drinks tea.    Outpatient Encounter Medications as of 05/23/2018    Medication Sig  . atorvastatin (LIPITOR) 10 MG tablet TAKE 1 TABLET BY MOUTH  DAILY  . losartan-hydrochlorothiazide (HYZAAR) 100-25 MG tablet Take 1 tablet by mouth daily. (Patient not taking: Reported on 05/23/2018)   No facility-administered encounter medications on file as of 05/23/2018.     Activities of Daily Living In your present state of health, do you have any difficulty performing the following activities: 05/23/2018  Hearing? N  Vision? N  Difficulty concentrating or making decisions? N  Walking or climbing stairs? N  Dressing or bathing? N  Doing errands, shopping? N  Preparing Food and eating ? N  Using the Toilet? N  In the past six months, have you accidently leaked urine? N  Do you have problems with loss of bowel control? N  Managing your Medications? N  Managing your Finances? N  Housekeeping or managing your Housekeeping? N  Some recent data might be hidden    Patient Care Team: Marin Olp, MD as PCP - General (Family Medicine) Buford Dresser, MD as PCP - Cardiology (Cardiology) Buford Dresser, MD as Consulting Physician (Cardiology)   Assessment:   This is a routine wellness examination for [place].  Exercise Activities and Dietary recommendations Current Exercise Habits: Structured exercise class([employer], [employer]), Type of exercise: Other - see comments([employer]), Time (Minutes): 45, Frequency (Times/Week): 6, Weekly Exercise (Minutes/Week): 270, Intensity: Mild   Breakfast: Pomegranate and cranberry juice. Doesn't normally eat anything  Lunch: Sandwich, drinks sweet tea  Dinner: Ate at [employer]. Cabbage, potatoes, and meatloaf, with sweet tea. Apple Cobbler  Goals    . patient     To fup on foot numbness; stops him from bowling; difficulty sliding     . Weight (lb) < 200 lb (90.7 kg)     Patient would at least like to lose 25lbs in the next year. We discussed losing 2-3lbs a month to make a lifestyle change versus a  quick weight loss and gaining weight back       Fall Risk Fall Risk  05/23/2018 04/21/2018 04/18/2018 02/02/2017 02/05/2016  Falls in the past year? No No No No No    Depression Screen PHQ 2/9 Scores 05/23/2018 04/21/2018 02/02/2017 02/05/2016  PHQ - 2 Score 0 0 0 0    Cognitive Function MMSE - Mini Mental State Exam 02/02/2017  Not completed: (No Data)  6CIT Screen 05/23/2018 02/02/2017  What Year? 0 points 0 points  What month? 0 points 0 points  What time? 0 points 0 points  Count back from 20 0 points 0 points  Months in reverse 0 points 0 points  Repeat phrase - 2 points  Total Score - 2    Immunization History  Administered Date(s) Administered  . Influenza Split 08/15/2012  . Influenza, High Dose Seasonal PF 07/14/2013, 07/05/2014  . Influenza-Unspecified 07/20/2015  . Pneumococcal Conjugate-13 07/05/2014  . Pneumococcal Polysaccharide-23 02/12/2013  . Td 10/11/2005  . Tdap 03/17/2018  . Zoster 02/04/2015      Screening Tests Health Maintenance  Topic Date Due  . INFLUENZA VACCINE  05/11/2018  . Hepatitis C Screening  10/08/2098 (Originally Oct 20, 1945)  . COLONOSCOPY  06/25/2020  . TETANUS/TDAP  03/17/2028  . PNA vac Low Risk Adult  Completed         Plan:    Follow Up with PCP as advised  I have personally reviewed and noted the following in the patient's chart:   . Medical and social history . Use of alcohol, tobacco or illicit drugs  . Current medications and supplements . Functional ability and status . Nutritional status . Physical activity . Advanced directives . List of other physicians . Vitals . Screenings to include cognitive, depression, and falls . Referrals and appointments  In addition, I have reviewed and discussed with patient certain preventive protocols, quality metrics, and best practice recommendations. A written personalized care plan for preventive services as well as general preventive health recommendations were provided to  patient.     [place], [place]  4/81/8590

## 2018-05-23 NOTE — Assessment & Plan Note (Signed)
S: controlled on  olmesartan-hctz 40-25mg  previously--> had to change to losartan hctz 100-25mg . He was off med at last visit - he was having some lightheadedness at that time and we worried about viral illness so we held the medication.   Even before he was ill-Patient states when he takes the losartan-hctz he would get upper chest pain when he would take the pill- would last half a day. Cutting down to half tablet didn't help- still had chest pain- described as knot in chest for still half day. He stopped taking the medicine before last visit.   He was seen for wellness visit today and BP had skyrocketed to 172/100. Patient was fasting- went home and got some food (was asymptomatic when left) and when came back BP had improved significantly BP Readings from Last 3 Encounters:  05/23/18 (!) 142/80  05/23/18 (!) 172/100- extremely elevated at [facility] today  04/21/18 138/80  A/P:  blood pressure goal of <140/90. Stop losartan-hczt (or should say continue off of this). BP not far off goal on repeat today. Will try telmisartan 40-hctz 12.5mg  combo pill with 2-4 week follow up but he will let me know if recurrent symptoms of the upper chest tightness on medicine (please note that it was never reported as exertional symptoms).

## 2018-05-23 NOTE — Patient Instructions (Signed)
Blood pressure came down on repeat this afternoon but is still high. Lets try something not losartan based since you were getting that knot feeling with that medicine. Try telmisartan-hctz 40-12.5mg  and see me in 2-4 weeks- schedule a visit before you leave.

## 2018-06-13 ENCOUNTER — Ambulatory Visit: Payer: Medicare Other | Admitting: Family Medicine

## 2018-06-14 ENCOUNTER — Other Ambulatory Visit: Payer: Self-pay

## 2018-06-14 MED ORDER — TELMISARTAN-HCTZ 40-12.5 MG PO TABS: 1.0000 | ORAL_TABLET | Freq: Every day | ORAL | 2 refills | Status: DC

## 2018-06-15 ENCOUNTER — Encounter: Payer: Self-pay | Admitting: Family Medicine

## 2018-06-15 ENCOUNTER — Ambulatory Visit (INDEPENDENT_AMBULATORY_CARE_PROVIDER_SITE_OTHER): Payer: Medicare Other | Admitting: Family Medicine

## 2018-06-15 DIAGNOSIS — I10 Essential (primary) hypertension: Secondary | ICD-10-CM | POA: Diagnosis not present

## 2018-06-15 NOTE — Patient Instructions (Addendum)
Health Maintenance Due  Topic Date Due  . INFLUENZA VACCINE -Let us know when you receive this from the [facility] 05/11/2018   Blood pressure looks great . Glad side effects from other medicine did not happen with this one.   See you in november

## 2018-06-15 NOTE — Addendum Note (Signed)
Addended by: Marin Olp on: 06/15/2018 10:18 AM   Modules accepted: Level of Service

## 2018-06-15 NOTE — Progress Notes (Signed)
Subjective:  Caleb Horton is a 72 y.o. year old very pleasant male patient who presents for/with See problem oriented charting ROS- No chest pain or shortness of breath. No headache or blurry vision.    Past Medical History-  Patient Active Problem List   Diagnosis Date Noted  . Prostate cancer ([place]) 08/31/2017    Priority: High  . AAA (abdominal aortic aneurysm) ([place]) 03/17/2017    Priority: Medium  . Numbness in feet 02/07/2017    Priority: Medium  . Hyperlipidemia 09/04/2014    Priority: Medium  . CKD (chronic kidney disease), stage III ([place]) 09/04/2014    Priority: Medium  . Essential hypertension 11/27/2007    Priority: Medium  . History of adenomatous polyp of colon 08/05/2015    Priority: Low  . Hyperglycemia 11/27/2007    Priority: Low  . Sickle-cell trait ([place]) 11/27/2007    Priority: Low  . BPH (benign prostatic hyperplasia) 11/27/2007    Priority: Low  . Thrombocytopenia ([place]) 09/19/2015  . Gammopathy 03/20/2015  . Elevated blood protein 02/04/2015    Medications- reviewed and updated Current Outpatient Medications  Medication Sig Dispense Refill  . atorvastatin (LIPITOR) 10 MG tablet TAKE 1 TABLET BY MOUTH  DAILY 90 tablet 3  . telmisartan-hydrochlorothiazide (MICARDIS HCT) 40-12.5 MG tablet Take 1 tablet by mouth daily. 90 tablet 2   No current facility-administered medications for this visit.     Objective: BP 128/78 (BP Location: Left Arm, Patient Position: Sitting, Cuff Size: Large)   Pulse 72   Temp 97.9 F (36.6 C) (Oral)   Ht 6' (1.829 m)   Wt 241 lb (109.3 kg)   SpO2 97%   BMI 32.69 kg/m  Gen: NAD, resting comfortably CV: RRR no murmurs rubs or gallops Lungs: CTAB no crackles, wheeze, rhonchi Ext: no edema Skin: warm, dry  Assessment/Plan:  Essential hypertension S: controlled on telmisartan 40-hctz 12.5mg  combo pill. Had chest tightness on losartan-hctz prior.  BP Readings from Last 3 Encounters:  06/15/18 128/78  05/23/18 (!)  142/80. Off BP meds  05/23/18 (!) 172/100. Off bp meds   A/P: We discussed blood pressure goal of <140/90. Continue current meds- much improved and no SE   Future Appointments  Date Time Provider [place]  08/23/2018 11:15 AM Marin Olp, MD [facility]  02/28/2019  9:45 AM Yong Channel Brayton Mars, MD [facility]   return precautions advised.  Garret Reddish, MD

## 2018-06-15 NOTE — Assessment & Plan Note (Signed)
S: controlled on telmisartan 40-hctz 12.5mg  combo pill. Had chest tightness on losartan-hctz prior.  BP Readings from Last 3 Encounters:  06/15/18 128/78  05/23/18 (!) 142/80. Off BP meds  05/23/18 (!) 172/100. Off bp meds   A/P: We discussed blood pressure goal of <140/90. Continue current meds- much improved and no SE

## 2018-08-01 ENCOUNTER — Other Ambulatory Visit: Payer: Self-pay | Admitting: Family Medicine

## 2018-08-23 ENCOUNTER — Ambulatory Visit (INDEPENDENT_AMBULATORY_CARE_PROVIDER_SITE_OTHER): Payer: Medicare Other | Admitting: Family Medicine

## 2018-08-23 ENCOUNTER — Encounter: Payer: Self-pay | Admitting: Family Medicine

## 2018-08-23 VITALS — BP 128/70 | HR 89 | Temp 98.3°F | Ht 72.0 in | Wt 251.6 lb

## 2018-08-23 DIAGNOSIS — I10 Essential (primary) hypertension: Secondary | ICD-10-CM | POA: Diagnosis not present

## 2018-08-23 DIAGNOSIS — E785 Hyperlipidemia, unspecified: Secondary | ICD-10-CM

## 2018-08-23 NOTE — Assessment & Plan Note (Signed)
S: likely poorly controlled on no rx. He was getting some numbness in his feet and was worried the medicine was causing this and has never restarted.  Lab Results  Component Value Date   CHOL 205 (H) 02/22/2018   HDL 36.60 (L) 02/22/2018   LDLCALC 140 (H) 02/22/2018   LDLDIRECT 82.0 08/05/2015   TRIG 141.0 02/22/2018   CHOLHDL 6 02/22/2018   A/P: Restart atorvastatin 10 mg- repeat direct LDL in 6 weeks with hopes of it being under 100.  Also get CBC and CMP at that time

## 2018-08-23 NOTE — Progress Notes (Signed)
Subjective:  Caleb Horton is a 72 y.o. year old very pleasant male patient who presents for/with See problem oriented charting ROS- No chest pain or shortness of breath. No headache or blurry vision.    Past Medical History-  Patient Active Problem List   Diagnosis Date Noted  . Prostate cancer ([place]) 08/31/2017    Priority: High  . AAA (abdominal aortic aneurysm) ([place]) 03/17/2017    Priority: Medium  . Numbness in feet 02/07/2017    Priority: Medium  . Hyperlipidemia 09/04/2014    Priority: Medium  . CKD (chronic kidney disease), stage III ([place]) 09/04/2014    Priority: Medium  . Essential hypertension 11/27/2007    Priority: Medium  . History of adenomatous polyp of colon 08/05/2015    Priority: Low  . Hyperglycemia 11/27/2007    Priority: Low  . Sickle-cell trait ([place]) 11/27/2007    Priority: Low  . BPH (benign prostatic hyperplasia) 11/27/2007    Priority: Low  . Thrombocytopenia ([place]) 09/19/2015  . Gammopathy 03/20/2015  . Elevated blood protein 02/04/2015    Medications- reviewed and updated Current Outpatient Medications  Medication Sig Dispense Refill  . atorvastatin (LIPITOR) 10 MG tablet TAKE 1 TABLET BY MOUTH  DAILY 90 tablet 3  . telmisartan-hydrochlorothiazide (MICARDIS HCT) 40-12.5 MG tablet Take 1 tablet by mouth daily. 90 tablet 2   No current facility-administered medications for this visit.     Objective: BP 128/70 (BP Location: Left Arm, Patient Position: Sitting, Cuff Size: Large)   Pulse 89   Temp 98.3 F (36.8 C) (Oral)   Ht 6' (1.829 m)   Wt 251 lb 9.6 oz (114.1 kg)   SpO2 95%   BMI 34.12 kg/m  Gen: NAD, resting comfortably CV: RRR no murmurs rubs or gallops Lungs: CTAB no crackles, wheeze, rhonchi Abdomen: soft/nontender/obese Ext: no edema Skin: warm, dry Neuro: speech normal, moves all extremities  Assessment/Plan:  Other notes: 1. sees urology in December for prostate cancer under active surveillance  Essential  hypertension S: controlled on telmisartan hctz 40-12.5mg  BP Readings from Last 3 Encounters:  08/23/18 128/70  06/15/18 128/78  05/23/18 (!) 142/80  A/P: We discussed blood pressure goal of <140/90. Continue current meds  Hyperlipidemia S: likely poorly controlled on no rx. He was getting some numbness in his feet and was worried the medicine was causing this and has never restarted.  Lab Results  Component Value Date   CHOL 205 (H) 02/22/2018   HDL 36.60 (L) 02/22/2018   LDLCALC 140 (H) 02/22/2018   LDLDIRECT 82.0 08/05/2015   TRIG 141.0 02/22/2018   CHOLHDL 6 02/22/2018   A/P: Restart atorvastatin 10 mg- repeat direct LDL in 6 weeks with hopes of it being under 100.  Also get CBC and CMP at that time  Future Appointments  Date Time Provider [place]  10/18/2018 needs to be changes to lab visit 11:00 AM Marin Olp, MD [facility]  02/28/2019  9:40 AM Marin Olp, MD [facility]   Lab/Order associations: Hyperlipidemia, unspecified hyperlipidemia type - Plan: CBC, Comprehensive metabolic panel, LDL cholesterol, direct  Essential hypertension  Return precautions advised.  Garret Reddish, MD

## 2018-08-23 NOTE — Patient Instructions (Addendum)
Blood pressure looks great- no changes today  Cholesterol has been high- I understand why you topped the medication but seeing your numbers shows me we need to restart this. Then come back in about 6-8 weeks for blood work- schedule a lab visit in 6-8 weeks before you leave today  Outside of this bloodwork- see you in 6 months at your already scheduled appointment unless you need [place] sooner

## 2018-08-23 NOTE — Assessment & Plan Note (Signed)
S: controlled on telmisartan hctz 40-12.5mg  BP Readings from Last 3 Encounters:  08/23/18 128/70  06/15/18 128/78  05/23/18 (!) 142/80  A/P: We discussed blood pressure goal of <140/90. Continue current meds

## 2018-09-14 ENCOUNTER — Telehealth: Payer: Self-pay | Admitting: Family Medicine

## 2018-09-14 DIAGNOSIS — I10 Essential (primary) hypertension: Secondary | ICD-10-CM

## 2018-09-14 NOTE — Telephone Encounter (Signed)
Patient states he was advised to contact office regarding telmisartan-hydrochlorothiazide (MICARDIS HCT) 40-12.5 MG tablet; out of stock until further notice. Patient would like to know if there is an alternative medication that could be sent to pharmacy. Please advise.   [place], [place] [phone] (Phone) [phone] (Fax)

## 2018-09-14 NOTE — Telephone Encounter (Signed)
Copied from [place] [id number]. Topic: Quick Communication - See Telephone Encounter >> Sep 14, 2018  4:52 PM Sheran Luz wrote: CRM for notification. See Telephone encounter for: 09/14/18.  Patient states he was advised to contact office regarding telmisartan-hydrochlorothiazide (MICARDIS HCT) 40-12.5 MG tablet being out of stock until further notice. Patient would like to know if there is an alternative medication that he is able to take that could be sent to pharmacy. Please advise.   [place], [place] [phone] (Phone) [phone] (Fax)

## 2018-09-14 NOTE — Telephone Encounter (Signed)
See note

## 2018-09-15 NOTE — Telephone Encounter (Signed)
Dr. Hunter, please advise.  

## 2018-09-15 NOTE — Telephone Encounter (Signed)
What are available options in this class of medicien that are not on recall or backorder?

## 2018-09-19 NOTE — Telephone Encounter (Signed)
Per [facility] - please be advise the following are NOT available and currently out of stock by mfg- Amlod/valsartan5-320, 10-320 Irebesartan 75, 150, 300, 150-12.5, 300-12.5 Losartan-HCTZ 50-12.5, 100-12.5, 100-25,  Olmesartan-HCTZ 20-12.5, 40-12.5, 40-25 Telm/amlod 40-5, 80-10, 80-5 telm-HCTZ 40-12.5, 80-12.5, 80-25 telmisartan 20, 40, 80 Valsartan 40, 160, 320, 320-25  Please supply a new prescription to a local pharmacy, recommend an alternative, or if you wish to transfer the prescription out to a local pharmacy please have the pharmacy contact [phone] and can transfer it our. Please advise your pt the copay cost may vary at local pharmacy.

## 2018-09-20 DIAGNOSIS — R350 Frequency of micturition: Secondary | ICD-10-CM | POA: Diagnosis not present

## 2018-09-20 NOTE — Telephone Encounter (Signed)
Brandy/Jamie- I need to know which ARB options are available- not the ones that are not- can you reach back out to their pharmacy team to see what they have?  For example- I dont see losartan alone listed- do they have losartan 50 and 100mg ? Then I could send in 100mg  losartan and 12.5mg  hydrochlorothiazide separetely. #90 of both with 3 refills and would need BP recheck in 1-2 months.

## 2018-09-21 NOTE — Telephone Encounter (Signed)
Patient is calling to request a update on medication. Please advise

## 2018-09-25 NOTE — Telephone Encounter (Addendum)
Pt called to follow up on telmisartan-hydrochlorothiazide (MICARDIS HCT) 40-12.5 MG tablet replacement.  Also would like to know if partial rx can be sent to  [place], [address] AT [address] [phone] (Phone) [phone] (Fax)  Until new rx is sent to [facility]  because he is completely out of medication as of today. Please contact pt with update.

## 2018-09-25 NOTE — Telephone Encounter (Signed)
[facility] called and spoke to [place], [facility] about the request of Dr. Yong Channel below. He says they have in stock Losartan 50 mg and 100 mg; Valsartan 80 mg and 160 mg; HCTZ 12.5 mg is in stock as well. He says they will need 2 Rx sent with what the provider wants for the patient.   The patient called and is completely out of the medication and will need a short supply of whatever medication Dr. Yong Channel decides sent to a local pharmacy, until the mail order arrives.  Local Pharmacy for short supply: [facility] STORE [place], [address] AT [address] [phone] (Phone) [phone] (Fax)   Mail order for long-term supply: [place], [place] [phone] (Phone) [phone] (Fax)

## 2018-09-25 NOTE — Telephone Encounter (Signed)
See note

## 2018-09-25 NOTE — Telephone Encounter (Signed)
Patient is calling to check on the status of his medication. Please advise. Thank you.

## 2018-09-26 MED ORDER — HYDROCHLOROTHIAZIDE 12.5 MG PO TABS: 12.5000 mg | ORAL_TABLET | Freq: Every day | ORAL | 0 refills | Status: DC

## 2018-09-26 MED ORDER — LOSARTAN POTASSIUM 100 MG PO TABS: 100.0000 mg | ORAL_TABLET | Freq: Every day | ORAL | 3 refills | Status: DC

## 2018-09-26 MED ORDER — VALSARTAN 80 MG PO TABS: 160.0000 mg | ORAL_TABLET | Freq: Every day | ORAL | 3 refills | Status: DC

## 2018-09-26 MED ORDER — LOSARTAN POTASSIUM 100 MG PO TABS: 100.0000 mg | ORAL_TABLET | Freq: Every day | ORAL | 0 refills | Status: DC

## 2018-09-26 MED ORDER — HYDROCHLOROTHIAZIDE 12.5 MG PO TABS: 12.5000 mg | ORAL_TABLET | Freq: Every day | ORAL | 3 refills | Status: DC

## 2018-09-26 MED ORDER — VALSARTAN 80 MG PO TABS: 160.0000 mg | ORAL_TABLET | Freq: Every day | ORAL | 0 refills | Status: DC

## 2018-09-26 NOTE — Assessment & Plan Note (Signed)
Originally sent and losartan 100 mg and hydrochlorothiazide 12.5 mg- reviewed prior history of blood pressure medications after sending in and noted patient had a knot sensation in his upper chest on losartan so we will stop this and send in valsartan 160 mg with 1 month recheck.He should still take hydrochlorothiazide 12.5 mg

## 2018-09-26 NOTE — Telephone Encounter (Signed)
Dr. Yong Channel, please see below regarding medication change.

## 2018-09-26 NOTE — Telephone Encounter (Addendum)
Caleb Horton- I just want to thank you for doing an excellent job on providing thorough information to help me send in available prescriptions  Team-please inform patient that I sent in 30-day supply to local pharmacy and 1 year supply of both medications to his mail order  Originally sent and losartan 100 mg and hydrochlorothiazide 12.5 mg- reviewed prior history of blood pressure medications after sending in and noted patient had a knot sensation in his upper chest on losartan so we will stop this and send in valsartan 160 mg with 1 month recheck-please help schedule this visit for him.  He should still take hydrochlorothiazide 12.5 mg

## 2018-09-26 NOTE — Addendum Note (Signed)
Addended by: Marin Olp on: 09/26/2018 10:15 PM   Modules accepted: Orders

## 2018-09-27 NOTE — Telephone Encounter (Signed)
Called [facility] and [facility] to cancel rx for Losartan. Called pt and advised, f/u OV scheduled for 10/25/17 11:00 AM.

## 2018-10-09 ENCOUNTER — Telehealth: Payer: Self-pay | Admitting: Family Medicine

## 2018-10-09 NOTE — Telephone Encounter (Signed)
Copied from [place] [phone]. Topic: General - Other >> Oct 09, 2018  4:55 PM Antonieta Iba C wrote: Reason for CRM: pt says that he have received his Rx for valsartan (DIOVAN) 80 MG tablet however he didn't receive a full Rx from his pharmacy. Pt says that the bottle the pharmacy provider didn't didn't have refills, system showing that pt should have refills. Pt says that he is almost out of his medication and would like further assistance.

## 2018-10-10 ENCOUNTER — Other Ambulatory Visit: Payer: Self-pay

## 2018-10-10 MED ORDER — VALSARTAN 80 MG PO TABS: 160.0000 mg | ORAL_TABLET | Freq: Every day | ORAL | 3 refills | Status: DC

## 2018-10-10 NOTE — Telephone Encounter (Signed)
Pt. Will call his [facility] for a refill.

## 2018-10-18 ENCOUNTER — Other Ambulatory Visit (INDEPENDENT_AMBULATORY_CARE_PROVIDER_SITE_OTHER): Payer: Medicare Other

## 2018-10-18 DIAGNOSIS — E785 Hyperlipidemia, unspecified: Secondary | ICD-10-CM | POA: Diagnosis not present

## 2018-10-18 LAB — CBC
HEMATOCRIT: 37.6 % — AB (ref 39.0–52.0)
Hemoglobin: 12.6 g/dL — ABNORMAL LOW (ref 13.0–17.0)
MCHC: 33.5 g/dL (ref 30.0–36.0)
MCV: 88.9 fl (ref 78.0–100.0)
Platelets: 154 10*3/uL (ref 150.0–400.0)
RBC: 4.23 Mil/uL (ref 4.22–5.81)
RDW: 14.6 % (ref 11.5–15.5)
WBC: 6.4 10*3/uL (ref 4.0–10.5)

## 2018-10-18 LAB — COMPREHENSIVE METABOLIC PANEL
ALT: 27 U/L (ref 0–53)
AST: 22 U/L (ref 0–37)
Albumin: 3.8 g/dL (ref 3.5–5.2)
Alkaline Phosphatase: 67 U/L (ref 39–117)
BILIRUBIN TOTAL: 0.4 mg/dL (ref 0.2–1.2)
BUN: 24 mg/dL — AB (ref 6–23)
CHLORIDE: 104 meq/L (ref 96–112)
CO2: 28 mEq/L (ref 19–32)
Calcium: 10.4 mg/dL (ref 8.4–10.5)
Creatinine, Ser: 1.26 mg/dL (ref 0.40–1.50)
GFR: 72.49 mL/min (ref >60.00)
GLUCOSE: 104 mg/dL — AB (ref 70–99)
Potassium: 3.6 mEq/L (ref 3.5–5.1)
SODIUM: 136 meq/L (ref 135–145)
Total Protein: 8.6 g/dL — ABNORMAL HIGH (ref 6.0–8.3)

## 2018-10-18 LAB — LDL CHOLESTEROL, DIRECT: LDL DIRECT: 76 mg/dL

## 2018-10-25 ENCOUNTER — Encounter: Payer: Self-pay | Admitting: Family Medicine

## 2018-10-25 ENCOUNTER — Ambulatory Visit (INDEPENDENT_AMBULATORY_CARE_PROVIDER_SITE_OTHER): Payer: Medicare Other | Admitting: Family Medicine

## 2018-10-25 VITALS — BP 128/68 | HR 94 | Temp 98.4°F | Ht 72.0 in | Wt 250.0 lb

## 2018-10-25 DIAGNOSIS — E785 Hyperlipidemia, unspecified: Secondary | ICD-10-CM

## 2018-10-25 DIAGNOSIS — I10 Essential (primary) hypertension: Secondary | ICD-10-CM

## 2018-10-25 NOTE — Assessment & Plan Note (Signed)
S: Well controlled on atorvastatin 10 mg with last LDL at 76 just 7 days ago  A/P: Stable-continue current medications

## 2018-10-25 NOTE — Patient Instructions (Addendum)
Blood pressure and cholesterol both in good shape! Keep up the good work

## 2018-10-25 NOTE — Assessment & Plan Note (Signed)
S: controlled on valsartan 160 mg-have had to switch multiple times due to recalls and availability-and hydrochlorothiazide 12.5 mg BP Readings from Last 3 Encounters:  10/25/18 128/68  08/23/18 128/70  06/15/18 128/78  A/P:  blood pressure goal of <140/90. Continue current meds: Patient doing well with new medication without side effects

## 2018-10-25 NOTE — Progress Notes (Signed)
Subjective:  Caleb Horton is a 73 y.o. year old very pleasant male patient who presents for/with See problem oriented charting ROS-  No chest pain or shortness of breath. No headache or blurry vision.     Past Medical History-  Patient Active Problem List   Diagnosis Date Noted  . Prostate cancer ([place]) 08/31/2017    Priority: High  . AAA (abdominal aortic aneurysm) ([place]) 03/17/2017    Priority: Medium  . Numbness in feet 02/07/2017    Priority: Medium  . Hyperlipidemia 09/04/2014    Priority: Medium  . CKD (chronic kidney disease), stage III ([place]) 09/04/2014    Priority: Medium  . Essential hypertension 11/27/2007    Priority: Medium  . History of adenomatous polyp of colon 08/05/2015    Priority: Low  . Hyperglycemia 11/27/2007    Priority: Low  . Sickle-cell trait ([place]) 11/27/2007    Priority: Low  . BPH (benign prostatic hyperplasia) 11/27/2007    Priority: Low  . Thrombocytopenia ([place]) 09/19/2015  . Gammopathy 03/20/2015  . Elevated blood protein 02/04/2015    Medications- reviewed and updated Current Outpatient Medications  Medication Sig Dispense Refill  . atorvastatin (LIPITOR) 10 MG tablet TAKE 1 TABLET BY MOUTH  DAILY 90 tablet 3  . hydrochlorothiazide (HYDRODIURIL) 12.5 MG tablet Take 1 tablet (12.5 mg total) by mouth daily. 90 tablet 3  . valsartan (DIOVAN) 80 MG tablet Take 2 tablets (160 mg total) by mouth daily. 90 tablet 3   No current facility-administered medications for this visit.     Objective: BP 128/68 (BP Location: Left Arm, Patient Position: Sitting, Cuff Size: Large)   Pulse 94   Temp 98.4 F (36.9 C) (Oral)   Ht 6' (1.829 m)   Wt 250 lb (113.4 kg)   SpO2 96%   BMI 33.91 kg/m  Gen: NAD, resting comfortably  CV: RRR  Lungs: nonlabored, normal respiratory rate Abdomen: soft/nondistended  Ext: no edema Skin: warm, dry  Assessment/Plan:   Essential hypertension S: controlled on valsartan 160 mg-have had to switch multiple times  due to recalls and availability-and hydrochlorothiazide 12.5 mg BP Readings from Last 3 Encounters:  10/25/18 128/68  08/23/18 128/70  06/15/18 128/78  A/P:  blood pressure goal of <140/90. Continue current meds: Patient doing well with new medication without side effects  Hyperlipidemia S: Well controlled on atorvastatin 10 mg with last LDL at 76 just 7 days ago  A/P: Stable-continue current medications   Future Appointments  Date Time Provider [place]  02/28/2019  9:40 AM Marin Olp, MD [facility]   Return in about 6 months (around 04/25/2019) for physical.  Return precautions advised.  Garret Reddish, MD

## 2019-01-19 ENCOUNTER — Other Ambulatory Visit: Payer: Self-pay | Admitting: Family Medicine

## 2019-02-21 ENCOUNTER — Encounter: Payer: Medicare Other | Admitting: Family Medicine

## 2019-02-28 ENCOUNTER — Encounter: Payer: Self-pay | Admitting: Family Medicine

## 2019-02-28 ENCOUNTER — Other Ambulatory Visit: Payer: Self-pay

## 2019-02-28 ENCOUNTER — Ambulatory Visit (INDEPENDENT_AMBULATORY_CARE_PROVIDER_SITE_OTHER): Payer: Medicare Other | Admitting: Family Medicine

## 2019-02-28 VITALS — BP 136/82 | HR 80 | Temp 98.5°F | Ht 72.0 in | Wt 259.2 lb

## 2019-02-28 DIAGNOSIS — I714 Abdominal aortic aneurysm, without rupture, unspecified: Secondary | ICD-10-CM

## 2019-02-28 DIAGNOSIS — N183 Chronic kidney disease, stage 3 unspecified: Secondary | ICD-10-CM

## 2019-02-28 DIAGNOSIS — E785 Hyperlipidemia, unspecified: Secondary | ICD-10-CM | POA: Diagnosis not present

## 2019-02-28 DIAGNOSIS — G609 Hereditary and idiopathic neuropathy, unspecified: Secondary | ICD-10-CM

## 2019-02-28 DIAGNOSIS — Z0000 Encounter for general adult medical examination without abnormal findings: Secondary | ICD-10-CM

## 2019-02-28 DIAGNOSIS — I10 Essential (primary) hypertension: Secondary | ICD-10-CM

## 2019-02-28 DIAGNOSIS — C61 Malignant neoplasm of prostate: Secondary | ICD-10-CM

## 2019-02-28 DIAGNOSIS — D573 Sickle-cell trait: Secondary | ICD-10-CM

## 2019-02-28 DIAGNOSIS — D696 Thrombocytopenia, unspecified: Secondary | ICD-10-CM

## 2019-02-28 DIAGNOSIS — Z6835 Body mass index (BMI) 35.0-35.9, adult: Secondary | ICD-10-CM

## 2019-02-28 LAB — CBC
HCT: 40.2 % (ref 39.0–52.0)
Hemoglobin: 13.6 g/dL (ref 13.0–17.0)
MCHC: 33.8 g/dL (ref 30.0–36.0)
MCV: 86.6 fl (ref 78.0–100.0)
Platelets: 126 10*3/uL — ABNORMAL LOW (ref 150.0–400.0)
RBC: 4.64 Mil/uL (ref 4.22–5.81)
RDW: 14.4 % (ref 11.5–15.5)
WBC: 6.4 10*3/uL (ref 4.0–10.5)

## 2019-02-28 LAB — COMPREHENSIVE METABOLIC PANEL
ALT: 16 U/L (ref 0–53)
AST: 19 U/L (ref 0–37)
Albumin: 3.9 g/dL (ref 3.5–5.2)
Alkaline Phosphatase: 74 U/L (ref 39–117)
BUN: 15 mg/dL (ref 6–23)
CO2: 27 mEq/L (ref 19–32)
Calcium: 10.1 mg/dL (ref 8.4–10.5)
Chloride: 105 mEq/L (ref 96–112)
Creatinine, Ser: 1.2 mg/dL (ref 0.40–1.50)
GFR: 72.08 mL/min (ref >60.00)
Glucose, Bld: 115 mg/dL — ABNORMAL HIGH (ref 70–99)
Potassium: 3.8 mEq/L (ref 3.5–5.1)
Sodium: 137 mEq/L (ref 135–145)
Total Bilirubin: 0.5 mg/dL (ref 0.2–1.2)
Total Protein: 8.9 g/dL — ABNORMAL HIGH (ref 6.0–8.3)

## 2019-02-28 LAB — LIPID PANEL
Cholesterol: 129 mg/dL (ref 0–200)
HDL: 43.8 mg/dL (ref >39.00)
LDL Cholesterol: 71 mg/dL (ref 0–99)
NonHDL: 85.67
Total CHOL/HDL Ratio: 3
Triglycerides: 73 mg/dL (ref 0.0–149.0)
VLDL: 14.6 mg/dL (ref 0.0–40.0)

## 2019-02-28 MED ORDER — VALSARTAN 80 MG PO TABS: 160.0000 mg | ORAL_TABLET | Freq: Every day | ORAL | 3 refills | Status: DC

## 2019-02-28 NOTE — Assessment & Plan Note (Signed)
CKD III- should hold valsartan and potentially hctz if becomes ill and cant tolerate fluids. Knows to avoid nsaids. Update cmp today

## 2019-02-28 NOTE — Assessment & Plan Note (Signed)
controlled on  olmesartan hctz 40-25 mg previously (mutliple changes due to recall and availability)- right now on valsartan 160mg  and hctz 12.5 mg and controlling BP. Had knot in chest sensation with losartan that resolved off of it.

## 2019-02-28 NOTE — Progress Notes (Signed)
Phone: [phone]   Subjective:  Patient presents today for their annual physical. Chief complaint-noted.   See problem oriented charting- ROS- full  review of systems was completed and negative except for: tingling in toes  The following were reviewed and entered/updated in epic: Past Medical History:  Diagnosis Date  . Anemia   . Hypertension   . INGROWN NAIL 11/10/2010   Annotation: bilateral  Qualifier: Diagnosis of  By: Arnoldo Morale MD, Balinda Quails    Patient Active Problem List   Diagnosis Date Noted  . Prostate cancer ([place]) 08/31/2017    Priority: High  . AAA (abdominal aortic aneurysm) ([place]) 03/17/2017    Priority: Medium  . Idiopathic neuropathy 02/07/2017    Priority: Medium  . Hyperlipidemia 09/04/2014    Priority: Medium  . CKD (chronic kidney disease), stage III ([place]) 09/04/2014    Priority: Medium  . Essential hypertension 11/27/2007    Priority: Medium  . History of adenomatous polyp of colon 08/05/2015    Priority: Low  . Hyperglycemia 11/27/2007    Priority: Low  . Sickle-cell trait ([place]) 11/27/2007    Priority: Low  . BPH (benign prostatic hyperplasia) 11/27/2007    Priority: Low  . Thrombocytopenia ([place]) 09/19/2015  . Gammopathy 03/20/2015  . Elevated blood protein 02/04/2015   Past Surgical History:  Procedure Laterality Date  . CIRCUMCISION     age 40    Family History  Problem Relation Age of Onset  . Hypertension Father   . Heart disease Father   . Hearing loss Mother   . Heart disease Daughter   . Obesity Daughter   . Obesity Son   . Diabetes Maternal Grandmother   . Colon cancer Neg Hx     Medications- reviewed and updated Current Outpatient Medications  Medication Sig Dispense Refill  . atorvastatin (LIPITOR) 10 MG tablet TAKE 1 TABLET BY MOUTH  DAILY 90 tablet 3  . hydrochlorothiazide (HYDRODIURIL) 12.5 MG tablet Take 1 tablet (12.5 mg total) by mouth daily. 90 tablet 3  . valsartan (DIOVAN) 80 MG tablet Take 2 tablets (160 mg  total) by mouth daily. 180 tablet 3   No current facility-administered medications for this visit.     Allergies-reviewed and updated Allergies  Allergen Reactions  . Losartan     Chest tightness- does ok on other arbs    Social History   Social History Narrative   Married (3rd marriage). 5 children (3 from 1st, 2 from second), 7 grandkids.       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 He lives with his wife in a 2 story house. He drinks one cup of coffee QOD, and occasionally drinks tea.   Objective  Objective:  BP 136/82 (BP Location: Left Arm, Patient Position: Sitting, Cuff Size: Large)   Pulse 80   Temp 98.5 F (36.9 C) (Oral)   Ht 6' (1.829 m)   Wt 259 lb 3.2 oz (117.6 kg)   SpO2 97%   BMI 35.15 kg/m  Gen: NAD, resting comfortably- wearing n95 mask outside of oropharynx exam HEENT: Mucous membranes are moist. Oropharynx normal Neck: no thyromegaly, no cervical lymphadenopathy CV: RRR no murmurs rubs or gallops Lungs: CTAB no crackles, wheeze, rhonchi Abdomen: soft/nontender/nondistended/normal bowel sounds. No rebound or guarding.  Ext: no edema Skin: warm, dry Neuro: grossly normal, moves all extremities, PERRLA    Assessment and Plan  73 y.o. male  presenting for annual physical.  Health Maintenance counseling: 1. Anticipatory guidance: Patient counseled regarding regular dental exams -q6 months, eye exams -yearly,  avoiding smoking and second hand smoke , limiting alcohol to 2 beverages per day .   2. Risk factor reduction:  Advised patient of need for regular exercise and diet rich and fruits and vegetables to reduce risk of heart attack and stroke. Exercise- discussed online options for exercise (has always done group exercises). Diet- Weight up about 9 lbs- eating more and not exercising with things being closed down. Struggling bc wife bringing things into the house.  Wt  Readings from Last 3 Encounters:  02/28/19 259 lb 3.2 oz (117.6 kg)  10/25/18 250 lb (113.4 kg)  08/23/18 251 lb 9.6 oz (114.1 kg)  3. Immunizations/screenings/ancillary studies- discussed shingrix- wants to defer until after covid 19  Immunization History  Administered Date(s) Administered  . Influenza Split 08/15/2012  . Influenza, High Dose Seasonal PF 07/14/2013, 07/05/2014  . Influenza-Unspecified 07/20/2015, 07/26/2018  . Pneumococcal Conjugate-13 07/05/2014  . Pneumococcal Polysaccharide-23 02/12/2013  . Td 10/11/2005  . Tdap 03/17/2018  . Zoster 02/04/2015  4. Prostate cancer screening-   Known prostate cancer- remains under active surveillance with [facility]- q6 month checks. Declined PSA today - will get this at urology Lab Results  Component Value Date   PSA 5.24 (H) 02/24/2018   PSA 4.97 (H) 02/02/2017   PSA 3.88 01/29/2016   5. Colon cancer screening -  06/26/15 with 5 year follow up 6. Skin cancer screening- lower risk as african [place]. advised regular sunscreen use. Denies worrisome, changing, or new skin lesions.  7. Former remote smoker- 3 pack years quit 1970. AAA scan last year- "Abdominal Aorta: No evidence of an abdominal aortic aneurysm was visualized. The largest aortic measurement is 2.4 cm at the proximal segment. Unable to duplicate proximal AAA measurements of 3.0 cm AP x 2.8 cm TRV from prior exam on 03/16/2017." no further scan needed per radiology  Status of chronic or acute concerns   HLD- remains on atorvastatin 10mg - update lipids today  hypertension-controlled on  olmesartan hctz 40-25 mg previously (mutliple changes due to recall and availability)- right now on valsartan 160mg  and hctz 12.5 mg and controlling BP  Neuropathy- referred to neurology - idiopathic neuropathy- he does not want to use medicine at this time  CKD III- should hold valsartan and potentially hctz if becomes ill and cant tolerate fluids. Knows to avoid nsaids. Update  cmp today  Hyperglycemia- a1c high 2015 but not last year- opted to monitor cbg alone Lab Results  Component Value Date   HGBA1C 5.5 02/22/2018   Mild anemia- monitor cbc- up to date on colonoscopy  Sickle cell trait noted- no high elevation travel or intense sport activities  Thrombocytopenia- not noted on last labs- previously noted as gammopathy by hematology. Continue to monitor cbc  Technically morbidly obese with htn, hld and BMI over 35-Encouraged need for healthy eating, regular exercise, weight loss.   6 month follow up- mainly checking in on weight Lab/Order associations:fasting  Preventative health care - Plan: CBC, Comprehensive metabolic panel, Lipid panel  CKD (chronic kidney disease), stage III (HCC)  Prostate cancer (HCC)  Abdominal aortic aneurysm (AAA) without rupture (HCC)  Sickle-cell trait (HCC)  Thrombocytopenia (HCC)  Morbid obesity (HCC)  Severe obesity (BMI 35.0-35.9 with comorbidity) (HCC)  Idiopathic neuropathy  Essential hypertension  Hyperlipidemia, unspecified hyperlipidemia type - Plan: CBC, Comprehensive metabolic panel, Lipid panel  Meds ordered this encounter  Medications  . valsartan (DIOVAN) 80 MG tablet    Sig: Take 2 tablets (160 mg total) by mouth daily.    Dispense:  180 tablet    Refill:  3   Return precautions advised.  Garret Reddish, MD

## 2019-02-28 NOTE — Patient Instructions (Addendum)
Please stop by lab before you go If you do not have mychart- we will call you about results within 5 business days of [place] receiving them.  If you have mychart- we will send your results within 3 business days of [place] receiving them.  If abnormal or we want to clarify a result, we will call or mychart you to make sure you receive the message.  If you have questions or concerns or don't hear within 5-7 days, please send [place] a message or call us.    Lets try to lose 5-10 lbs by follow up in 5-6 months- can also give flu shot at that time  No changes in medicines- as long as labs look good

## 2019-03-29 DIAGNOSIS — R350 Frequency of micturition: Secondary | ICD-10-CM | POA: Diagnosis not present

## 2019-06-08 ENCOUNTER — Ambulatory Visit: Payer: Medicare Other

## 2019-06-14 ENCOUNTER — Other Ambulatory Visit: Payer: Self-pay

## 2019-06-14 ENCOUNTER — Encounter: Payer: Self-pay | Admitting: Family Medicine

## 2019-06-14 ENCOUNTER — Ambulatory Visit (INDEPENDENT_AMBULATORY_CARE_PROVIDER_SITE_OTHER): Payer: Medicare Other

## 2019-06-14 DIAGNOSIS — Z0000 Encounter for general adult medical examination without abnormal findings: Secondary | ICD-10-CM

## 2019-06-14 DIAGNOSIS — Z23 Encounter for immunization: Secondary | ICD-10-CM | POA: Diagnosis not present

## 2019-06-14 NOTE — Progress Notes (Signed)
I have reviewed and agree with note, evaluation, plan.   [name], MD  

## 2019-06-14 NOTE — Patient Instructions (Signed)
Caleb Horton , Thank you for taking time to come for your Medicare Wellness Visit. I appreciate your ongoing commitment to your health goals. Please review the following plan we discussed and let me know if I can assist you in the future.   Screening recommendations/referrals: Colorectal Screening: up to date; last 2016 with Dr. Raquel James   Vision and Dental Exams: Recommended annual ophthalmology exams for early detection of glaucoma and other disorders of the eye Recommended annual dental exams for proper oral hygiene  Vaccinations: Influenza vaccine: today Pneumococcal vaccine: up to date; last 07/05/14 Tdap vaccine: up to date; last 03/17/18    Shingles vaccine: Please call your insurance company to determine your out of pocket expense for the Shingrix vaccine. You may receive this vaccine at your local pharmacy.  Advanced directives: Please bring a copy of your POA (Power of Attorney) and/or Living Will to your next appointment.  Goals: Recommend to drink at least 6-8 8oz glasses of water per day.  Next appointment: Please schedule your Annual Wellness Visit with your Nurse Health Advisor in one year.  Preventive Care 31 Years and Older, Male Preventive care refers to lifestyle choices and visits with your health care provider that can promote health and wellness. What does preventive care include?  A yearly physical exam. This is also called an annual well check.  Dental exams once or twice a year.  Routine eye exams. Ask your health care provider how often you should have your eyes checked.  Personal lifestyle choices, including:  Daily care of your teeth and gums.  Regular physical activity.  Eating a healthy diet.  Avoiding tobacco and drug use.  Limiting alcohol use.  Practicing safe sex.  Taking low doses of aspirin every day if recommended by your health care provider..  Taking vitamin and mineral supplements as recommended by your health care provider. What  happens during an annual well check? The services and screenings done by your health care provider during your annual well check will depend on your age, overall health, lifestyle risk factors, and family history of disease. Counseling  Your health care provider may ask you questions about your:  Alcohol use.  Tobacco use.  Drug use.  Emotional well-being.  Home and relationship well-being.  Sexual activity.  Eating habits.  History of falls.  Memory and ability to understand (cognition).  Work and work Statistician. Screening  You may have the following tests or measurements:  Height, weight, and BMI.  Blood pressure.  Lipid and cholesterol levels. These may be checked every 5 years, or more frequently if you are over 29 years old.  Skin check.  Lung cancer screening. You may have this screening every year starting at age 65 if you have a 30-pack-year history of smoking and currently smoke or have quit within the past 15 years.  Fecal occult blood test (FOBT) of the stool. You may have this test every year starting at age 80.  Flexible sigmoidoscopy or colonoscopy. You may have a sigmoidoscopy every 5 years or a colonoscopy every 10 years starting at age 62.  Prostate cancer screening. Recommendations will vary depending on your family history and other risks.  Hepatitis C blood test.  Hepatitis B blood test.  Sexually transmitted disease (STD) testing.  Diabetes screening. This is done by checking your blood sugar (glucose) after you have not eaten for a while (fasting). You may have this done every 1-3 years.  Abdominal aortic aneurysm (AAA) screening. You may need this if  you are a current or former smoker.  Osteoporosis. You may be screened starting at age 25 if you are at high risk. Talk with your health care provider about your test results, treatment options, and if necessary, the need for more tests. Vaccines  Your health care provider may recommend  certain vaccines, such as:  Influenza vaccine. This is recommended every year.  Tetanus, diphtheria, and acellular pertussis (Tdap, Td) vaccine. You may need a Td booster every 10 years.  Zoster vaccine. You may need this after age 73.  Pneumococcal 13-valent conjugate (PCV13) vaccine. One dose is recommended after age 16.  Pneumococcal polysaccharide (PPSV23) vaccine. One dose is recommended after age 1. Talk to your health care provider about which screenings and vaccines you need and how often you need them. This information is not intended to replace advice given to you by your health care provider. Make sure you discuss any questions you have with your health care provider. Document Released: 10/24/2015 Document Revised: 06/16/2016 Document Reviewed: 07/29/2015 Elsevier Interactive Patient Education  2017 [place] Prevention in the Home Falls can cause injuries. They can happen to people of all ages. There are many things you can do to make your home safe and to help prevent falls. What can I do on the outside of my home?  Regularly fix the edges of walkways and driveways and fix any cracks.  Remove anything that might make you trip as you walk through a door, such as a raised step or threshold.  Trim any bushes or trees on the path to your home.  Use bright outdoor lighting.  Clear any walking paths of anything that might make someone trip, such as rocks or tools.  Regularly check to see if handrails are loose or broken. Make sure that both sides of any steps have handrails.  Any raised decks and porches should have guardrails on the edges.  Have any leaves, snow, or ice cleared regularly.  Use sand or salt on walking paths during winter.  Clean up any spills in your garage right away. This includes oil or grease spills. What can I do in the bathroom?  Use night lights.  Install grab bars by the toilet and in the tub and shower. Do not use towel bars as  grab bars.  Use non-skid mats or decals in the tub or shower.  If you need to sit down in the shower, use a plastic, non-slip stool.  Keep the floor dry. Clean up any water that spills on the floor as soon as it happens.  Remove soap buildup in the tub or shower regularly.  Attach bath mats securely with double-sided non-slip rug tape.  Do not have throw rugs and other things on the floor that can make you trip. What can I do in the bedroom?  Use night lights.  Make sure that you have a light by your bed that is easy to reach.  Do not use any sheets or blankets that are too big for your bed. They should not hang down onto the floor.  Have a firm chair that has side arms. You can use this for support while you get dressed.  Do not have throw rugs and other things on the floor that can make you trip. What can I do in the kitchen?  Clean up any spills right away.  Avoid walking on wet floors.  Keep items that you use a lot in easy-to-reach places.  If you need to  reach something above you, use a strong step stool that has a grab bar.  Keep electrical cords out of the way.  Do not use floor polish or wax that makes floors slippery. If you must use wax, use non-skid floor wax.  Do not have throw rugs and other things on the floor that can make you trip. What can I do with my stairs?  Do not leave any items on the stairs.  Make sure that there are handrails on both sides of the stairs and use them. Fix handrails that are broken or loose. Make sure that handrails are as long as the stairways.  Check any carpeting to make sure that it is firmly attached to the stairs. Fix any carpet that is loose or worn.  Avoid having throw rugs at the top or bottom of the stairs. If you do have throw rugs, attach them to the floor with carpet tape.  Make sure that you have a light switch at the top of the stairs and the bottom of the stairs. If you do not have them, ask someone to add them  for you. What else can I do to help prevent falls?  Wear shoes that:  Do not have high heels.  Have rubber bottoms.  Are comfortable and fit you well.  Are closed at the toe. Do not wear sandals.  If you use a stepladder:  Make sure that it is fully opened. Do not climb a closed stepladder.  Make sure that both sides of the stepladder are locked into place.  Ask someone to hold it for you, if possible.  Clearly mark and make sure that you can see:  Any grab bars or handrails.  First and last steps.  Where the edge of each step is.  Use tools that help you move around (mobility aids) if they are needed. These include:  Canes.  Walkers.  Scooters.  Crutches.  Turn on the lights when you go into a dark area. Replace any light bulbs as soon as they burn out.  Set up your furniture so you have a clear path. Avoid moving your furniture around.  If any of your floors are uneven, fix them.  If there are any pets around you, be aware of where they are.  Review your medicines with your doctor. Some medicines can make you feel dizzy. This can increase your chance of falling. Ask your doctor what other things that you can do to help prevent falls. This information is not intended to replace advice given to you by your health care provider. Make sure you discuss any questions you have with your health care provider. Document Released: 07/24/2009 Document Revised: 03/04/2016 Document Reviewed: 11/01/2014 Elsevier Interactive Patient Education  2017 [employer].

## 2019-06-14 NOTE — Progress Notes (Signed)
Subjective:   Caleb Horton is a 73 y.o. male who presents for Medicare Annual/Subsequent preventive examination.  Review of Systems:   Cardiac Risk Factors include: advanced age (>3men, >3 women);male gender;hypertension     Objective:    Vitals: BP 128/72 (BP Location: Left Arm, Patient Position: Sitting, Cuff Size: Large)   Temp 98 F (36.7 C) (Temporal)   Ht 6' (1.829 m)   Wt 256 lb (116.1 kg)   BMI 34.72 kg/m   Body mass index is 34.72 kg/m.  Advanced Directives 06/14/2019 05/23/2018 02/02/2017 09/19/2015 06/26/2015 03/20/2015  Does Patient Have a Medical Advance Directive? No;Yes Yes No No No No  Type of Advance Directive Living will;Healthcare Power of [place];Living will - - - -  Does patient want to make changes to medical advance directive? No - Patient declined No - Patient declined - - - -  Copy of [place] in Chart? No - copy requested No - copy requested - - - -  Would patient like information on creating a medical advance directive? - - - - No - patient declined information Yes - Educational materials given    Tobacco Social History   Tobacco Use  Smoking Status Former Smoker  . Packs/day: 0.50  . Years: 3.00  . Pack years: 1.50  . Quit date: 10/11/1968  . Years since quitting: 50.7  Smokeless Tobacco Never Used     Counseling given: Not Answered   Clinical Intake:  Pre-visit preparation completed: Yes  Pain : No/denies pain  Diabetes: No  How often do you need to have someone help you when you read instructions, pamphlets, or other written materials from your doctor or pharmacy?: 1 - Never  Interpreter Needed?: No  Information entered by :: Denman George LPN  Past Medical History:  Diagnosis Date  . Anemia   . Hypertension   . INGROWN NAIL 11/10/2010   Annotation: bilateral  Qualifier: Diagnosis of  By: Arnoldo Morale MD, Balinda Quails    Past Surgical History:  Procedure Laterality Date  .  CIRCUMCISION     age 71   Family History  Problem Relation Age of Onset  . Hypertension Father   . Heart disease Father   . Hearing loss Mother   . Heart disease Daughter   . Obesity Daughter   . Obesity Son   . Diabetes Maternal Grandmother   . Colon cancer Neg Hx    Social History   Socioeconomic History  . Marital status: Married    Spouse name: Ruby  . Number of children: Not on file  . Years of education: Not on file  . Highest education level: Some college, no degree  Occupational History    Employer: RETIRED  Social Needs  . Financial resource strain: Not on file  . Food insecurity    Worry: Not on file    Inability: Not on file  . Transportation needs    Medical: Not on file    Non-medical: Not on file  Tobacco Use  . Smoking status: Former Smoker    Packs/day: 0.50    Years: 3.00    Pack years: 1.50    Quit date: 10/11/1968    Years since quitting: 50.7  . Smokeless tobacco: Never Used  Substance and Sexual Activity  . Alcohol use: Yes    Alcohol/week: 5.0 standard drinks    Types: 1 Shots of liquor, 4 Standard drinks or equivalent per week    Comment:  once a week   . Drug use: No  . Sexual activity: Not on file  Lifestyle  . Physical activity    Days per week: Not on file    Minutes per session: Not on file  . Stress: Not on file  Relationships  . Social Herbalist on phone: Not on file    Gets together: Not on file    Attends religious service: Not on file    Active member of club or organization: Not on file    Attends meetings of clubs or organizations: Not on file    Relationship status: Not on file  Other Topics Concern  . Not on file  Social History Narrative   Married (3rd marriage). 5 children (3 from 1st, 2 from second), 7 grandkids.       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 He lives with his wife in a 2 story house. He drinks one cup of  coffee QOD, and occasionally drinks tea.    Outpatient Encounter Medications as of 06/14/2019  Medication Sig  . atorvastatin (LIPITOR) 10 MG tablet TAKE 1 TABLET BY MOUTH  DAILY  . hydrochlorothiazide (HYDRODIURIL) 12.5 MG tablet Take 1 tablet (12.5 mg total) by mouth daily.  . valsartan (DIOVAN) 80 MG tablet Take 2 tablets (160 mg total) by mouth daily.   No facility-administered encounter medications on file as of 06/14/2019.     Activities of Daily Living In your present state of health, do you have any difficulty performing the following activities: 06/14/2019  Hearing? N  Vision? N  Difficulty concentrating or making decisions? N  Walking or climbing stairs? N  Dressing or bathing? N  Doing errands, shopping? N  Preparing Food and eating ? N  Using the Toilet? N  In the past six months, have you accidently leaked urine? N  Do you have problems with loss of bowel control? N  Managing your Medications? N  Managing your Finances? N  Housekeeping or managing your Housekeeping? N  Some recent data might be hidden    Patient Care Team: Marin Olp, MD as PCP - General (Family Medicine) Buford Dresser, MD as PCP - Cardiology (Cardiology) Buford Dresser, MD as Consulting Physician (Cardiology)   Assessment:   This is a routine wellness examination for Caleb Horton.  Exercise Activities and Dietary recommendations Current Exercise Habits: The patient does not participate in regular exercise at present  Goals    . patient     To fup on foot numbness; stops him from bowling; difficulty sliding     . Weight (lb) < 200 lb (90.7 kg)     Patient would at least like to lose 25lbs in the next year. We discussed losing 2-3lbs a month to make a lifestyle change versus a quick weight loss and gaining weight back       Fall Risk Fall Risk  06/14/2019 05/23/2018 04/21/2018 04/18/2018 02/02/2017  Falls in the past year? 0 No No No No  Number falls in past yr: 0 - - - -  Injury  with Fall? 0 - - - -  Follow up Education provided - - - -   Is the patient's home free of loose throw rugs in walkways, pet beds, electrical cords, etc?   yes      Grab bars in the bathroom? yes      Handrails  on the stairs?   yes      Adequate lighting?   yes  Timed Get Up and Go Performed: completed and within normal timeframe   Depression Screen PHQ 2/9 Scores 06/14/2019 02/28/2019 10/25/2018 05/23/2018  PHQ - 2 Score 0 0 0 0  PHQ- 9 Score - 0 - -    Cognitive Function- no cognitive concerns at this time  MMSE - Mini Mental State Exam 02/02/2017  Not completed: (No Data)     6CIT Screen 06/14/2019 05/23/2018 02/02/2017  What Year? 0 points 0 points 0 points  What month? 0 points 0 points 0 points  What time? 0 points 0 points 0 points  Count back from 20 0 points 0 points 0 points  Months in reverse 0 points 0 points 0 points  Repeat phrase 2 points - 2 points  Total Score 2 - 2    Immunization History  Administered Date(s) Administered  . Influenza Split 08/15/2012  . Influenza, High Dose Seasonal PF 07/14/2013, 07/05/2014  . Influenza-Unspecified 07/20/2015, 07/26/2018  . Pneumococcal Conjugate-13 07/05/2014  . Pneumococcal Polysaccharide-23 02/12/2013  . Td 10/11/2005  . Tdap 03/17/2018  . Zoster 02/04/2015    Qualifies for Shingles Vaccine? Discussed and patient will check with pharmacy for coverage.  Patient education handout provided    Screening Tests Health Maintenance  Topic Date Due  . INFLUENZA VACCINE  05/12/2019  . Hepatitis C Screening  10/08/2098 (Originally 1946/06/17)  . COLONOSCOPY  06/25/2020  . TETANUS/TDAP  03/17/2028  . PNA vac Low Risk Adult  Completed   Cancer Screenings: Lung: Low Dose CT Chest recommended if Age 24-80 years, 30 pack-year currently smoking OR have quit w/in 15years. Patient does not qualify. Colorectal: completed; last 06/26/15 with Dr. Raquel James       Plan:    I have personally reviewed and addressed the Medicare Annual  Wellness questionnaire and have noted the following in the patient's chart:  A. Medical and social history B. Use of alcohol, tobacco or illicit drugs  C. Current medications and supplements D. Functional ability and status E.  Nutritional status F.  Physical activity G. Advance directives H. List of other physicians I.  Hospitalizations, surgeries, and ER visits in previous 12 months J.  [place] such as hearing and vision if needed, cognitive and depression L. Referrals, records requested, and appointments- none   In addition, I have reviewed and discussed with patient certain preventive protocols, quality metrics, and best practice recommendations. A written personalized care plan for preventive services as well as general preventive health recommendations were provided to patient.   Signed,  Denman George, LPN  Nurse Health Advisor   Nurse Notes: no additional

## 2019-06-20 NOTE — Progress Notes (Signed)
Flu vaccine information updated to correct manufacturer

## 2019-06-20 NOTE — Addendum Note (Signed)
Addended by: Denman George B on: 06/20/2019 03:30 PM   Modules accepted: Orders

## 2019-07-18 ENCOUNTER — Other Ambulatory Visit: Payer: Self-pay | Admitting: Family Medicine

## 2019-07-27 ENCOUNTER — Other Ambulatory Visit: Payer: Self-pay | Admitting: Family Medicine

## 2019-08-29 ENCOUNTER — Other Ambulatory Visit: Payer: Self-pay

## 2019-08-29 ENCOUNTER — Encounter: Payer: Self-pay | Admitting: Family Medicine

## 2019-08-29 ENCOUNTER — Ambulatory Visit (INDEPENDENT_AMBULATORY_CARE_PROVIDER_SITE_OTHER): Payer: Medicare Other | Admitting: Family Medicine

## 2019-08-29 VITALS — BP 128/66 | HR 96 | Temp 97.6°F | Ht 72.0 in | Wt 259.6 lb

## 2019-08-29 DIAGNOSIS — R739 Hyperglycemia, unspecified: Secondary | ICD-10-CM | POA: Diagnosis not present

## 2019-08-29 DIAGNOSIS — E785 Hyperlipidemia, unspecified: Secondary | ICD-10-CM | POA: Diagnosis not present

## 2019-08-29 DIAGNOSIS — C61 Malignant neoplasm of prostate: Secondary | ICD-10-CM

## 2019-08-29 DIAGNOSIS — D696 Thrombocytopenia, unspecified: Secondary | ICD-10-CM

## 2019-08-29 DIAGNOSIS — N183 Chronic kidney disease, stage 3 unspecified: Secondary | ICD-10-CM | POA: Diagnosis not present

## 2019-08-29 DIAGNOSIS — I10 Essential (primary) hypertension: Secondary | ICD-10-CM

## 2019-08-29 DIAGNOSIS — D472 Monoclonal gammopathy: Secondary | ICD-10-CM

## 2019-08-29 LAB — COMPREHENSIVE METABOLIC PANEL
ALT: 13 U/L (ref 0–53)
AST: 16 U/L (ref 0–37)
Albumin: 4 g/dL (ref 3.5–5.2)
Alkaline Phosphatase: 76 U/L (ref 39–117)
BUN: 17 mg/dL (ref 6–23)
CO2: 27 mEq/L (ref 19–32)
Calcium: 10.2 mg/dL (ref 8.4–10.5)
Chloride: 106 mEq/L (ref 96–112)
Creatinine, Ser: 1.22 mg/dL (ref 0.40–1.50)
GFR: 70.61 mL/min (ref >60.00)
Glucose, Bld: 118 mg/dL — ABNORMAL HIGH (ref 70–99)
Potassium: 3.7 mEq/L (ref 3.5–5.1)
Sodium: 137 mEq/L (ref 135–145)
Total Bilirubin: 0.5 mg/dL (ref 0.2–1.2)
Total Protein: 9.1 g/dL — ABNORMAL HIGH (ref 6.0–8.3)

## 2019-08-29 LAB — CBC WITH DIFFERENTIAL/PLATELET
Basophils Absolute: 0.1 10*3/uL (ref 0.0–0.1)
Basophils Relative: 0.8 % (ref 0.0–3.0)
Eosinophils Absolute: 0.2 10*3/uL (ref 0.0–0.7)
Eosinophils Relative: 2.6 % (ref 0.0–5.0)
HCT: 39.8 % (ref 39.0–52.0)
Hemoglobin: 13.3 g/dL (ref 13.0–17.0)
Lymphocytes Relative: 28.1 % (ref 12.0–46.0)
Lymphs Abs: 1.9 10*3/uL (ref 0.7–4.0)
MCHC: 33.3 g/dL (ref 30.0–36.0)
MCV: 86.3 fl (ref 78.0–100.0)
Monocytes Absolute: 0.8 10*3/uL (ref 0.1–1.0)
Monocytes Relative: 11.5 % (ref 3.0–12.0)
Neutro Abs: 3.8 10*3/uL (ref 1.4–7.7)
Neutrophils Relative %: 57 % (ref 43.0–77.0)
Platelets: 136 10*3/uL — ABNORMAL LOW (ref 150.0–400.0)
RBC: 4.62 Mil/uL (ref 4.22–5.81)
RDW: 15.1 % (ref 11.5–15.5)
WBC: 6.7 10*3/uL (ref 4.0–10.5)

## 2019-08-29 LAB — PSA: PSA: 3.78 ng/mL (ref 0.10–4.00)

## 2019-08-29 LAB — HEMOGLOBIN A1C: Hgb A1c MFr Bld: 5.9 % (ref 4.6–6.5)

## 2019-08-29 NOTE — Patient Instructions (Addendum)
Lets get back on the regular exercise. Weight is stable through covid 19 and my ask for you through the holidays is to stay stable and then by 6 month physical- lets set a goal of 5 lbs down.   Please stop by lab before you go If you do not have mychart- we will call you about results within 5 business days of [place] receiving them.  If you have mychart- we will send your results within 3 business days of [place] receiving them.  If abnormal or we want to clarify a result, we will call or mychart you to make sure you receive the message.  If you have questions or concerns or don't hear within 5-7 days, please send [place] a message or call us.

## 2019-08-29 NOTE — Progress Notes (Signed)
Phone [phone] In person visit   Subjective:   Caleb Horton is a 73 y.o. year old very pleasant male patient who presents for/with See problem oriented charting Chief Complaint  Patient presents with  . Follow-up  . Hyperlipidemia  . Hypertension   ROS- No chest pain or shortness of breath. No headache or blurry vision.    Past Medical History-  Patient Active Problem List   Diagnosis Date Noted  . Prostate cancer ([place]) 08/31/2017    Priority: High  . AAA (abdominal aortic aneurysm) ([place]) 03/17/2017    Priority: Medium  . Idiopathic neuropathy 02/07/2017    Priority: Medium  . Hyperlipidemia 09/04/2014    Priority: Medium  . CKD (chronic kidney disease), stage III 09/04/2014    Priority: Medium  . Essential hypertension 11/27/2007    Priority: Medium  . History of adenomatous polyp of colon 08/05/2015    Priority: Low  . Hyperglycemia 11/27/2007    Priority: Low  . Sickle-cell trait ([place]) 11/27/2007    Priority: Low  . BPH (benign prostatic hyperplasia) 11/27/2007    Priority: Low  . Thrombocytopenia ([place]) 09/19/2015  . Gammopathy 03/20/2015  . Elevated blood protein 02/04/2015    Medications- reviewed and updated Current Outpatient Medications  Medication Sig Dispense Refill  . atorvastatin (LIPITOR) 10 MG tablet TAKE 1 TABLET BY MOUTH  DAILY 90 tablet 3  . hydrochlorothiazide (HYDRODIURIL) 12.5 MG tablet TAKE 1 TABLET BY MOUTH  DAILY 90 tablet 3  . valsartan (DIOVAN) 80 MG tablet Take 2 tablets (160 mg total) by mouth daily. 180 tablet 3   No current facility-administered medications for this visit.      Objective:  BP 128/66   Pulse 96   Temp 97.6 F (36.4 C)   Ht 6' (1.829 m)   Wt 259 lb 9.6 oz (117.8 kg)   SpO2 96%   BMI 35.21 kg/m  Gen: NAD, resting comfortably CV: RRR no murmurs rubs or gallops Lungs: CTAB no crackles, wheeze, rhonchi Abdomen: soft/nontender/nondistended/normal bowel sounds Ext: no edema Skin: warm, dry     Assessment and Plan   #Prostate cancer-usually would follow every 6 months with urology but states his visits have gotten pushed back due to COVID-19-he would like for me to check a PSA with labs today and [facility] to [facility]  #hypertension/CKD III S: compliant with HCTZ 12.74m and Diovan 1635m pt denies HA, blurred vision and chest discomfort. Pt states diet is good and exercise is not.  CKD III previously listed but last several GFR actually over 60 BP Readings from Last 3 Encounters:  08/29/19 128/66  06/14/19 128/72  02/28/19 136/82  A/P: Stable BP. Continue current medications.   Hopefully GFR remains above 60- would change this to CKD stage II if so and continue to monitor. He avoids nsaids- tylenol only if needed.   #hyperlipidemia S: compliant with lipitor 1061mab Results  Component Value Date   CHOL 129 02/28/2019   HDL 43.80 02/28/2019   LDLCALC 71 02/28/2019   LDLDIRECT 76.0 10/18/2018   TRIG 73.0 02/28/2019   CHOLHDL 3 02/28/2019   A/P: close to ideal goal of 70 or less on LDL- continue current medicines   # Hyperglycemia/insulin resistance/prediabetes S: Exercise and diet- weight stable from may. Has not been as consistent with his video exercises- wants to get back to in person [facility] classes when safe Lab Results  Component Value Date   HGBA1C 5.5 02/22/2018   HGBA1C 5.7 05/17/2014   HGBA1C 5.7  09/17/2013   A/P:  Due to more sedentary activity- decided to update a1c today. Encouraged regular exercise and weight loss as able through healthy diet/exercise -Encouraged 5 pound weight loss by follow-up  #thrombrocytopenia-has seen hematology in the past.  Last platelets were slightly low.  Protein levels have also been off in the past- from 03/19/16 "1. 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 Both his kappa and lambda  light chain are elevated. His 24-hour urine was also negative for Ke[place] Scrape protein. This is more consistent with multiclonal gammopathy. I think MGUS or multiple myeloma is very unlikely. -His repeated SPEP and light chain level again showed no detectable M protein.  -His bone survey was negative -His lab June 2016 also reviewed positive ANA and cANCa, he does not have any clinical signs of rheumatological disease, I encourage him to discuss with his primary care physician Dr. Yong Channel, and to see if he needs a referral to see a rheumatologist -I reviewed the repeated lab results today, his CBC is normal, his CMP revealed high total protein and elevated Cr.  -I do not think he needs further workup, I'll only see him as needed in the future." - update cbc with diff today  #ongoing issues with neuropathy- wants to monitor without meds.   Recommended follow up: 60-monthphysical recommended  Lab/Order associations:   ICD-10-CM   1. Essential hypertension  I10 CBC with Differential    Comprehensive metabolic panel  2. Stage 3 chronic kidney disease, unspecified whether stage 3a or 3b CKD  N18.30   3. Hyperlipidemia, unspecified hyperlipidemia type  E78.5 CBC with Differential    Comprehensive metabolic panel  4. Hyperglycemia  R73.9 Hemoglobin A1c  5. Thrombocytopenia (HCC)  D69.6 CBC with Differential    Comprehensive metabolic panel  6. [place] cancer [place]  C61 PSA   Return precautions advised.  SGarret Reddish MD

## 2020-02-01 LAB — HEMOGLOBIN A1C: Hemoglobin A1C: 6.6

## 2020-02-04 ENCOUNTER — Telehealth: Payer: Self-pay

## 2020-02-04 NOTE — Telephone Encounter (Signed)
Patient may have diabetes.  I want him to work on healthy eating/regular exercise and please schedule him a visit in 3 months to recheck.  Please tell him if his number stays this high that we will have to diagnose diabetes next visit  Nutrition facts.org search under prediabetes has good information.  Also- You may find this book helpful for your diabetes. It is available on [place] and is fairly inexpensive: Dr. Janene Harvey Program for Reversing Diabetes: The Scientifically Proven System for Reversing Diabetes without Drugs http://www.nealbarnard.org/index.cfm

## 2020-02-04 NOTE — Telephone Encounter (Signed)
Called and spoke with pt and gave him the below message, 3 month f/u scheduled.

## 2020-02-12 ENCOUNTER — Other Ambulatory Visit: Payer: Self-pay | Admitting: Family Medicine

## 2020-04-03 DIAGNOSIS — R350 Frequency of micturition: Secondary | ICD-10-CM | POA: Diagnosis not present

## 2020-04-03 LAB — PSA: PSA: 4.97

## 2020-05-05 NOTE — Patient Instructions (Addendum)
Please stop by lab before you go If you have mychart- we will send your results within 3 business days of [place] receiving them.  If you do not have mychart- we will call you about results within 5 business days of [place] receiving them.   Hopefully no diabtes on labs! Great job on weight loss! Keep up the good work

## 2020-05-05 NOTE — Progress Notes (Signed)
Phone [phone] In person visit   Subjective:   Caleb Horton is a 74 y.o. year old very pleasant male patient who presents for/with See problem oriented charting Chief Complaint  Patient presents with  . chronic kidney disease stage 3    denies checking blood sugars at home. States he is dieting and exercising    This visit occurred during the SARS-CoV-2 public health emergency.  Safety protocols were in place, including screening questions prior to the visit, additional usage of staff PPE, and extensive cleaning of exam room while observing appropriate contact time as indicated for disinfecting solutions.   Past Medical History-  Patient Active Problem List   Diagnosis Date Noted  . Prostate cancer ([place]) 08/31/2017    Priority: High  . Gammopathy 03/20/2015    Priority: High  . AAA (abdominal aortic aneurysm) ([place]) 03/17/2017    Priority: Medium  . Idiopathic neuropathy 02/07/2017    Priority: Medium  . Hyperlipidemia 09/04/2014    Priority: Medium  . CKD (chronic kidney disease), 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    Priority: Medium  . Essential hypertension 11/27/2007    Priority: Medium  . History of adenomatous polyp of colon 08/05/2015    Priority: Low  . Elevated blood protein 02/04/2015    Priority: Low  . Hyperglycemia 11/27/2007    Priority: Low  . Sickle-cell trait ([place]) 11/27/2007    Priority: Low  . BPH (benign prostatic hyperplasia) 11/27/2007    Priority: Low  . Thrombocytopenia ([place]) 09/19/2015    Medications- reviewed and updated Current Outpatient Medications  Medication Sig Dispense Refill  . atorvastatin (LIPITOR) 10 MG tablet TAKE 1 TABLET BY MOUTH  DAILY 90 tablet 3  . hydrochlorothiazide (HYDRODIURIL) 12.5 MG tablet TAKE 1 TABLET BY MOUTH  DAILY 90 tablet 3  . valsartan (DIOVAN) 80 MG tablet TAKE 2 TABLETS BY MOUTH  DAILY 180 tablet 3  . triamcinolone ointment (KENALOG) 0.5 % Apply 1 application topically 2 (two) times daily. Up to 7 days as needed  for skin irritation such as poison ivy 60 g 1   No current facility-administered medications for this visit.     Objective:  BP 122/78   Pulse (!) 108   Temp 97.6 F (36.4 C) (Temporal)   Ht 6' (1.829 m)   Wt (!) 241 lb (109.3 kg)   SpO2 94%   BMI 32.69 kg/m  Gen: NAD, resting comfortably CV: RRR no murmurs rubs or gallops. HR baseline usually 80s to 100s.  Lungs: CTAB no crackles, wheeze, rhonchi Abdomen: soft/nontender/nondistended/normal bowel sounds. No rebound or guarding.  Ext: no edema Skin: warm, dry    Assessment and Plan   # Hyperglycemia/insulin resistance/prediabetes S:  Medication: None Exercise and diet- states back at [facility] and he has lost 18 lbs from last visit! Reasonably healthy diet. Cut back on sweet tea.  Lab Results  Component Value Date   HGBA1C- at home nurse test 6.6 02/01/2020   HGBA1C 5.9 08/29/2019   HGBA1C 5.5 02/22/2018   A/P: Had a whole test which was concerning for development of diabetes-the numbers are so different from the numbers we have gotten in clinic that I want to recheck today-update blood work  # Prostate Cancer S: had a visit just a few weeks ago and was told come back in 1 year. PSA was higher than my last check but still in monitoring range  Lab Results  Component Value Date   PSA 4.97 04/03/2020   PSA 3.78 08/29/2019  PSA 5.24 (H) 02/24/2018  A/P: Patient continues in active surveillance mode-continue close follow-up with urology.  #hypertension S: medication: diovan 160mg  and hctz 12.5mg  Home readings #s:  Does not check home Bp BP Readings from Last 3 Encounters:  05/06/20 122/78  08/29/19 128/66  06/14/19 128/72  A/P: Stable. Continue current medications.   #Chronic kidney disease stage II S: GFR is typically in the >60 range. In past had listed as CKD III but most recent #s have improved -Patient knows to avoid NSAIDs  A/P: We are going to change diagnosis from CKD stage III to CKD stage  II-numbers have looked excellent the last few checks-we will update today with labs  #hyperlipidemia S: Medication:atorvastatin 10mg   Lab Results  Component Value Date   CHOL 129 02/28/2019   HDL 43.80 02/28/2019   LDLCALC 71 02/28/2019   LDLDIRECT 76.0 10/18/2018   TRIG 73.0 02/28/2019   CHOLHDL 3 02/28/2019   A/P: Hopefully cholesterol remains well controlled-continue current medications for now and update lipid panel  #Gammopathy-patient was released from hematology in 2017-we will update CBC with differential with blood work today.  #Thrombocytopenia-likely related to gammopathy-we will trend CBC with labs   #saw dermatologist in the past years ago after a bug bite in room he stayed in. with sounds like severe local reaction-he did really well with this so with desoximetasone 0.25% ointment years ago-he has had similar issues when his skin is irritated-asks for a refill of this.  We opted for triamcinolone ointment as I think this will be cheaper for him-recommended only using for up to 7 days if has similar irritation in the future and to see [place] if not improving  #Abdominal aortic aneurysm-noted on initial screening test but on follow-up screening test in 2019 was not noted-I have contacted radiologist at that time and no further repeat was recommended  #Sickle cell trait-noted.  No history of significant issues related to this.  Recommended follow up: Return in about 6 months (around 11/06/2020) for physical or sooner if needed.  Lab/Order associations: non fasting   ICD-10-CM   1. Hyperglycemia  R73.9 Hemoglobin A1c  2. CKD (chronic kidney disease), stage II  N18.2   3. Prostate cancer ([place])  C61   4. Hyperlipidemia, unspecified hyperlipidemia type  E78.5 Comprehensive metabolic panel    CBC with Differential/Platelet    Lipid panel  5. Essential hypertension  I10   6. Abdominal aortic aneurysm (AAA) without rupture (HCC)  I71.4    Meds ordered this encounter  Medications   . triamcinolone ointment (KENALOG) 0.5 %    Sig: Apply 1 application topically 2 (two) times daily. Up to 7 days as needed for skin irritation such as poison ivy    Dispense:  60 g    Refill:  1   Return precautions advised.  Garret Reddish, MD

## 2020-05-06 ENCOUNTER — Ambulatory Visit (INDEPENDENT_AMBULATORY_CARE_PROVIDER_SITE_OTHER): Payer: Medicare Other | Admitting: Family Medicine

## 2020-05-06 ENCOUNTER — Encounter: Payer: Self-pay | Admitting: Family Medicine

## 2020-05-06 ENCOUNTER — Other Ambulatory Visit: Payer: Self-pay

## 2020-05-06 VITALS — BP 122/78 | HR 108 | Temp 97.6°F | Ht 72.0 in | Wt 241.0 lb

## 2020-05-06 DIAGNOSIS — N182 Chronic kidney disease, stage 2 (mild): Secondary | ICD-10-CM | POA: Diagnosis not present

## 2020-05-06 DIAGNOSIS — C61 Malignant neoplasm of prostate: Secondary | ICD-10-CM

## 2020-05-06 DIAGNOSIS — I714 Abdominal aortic aneurysm, without rupture, unspecified: Secondary | ICD-10-CM

## 2020-05-06 DIAGNOSIS — E785 Hyperlipidemia, unspecified: Secondary | ICD-10-CM

## 2020-05-06 DIAGNOSIS — I10 Essential (primary) hypertension: Secondary | ICD-10-CM | POA: Diagnosis not present

## 2020-05-06 DIAGNOSIS — R739 Hyperglycemia, unspecified: Secondary | ICD-10-CM

## 2020-05-06 MED ORDER — TRIAMCINOLONE ACETONIDE 0.5 % EX OINT: 1.0000 "application " | TOPICAL_OINTMENT | Freq: Two times a day (BID) | CUTANEOUS | 1 refills | Status: DC

## 2020-05-06 NOTE — Addendum Note (Signed)
Addended by: Liliane Channel on: 05/06/2020 03:46 PM   Modules accepted: Orders

## 2020-05-07 LAB — CBC WITH DIFFERENTIAL/PLATELET
Absolute Monocytes: 672 cells/uL (ref 200–950)
Basophils Absolute: 29 cells/uL (ref 0–200)
Basophils Relative: 0.4 %
Eosinophils Absolute: 131 cells/uL (ref 15–500)
Eosinophils Relative: 1.8 %
HCT: 38.2 % — ABNORMAL LOW (ref 38.5–50.0)
Hemoglobin: 12.5 g/dL — ABNORMAL LOW (ref 13.2–17.1)
Lymphs Abs: 1832 cells/uL (ref 850–3900)
MCH: 27.7 pg (ref 27.0–33.0)
MCHC: 32.7 g/dL (ref 32.0–36.0)
MCV: 84.7 fL (ref 80.0–100.0)
MPV: 11.4 fL (ref 7.5–12.5)
Monocytes Relative: 9.2 %
Neutro Abs: 4636 cells/uL (ref 1500–7800)
Neutrophils Relative %: 63.5 %
Platelets: 152 10*3/uL (ref 140–400)
RBC: 4.51 10*6/uL (ref 4.20–5.80)
RDW: 14.2 % (ref 11.0–15.0)
Total Lymphocyte: 25.1 %
WBC: 7.3 10*3/uL (ref 3.8–10.8)

## 2020-05-07 LAB — HEMOGLOBIN A1C
Hgb A1c MFr Bld: 5.8 % of total Hgb — ABNORMAL HIGH (ref <5.7)
Mean Plasma Glucose: 120 (calc)
eAG (mmol/L): 6.6 (calc)

## 2020-05-07 LAB — COMPREHENSIVE METABOLIC PANEL
AG Ratio: 0.9 (calc) — ABNORMAL LOW (ref 1.0–2.5)
ALT: 11 U/L (ref 9–46)
AST: 17 U/L (ref 10–35)
Albumin: 4.1 g/dL (ref 3.6–5.1)
Alkaline phosphatase (APISO): 66 U/L (ref 35–144)
BUN/Creatinine Ratio: 11 (calc) (ref 6–22)
BUN: 17 mg/dL (ref 7–25)
CO2: 25 mmol/L (ref 20–32)
Calcium: 10.3 mg/dL (ref 8.6–10.3)
Chloride: 106 mmol/L (ref 98–110)
Creat: 1.54 mg/dL — ABNORMAL HIGH (ref 0.70–1.18)
Globulin: 4.5 g/dL (calc) — ABNORMAL HIGH (ref 1.9–3.7)
Glucose, Bld: 97 mg/dL (ref 65–99)
Potassium: 3.6 mmol/L (ref 3.5–5.3)
Sodium: 138 mmol/L (ref 135–146)
Total Bilirubin: 0.6 mg/dL (ref 0.2–1.2)
Total Protein: 8.6 g/dL — ABNORMAL HIGH (ref 6.1–8.1)

## 2020-05-07 LAB — LIPID PANEL
Cholesterol: 146 mg/dL (ref <200)
HDL: 39 mg/dL — ABNORMAL LOW (ref >=40)
LDL Cholesterol (Calc): 82 mg/dL (calc)
Non-HDL Cholesterol (Calc): 107 mg/dL (calc) (ref <130)
Total CHOL/HDL Ratio: 3.7 (calc) (ref <5.0)
Triglycerides: 155 mg/dL — ABNORMAL HIGH (ref <150)

## 2020-06-10 ENCOUNTER — Other Ambulatory Visit: Payer: Self-pay | Admitting: Family Medicine

## 2020-06-14 ENCOUNTER — Other Ambulatory Visit: Payer: Self-pay | Admitting: Family Medicine

## 2020-07-10 ENCOUNTER — Ambulatory Visit (INDEPENDENT_AMBULATORY_CARE_PROVIDER_SITE_OTHER): Payer: Medicare Other

## 2020-07-10 DIAGNOSIS — Z0000 Encounter for general adult medical examination without abnormal findings: Secondary | ICD-10-CM

## 2020-07-10 DIAGNOSIS — Z1211 Encounter for screening for malignant neoplasm of colon: Secondary | ICD-10-CM | POA: Diagnosis not present

## 2020-07-10 DIAGNOSIS — Z86010 Personal history of colonic polyps: Secondary | ICD-10-CM

## 2020-07-10 NOTE — Patient Instructions (Addendum)
Caleb Horton , Thank you for taking time to come for your Medicare Wellness Visit. I appreciate your ongoing commitment to your health goals. Please review the following plan we discussed and let me know if I can assist you in the future.   Screening recommendations/referrals: Colonoscopy: Done 06/26/15, order place today for GI Recommended yearly ophthalmology/optometry visit for glaucoma screening and checkup Recommended yearly dental visit for hygiene and checkup  Vaccinations: Influenza vaccine: Due and discussed Pneumococcal vaccine: Up to date Tdap vaccine: Up to date Shingles vaccine: Shingrix discussed. Please contact your pharmacy for coverage information.    Covid-19: Completed 12/04/19 & 12/25/19  Advanced directives: Please bring a copy of your health care power of attorney and living will to the office at your convenience.  Conditions/risks identified: Lose weight  Next appointment: Follow up in one year for your annual wellness visit.   Preventive Care 73 Years and Older, Male Preventive care refers to lifestyle choices and visits with your health care provider that can promote health and wellness. What does preventive care include?  A yearly physical exam. This is also called an annual well check.  Dental exams once or twice a year.  Routine eye exams. Ask your health care provider how often you should have your eyes checked.  Personal lifestyle choices, including:  Daily care of your teeth and gums.  Regular physical activity.  Eating a healthy diet.  Avoiding tobacco and drug use.  Limiting alcohol use.  Practicing safe sex.  Taking low doses of aspirin every day.  Taking vitamin and mineral supplements as recommended by your health care provider. What happens during an annual well check? The services and screenings done by your health care provider during your annual well check will depend on your age, overall health, lifestyle risk factors, and family  history of disease. Counseling  Your health care provider may ask you questions about your:  Alcohol use.  Tobacco use.  Drug use.  Emotional well-being.  Home and relationship well-being.  Sexual activity.  Eating habits.  History of falls.  Memory and ability to understand (cognition).  Work and work Statistician. Screening  You may have the following tests or measurements:  Height, weight, and BMI.  Blood pressure.  Lipid and cholesterol levels. These may be checked every 5 years, or more frequently if you are over 41 years old.  Skin check.  Lung cancer screening. You may have this screening every year starting at age 73 if you have a 30-pack-year history of smoking and currently smoke or have quit within the past 15 years.  Fecal occult blood test (FOBT) of the stool. You may have this test every year starting at age 3.  Flexible sigmoidoscopy or colonoscopy. You may have a sigmoidoscopy every 5 years or a colonoscopy every 10 years starting at age 69.  Prostate cancer screening. Recommendations will vary depending on your family history and other risks.  Hepatitis C blood test.  Hepatitis B blood test.  Sexually transmitted disease (STD) testing.  Diabetes screening. This is done by checking your blood sugar (glucose) after you have not eaten for a while (fasting). You may have this done every 1-3 years.  Abdominal aortic aneurysm (AAA) screening. You may need this if you are a current or former smoker.  Osteoporosis. You may be screened starting at age 73 if you are at high risk. Talk with your health care provider about your test results, treatment options, and if necessary, the need for more tests.  Vaccines  Your health care provider may recommend certain vaccines, such as:  Influenza vaccine. This is recommended every year.  Tetanus, diphtheria, and acellular pertussis (Tdap, Td) vaccine. You may need a Td booster every 10 years.  Zoster vaccine.  You may need this after age 73.  Pneumococcal 13-valent conjugate (PCV13) vaccine. One dose is recommended after age 73.  Pneumococcal polysaccharide (PPSV23) vaccine. One dose is recommended after age 73. Talk to your health care provider about which screenings and vaccines you need and how often you need them. This information is not intended to replace advice given to you by your health care provider. Make sure you discuss any questions you have with your health care provider. Document Released: 10/24/2015 Document Revised: 06/16/2016 Document Reviewed: 07/29/2015 Elsevier Interactive Patient Education  2017 [place] Prevention in the Home Falls can cause injuries. They can happen to people of all ages. There are many things you can do to make your home safe and to help prevent falls. What can I do on the outside of my home?  Regularly fix the edges of walkways and driveways and fix any cracks.  Remove anything that might make you trip as you walk through a door, such as a raised step or threshold.  Trim any bushes or trees on the path to your home.  Use bright outdoor lighting.  Clear any walking paths of anything that might make someone trip, such as rocks or tools.  Regularly check to see if handrails are loose or broken. Make sure that both sides of any steps have handrails.  Any raised decks and porches should have guardrails on the edges.  Have any leaves, snow, or ice cleared regularly.  Use sand or salt on walking paths during winter.  Clean up any spills in your garage right away. This includes oil or grease spills. What can I do in the bathroom?  Use night lights.  Install grab bars by the toilet and in the tub and shower. Do not use towel bars as grab bars.  Use non-skid mats or decals in the tub or shower.  If you need to sit down in the shower, use a plastic, non-slip stool.  Keep the floor dry. Clean up any water that spills on the floor as soon  as it happens.  Remove soap buildup in the tub or shower regularly.  Attach bath mats securely with double-sided non-slip rug tape.  Do not have throw rugs and other things on the floor that can make you trip. What can I do in the bedroom?  Use night lights.  Make sure that you have a light by your bed that is easy to reach.  Do not use any sheets or blankets that are too big for your bed. They should not hang down onto the floor.  Have a firm chair that has side arms. You can use this for support while you get dressed.  Do not have throw rugs and other things on the floor that can make you trip. What can I do in the kitchen?  Clean up any spills right away.  Avoid walking on wet floors.  Keep items that you use a lot in easy-to-reach places.  If you need to reach something above you, use a strong step stool that has a grab bar.  Keep electrical cords out of the way.  Do not use floor polish or wax that makes floors slippery. If you must use wax, use non-skid floor wax.  Do not have throw rugs and other things on the floor that can make you trip. What can I do with my stairs?  Do not leave any items on the stairs.  Make sure that there are handrails on both sides of the stairs and use them. Fix handrails that are broken or loose. Make sure that handrails are as long as the stairways.  Check any carpeting to make sure that it is firmly attached to the stairs. Fix any carpet that is loose or worn.  Avoid having throw rugs at the top or bottom of the stairs. If you do have throw rugs, attach them to the floor with carpet tape.  Make sure that you have a light switch at the top of the stairs and the bottom of the stairs. If you do not have them, ask someone to add them for you. What else can I do to help prevent falls?  Wear shoes that:  Do not have high heels.  Have rubber bottoms.  Are comfortable and fit you well.  Are closed at the toe. Do not wear sandals.  If  you use a stepladder:  Make sure that it is fully opened. Do not climb a closed stepladder.  Make sure that both sides of the stepladder are locked into place.  Ask someone to hold it for you, if possible.  Clearly mark and make sure that you can see:  Any grab bars or handrails.  First and last steps.  Where the edge of each step is.  Use tools that help you move around (mobility aids) if they are needed. These include:  Canes.  Walkers.  Scooters.  Crutches.  Turn on the lights when you go into a dark area. Replace any light bulbs as soon as they burn out.  Set up your furniture so you have a clear path. Avoid moving your furniture around.  If any of your floors are uneven, fix them.  If there are any pets around you, be aware of where they are.  Review your medicines with your doctor. Some medicines can make you feel dizzy. This can increase your chance of falling. Ask your doctor what other things that you can do to help prevent falls. This information is not intended to replace advice given to you by your health care provider. Make sure you discuss any questions you have with your health care provider. Document Released: 07/24/2009 Document Revised: 03/04/2016 Document Reviewed: 11/01/2014 Elsevier Interactive Patient Education  2017 [employer].

## 2020-07-10 NOTE — Progress Notes (Signed)
Virtual Visit via Telephone Note  I connected with  Caleb Horton on 07/10/20 at 11:45 AM EDT by telephone and verified that I am speaking with the correct person using two identifiers.  Medicare Annual Wellness visit completed telephonically due to Covid-19 pandemic.   Persons participating in this call: This Health Coach and this patient.   Location: Patient: Home Provider: Office   I discussed the limitations, risks, security and privacy concerns of performing an evaluation and management service by telephone and the availability of in person appointments. The patient expressed understanding and agreed to proceed.  Unable to perform video visit due to video visit attempted and failed and/or patient does not have video capability.   Some vital signs may be absent or patient reported.   [name], LPN    Subjective:   Caleb Horton is a 74 y.o. male who presents for Medicare Annual/Subsequent preventive examination.  Review of Systems     Cardiac Risk Factors include: hypertension;dyslipidemia;male gender;obesity (BMI >30kg/m2)     Objective:    There were no vitals filed for this visit. There is no height or weight on file to calculate BMI.  Advanced Directives 07/10/2020 06/14/2019 05/23/2018 02/02/2017 09/19/2015 06/26/2015 03/20/2015  Does Patient Have a Medical Advance Directive? Yes No;Yes Yes No No No No  Type of Paramedic of [place];Living will Living will;Healthcare Power of [place];Living will - - - -  Does patient want to make changes to medical advance directive? - No - Patient declined No - Patient declined - - - -  Copy of [place] in Chart? No - copy requested No - copy requested No - copy requested - - - -  Would patient like information on creating a medical advance directive? - - - - - No - patient declined information Yes - Educational materials given    Current Medications  (verified) Outpatient Encounter Medications as of 07/10/2020  Medication Sig  . atorvastatin (LIPITOR) 10 MG tablet TAKE 1 TABLET BY MOUTH  DAILY  . hydrochlorothiazide (HYDRODIURIL) 12.5 MG tablet TAKE 1 TABLET BY MOUTH  DAILY  . triamcinolone ointment (KENALOG) 0.5 % Apply 1 application topically 2 (two) times daily. Up to 7 days as needed for skin irritation such as poison ivy  . valsartan (DIOVAN) 80 MG tablet TAKE 2 TABLETS BY MOUTH  DAILY   No facility-administered encounter medications on file as of 07/10/2020.    Allergies (verified) Losartan   History: Past Medical History:  Diagnosis Date  . Anemia   . Hypertension   . INGROWN NAIL 11/10/2010   Annotation: bilateral  Qualifier: Diagnosis of  By: Arnoldo Morale MD, Balinda Quails    Past Surgical History:  Procedure Laterality Date  . CIRCUMCISION     age 60   Family History  Problem Relation Age of Onset  . Hypertension Father   . Heart disease Father   . Hearing loss Mother   . Heart disease Daughter   . Obesity Daughter   . Obesity Son   . Diabetes Maternal Grandmother   . Colon cancer Neg Hx    Social History   Socioeconomic History  . Marital status: Married    Spouse name: Caleb Horton  . Number of children: Not on file  . Years of education: Not on file  . Highest education level: Some college, no degree  Occupational History    Employer: RETIRED  Tobacco Use  . Smoking status: Former Smoker  Packs/day: 0.50    Years: 3.00    Pack years: 1.50    Quit date: 10/11/1968    Years since quitting: 51.7  . Smokeless tobacco: Never Used  Vaping Use  . Vaping Use: Never used  Substance and Sexual Activity  . Alcohol use: Yes    Alcohol/week: 5.0 standard drinks    Types: 1 Shots of liquor, 4 Standard drinks or equivalent per week    Comment: once a week   . Drug use: No  . Sexual activity: Not on file  Other Topics Concern  . Not on file  Social History Narrative   Married (3rd marriage). 5 children (3 from 1st, 2  from second), 7 grandkids.       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 He lives with his wife in a 2 story house. He drinks one cup of coffee QOD, and occasionally drinks tea.   Social Determinants of Health   Financial Resource Strain: Low Risk   . Difficulty of Paying Living Expenses: Not hard at all  Food Insecurity: No Food Insecurity  . Worried About Charity fundraiser in the Last Year: Never true  . Ran Out of Food in the Last Year: Never true  Transportation Needs: No Transportation Needs  . Lack of Transportation (Medical): No  . Lack of Transportation (Non-Medical): No  Physical Activity: Sufficiently Active  . Days of Exercise per Week: 4 days  . Minutes of Exercise per Session: 50 min  Stress: No Stress Concern Present  . Feeling of Stress : Not at all  Social Connections: Moderately Integrated  . Frequency of Communication with Friends and Family: Three times a week  . Frequency of Social Gatherings with Friends and Family: Twice a week  . Attends Religious Services: Never  . Active Member of Clubs or Organizations: Yes  . Attends Archivist Meetings: 1 to 4 times per year  . Marital Status: Married    Tobacco Counseling Counseling given: Not Answered   Clinical Intake:  Pre-visit preparation completed: Yes  Pain : No/denies pain     BMI - recorded: 32.69 Nutritional Status: BMI > 30  Obese Nutritional Risks: None Diabetes: No  How often do you need to have someone help you when you read instructions, pamphlets, or other written materials from your doctor or pharmacy?: 1 - Never  Diabetic?No  Interpreter Needed?: No  Information entered by :: Charlott Rakes, LPN   Activities of Daily Living In your present state of health, do you have any difficulty performing the following activities: 07/10/2020 05/06/2020  Hearing? N N  Vision? N N  Difficulty  concentrating or making decisions? N N  Walking or climbing stairs? N N  Dressing or bathing? N N  Doing errands, shopping? N N  Preparing Food and eating ? N -  Using the Toilet? N -  In the past six months, have you accidently leaked urine? N -  Do you have problems with loss of bowel control? N -  Managing your Medications? N -  Managing your Finances? N -  Housekeeping or managing your Housekeeping? N -  Some recent data might be hidden    Patient Care Team: Marin Olp, MD as PCP - General (Family Medicine) Buford Dresser, MD as PCP - Cardiology (Cardiology) Buford Dresser, MD as Consulting Physician (Cardiology)  Indicate any recent Medical Services  you may have received from other than [facility] providers in the past year (date may be approximate).     Assessment:   This is a routine wellness examination for Wilber.  Hearing/Vision screen  Hearing Screening   125Hz  250Hz  500Hz  1000Hz  2000Hz  3000Hz  4000Hz  6000Hz  8000Hz   Right ear:           Left ear:           Comments: Pt denies any difficulty hearing at this time  Vision Screening Comments: Pt has follows up with  [place]  Dietary issues and exercise activities discussed: Current Exercise Habits: Home exercise routine, Type of exercise: walking;stretching;strength training/weights, Time (Minutes): 60, Frequency (Times/Week): 4, Weekly Exercise (Minutes/Week): 240  Goals    . patient     To fup on foot numbness; stops him from bowling; difficulty sliding     . Patient Stated     Lose weight    . Weight (lb) < 200 lb (90.7 kg)     Patient would at least like to lose 25lbs in the next year. We discussed losing 2-3lbs a month to make a lifestyle change versus a quick weight loss and gaining weight back      Depression Screen PHQ 2/9 Scores 07/10/2020 08/29/2019 06/14/2019 02/28/2019 10/25/2018 05/23/2018 04/21/2018  PHQ - 2 Score 0 0 0 0 0 0 0  PHQ- 9 Score - 0 - 0 - - -    Fall  Risk Fall Risk  07/10/2020 05/06/2020 06/14/2019 05/23/2018 04/21/2018  Falls in the past year? 0 0 0 No No  Number falls in past yr: 0 0 0 - -  Injury with Fall? 0 0 0 - -  Risk for fall due to : Impaired vision - - - -  Follow up Falls prevention discussed - Education provided - -    Any stairs in or around the home? Yes  If so, are there any without handrails? No  Home free of loose throw rugs in walkways, pet beds, electrical cords, etc? Yes  Adequate lighting in your home to reduce risk of falls? Yes   ASSISTIVE DEVICES UTILIZED TO PREVENT FALLS:  Life alert? No  Use of a cane, walker or w/c? No  Grab bars in the bathroom? No  Shower chair or bench in shower? No  Elevated toilet seat or a handicapped toilet? No   TIMED UP AND GO:  Was the test performed? No .      Cognitive Function: MMSE - Mini Mental State Exam 02/02/2017  Not completed: (No Data)     6CIT Screen 07/10/2020 06/14/2019 05/23/2018 02/02/2017  What Year? 0 points 0 points 0 points 0 points  What month? 0 points 0 points 0 points 0 points  What time? - 0 points 0 points 0 points  Count back from 20 0 points 0 points 0 points 0 points  Months in reverse 0 points 0 points 0 points 0 points  Repeat phrase 0 points 2 points - 2 points  Total Score - 2 - 2    Immunizations Immunization History  Administered Date(s) Administered  . Fluad Quad(high Dose 65+) 06/14/2019  . Influenza Split 08/15/2012  . Influenza, High Dose Seasonal PF 07/14/2013, 07/05/2014  . Influenza-Unspecified 07/20/2015, 07/26/2018  . PFIZER SARS-COV-2 Vaccination 12/04/2019, 12/25/2019  . Pneumococcal Conjugate-13 07/05/2014  . Pneumococcal Polysaccharide-23 02/12/2013  . Td 10/11/2005  . Tdap 03/17/2018  . Zoster 02/04/2015    TDAP status: Up to date Flu Vaccine status:  Declined, Education has been provided regarding the importance of this vaccine but patient still declined. Advised may receive this vaccine at local pharmacy or  Health Dept. Aware to provide a copy of the vaccination record if obtained from local pharmacy or Health Dept. Verbalized acceptance and understanding. Pneumococcal vaccine status: Up to date Covid-19 vaccine status: Completed vaccines  Qualifies for Shingles Vaccine? Yes   Zostavax completed No   Shingrix Completed?: No.    Education has been provided regarding the importance of this vaccine. Patient has been advised to call insurance company to determine out of pocket expense if they have not yet received this vaccine. Advised may also receive vaccine at local pharmacy or Health Dept. Verbalized acceptance and understanding.  Screening Tests Health Maintenance  Topic Date Due  . INFLUENZA VACCINE  05/11/2020  . COLONOSCOPY  06/25/2020  . Hepatitis C Screening  10/08/2098 (Originally 1946-02-16)  . TETANUS/TDAP  03/17/2028  . COVID-19 Vaccine  Completed  . PNA vac Low Risk Adult  Completed    Health Maintenance  Health Maintenance Due  Topic Date Due  . INFLUENZA VACCINE  05/11/2020  . COLONOSCOPY  06/25/2020    Colorectal cancer screening: Referral to GI placed 07/10/20. Pt aware the office will call re: appt.   Additional Screening:  Hepatitis C Screening: does qualify;  Vision Screening: Recommended annual ophthalmology exams for early detection of glaucoma and other disorders of the eye. Is the patient up to date with their annual eye exam?  Yes  Who is the provider or what is the name of the office in which the patient attends annual eye exams? [facility]   Dental Screening: Recommended annual dental exams for proper oral hygiene  Community Resource Referral / Chronic Care Management: CRR required this visit?  No   CCM required this visit?  No      Plan:     I have personally reviewed and noted the following in the patient's chart:   . Medical and social history . Use of alcohol, tobacco or illicit drugs  . Current medications and  supplements . Functional ability and status . Nutritional status . Physical activity . Advanced directives . List of other physicians . Hospitalizations, surgeries, and ER visits in previous 12 months . Vitals . Screenings to include cognitive, depression, and falls . Referrals and appointments  In addition, I have reviewed and discussed with patient certain preventive protocols, quality metrics, and best practice recommendations. A written personalized care plan for preventive services as well as general preventive health recommendations were provided to patient.     [name], LPN   5/52/0802   Nurse Notes: None

## 2020-07-15 ENCOUNTER — Ambulatory Visit: Payer: Self-pay | Attending: Internal Medicine

## 2020-07-15 DIAGNOSIS — Z23 Encounter for immunization: Secondary | ICD-10-CM

## 2020-07-15 NOTE — Progress Notes (Signed)
   Covid-19 Vaccination Clinic  Name:  Larri Yehle    MRN: [id number] DOB: 12-01-1945  07/15/2020  Mr. Fredderick Severance was observed post Covid-19 immunization for 15 minutes without incident. He was provided with Vaccine Information Sheet and instruction to access the V-Safe system.   Mr. Fredderick Severance was instructed to call 911 with any severe reactions post vaccine: Marland Kitchen Difficulty breathing  . Swelling of face and throat  . A fast heartbeat  . A bad rash all over body  . Dizziness and weakness

## 2020-08-11 ENCOUNTER — Telehealth: Payer: Self-pay | Admitting: Family Medicine

## 2020-08-11 NOTE — Chronic Care Management (AMB) (Signed)
  Chronic Care Management   Note  08/11/2020 Name: Caleb Horton MRN: [id number] DOB: 1946-09-02  Caleb Horton is a 74 y.o. year old male who is a primary care patient of Marin Olp, MD. I reached out to [name] by phone today in response to a referral sent by [name]'s PCP, Marin Olp, MD.   [name] was given information about Chronic Care Management services today including:  1. CCM service includes personalized support from designated clinical staff supervised by his physician, including individualized plan of care and coordination with other care providers 2. 24/7 contact phone numbers for assistance for urgent and routine care needs. 3. Service will only be billed when office clinical staff spend 20 minutes or more in a month to coordinate care. 4. Only one practitioner may furnish and bill the service in a calendar month. 5. The patient may stop CCM services at any time (effective at the end of the month) by phone call to the office staff.   Patient agreed to services and verbal consent obtained.   Follow up plan:   [name] Up[employer] Scheduler

## 2020-08-19 ENCOUNTER — Encounter: Payer: Self-pay | Admitting: Internal Medicine

## 2020-09-29 ENCOUNTER — Ambulatory Visit: Payer: Medicare Other

## 2020-10-15 ENCOUNTER — Other Ambulatory Visit: Payer: Self-pay

## 2020-10-15 ENCOUNTER — Ambulatory Visit: Payer: Medicare Other | Admitting: *Deleted

## 2020-10-15 VITALS — Ht 72.0 in | Wt 238.6 lb

## 2020-10-15 DIAGNOSIS — Z86010 Personal history of colonic polyps: Secondary | ICD-10-CM

## 2020-10-15 MED ORDER — SUPREP BOWEL PREP KIT 17.5-3.13-1.6 GM/177ML PO SOLN: 1.0000 | Freq: Once | ORAL | 0 refills | Status: AC

## 2020-10-15 NOTE — Progress Notes (Signed)
Completed covid vaccines x3  Pt is aware that care partner will wait in the car during procedure; if they feel like they will be too hot or cold to wait in the car; they may wait in the 4 th floor lobby. Patient is aware to bring only one care partner. We want them to wear a mask (we do not have any that we can provide them), practice social distancing, and we will check their temperatures when they get here.  I did remind the patient that their care partner needs to stay in the parking lot the entire time and have a cell phone available, we will call them when the pt is ready for discharge. Patient will wear mask into building.   No trouble with anesthesia, denies trouble neck, or hx/fam hx of malignant hyperthermia per pt   No egg or soy allergy  No home oxygen use   No medications for weight loss taken   Pt denies constipation issues

## 2020-10-16 ENCOUNTER — Telehealth: Payer: Self-pay

## 2020-10-16 NOTE — Progress Notes (Signed)
    Chronic Care Management Pharmacy Assistant   Name: Caleb Horton  MRN: [id number] DOB: [birth year]  Reason for Encounter: Medication Review/initial Visit     Caleb Horton,  75 y.o. , male presents for their Initial CCM visit with the clinical pharmacist In office.  PCP : Shelva Majestic, MD  Allergies:   Allergies  Allergen Reactions  . Losartan     Chest tightness- does ok on other arbs    Medications: Outpatient Encounter Medications as of 10/16/2020  Medication Sig  . atorvastatin (LIPITOR) 10 MG tablet TAKE 1 TABLET BY MOUTH  DAILY  . hydrochlorothiazide (HYDRODIURIL) 12.5 MG tablet TAKE 1 TABLET BY MOUTH  DAILY  . triamcinolone ointment (KENALOG) 0.5 % Apply 1 application topically 2 (two) times daily. Up to 7 days as needed for skin irritation such as poison ivy  . valsartan (DIOVAN) 80 MG tablet TAKE 2 TABLETS BY MOUTH  DAILY   No facility-administered encounter medications on file as of 10/16/2020.    Current Diagnosis: Patient Active Problem List   Diagnosis Date Noted  . Prostate cancer (HCC) 08/31/2017  . AAA (abdominal aortic aneurysm) (HCC) 03/17/2017  . Idiopathic neuropathy 02/07/2017  . Thrombocytopenia (HCC) 09/19/2015  . History of adenomatous polyp of colon 08/05/2015  . Gammopathy 03/20/2015  . Elevated blood protein 02/04/2015  . Hyperlipidemia 09/04/2014  . 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 Hyperglycemia 11/27/2007  . Sickle-cell trait (HCC) 11/27/2007  . Essential hypertension 11/27/2007  . BPH (benign prostatic hyperplasia) 11/27/2007     Have you seen any other providers since your last visit?    Patient stated he has not seen any other providers.  Any changes in your medications or health?     Patient stated No changes in medications or health.  Any side effects from any medications?     Patient stated no side effects from any medications or health.  Do you have an symptoms or problems not managed by your   medications?          Patient stated no symptoms  Any concerns about your health right now?          Patient stated no concerns about health at this time.  Has your provider asked that you check blood pressure, blood sugar, or follow special diet at home?         Patient stated he does not check blood pressure or blood sugar. Patient states he does not follow a diet however watches his sugar intake.  Do you get any type of exercise on a regular basis?         Patient states he works out at [employer] 5 days a week  Can you think of a goal you would like to reach for your health?        Patient would like to lose 30-40 pounds.  Do you have any problems getting your medications?         Patient stated no problems getting medications.  Is there anything that you would like to discuss during the appointment?        Patient stated not at this time,  Please bring medications and supplements to appointment   Follow-Up:  Pharmacist Review

## 2020-10-20 ENCOUNTER — Ambulatory Visit: Payer: Medicare Other

## 2020-10-20 ENCOUNTER — Other Ambulatory Visit: Payer: Self-pay

## 2020-10-20 DIAGNOSIS — E785 Hyperlipidemia, unspecified: Secondary | ICD-10-CM

## 2020-10-20 DIAGNOSIS — I10 Essential (primary) hypertension: Secondary | ICD-10-CM

## 2020-10-20 NOTE — Progress Notes (Signed)
Chronic Care Management Pharmacy Name: Caleb Horton     MRN: [id number]     DOB: 07/01/1946  Chief Complaint/ HPI Caleb Horton, 74 y.Horton., male, presents for their initial CCM visit with the clinical pharmacist in office.  PCP: Hunter, Stephen Horton, Horton Encounter Diagnoses  Name Primary?  . Hyperlipidemia, unspecified hyperlipidemia type Yes  . Essential hypertension    Office Visits:  05/06/2020 (PCP): was some concern with development of DM with a1c 6.6 with at hyome test - down to 5.8 in office. Lost 18 lbs from last visit, back to [employer]. CKD III. AAA without rupture - not noted on most recent screening - no f/u per radiology. LDL slightly above goal of <70 - work on lifestyle. Plan to change CKD III to CKD II due to sustained improvement in GFR.  Patient Active Problem List   Diagnosis Date Noted  . Prostate cancer (HCC) 08/31/2017  . AAA (abdominal aortic aneurysm) (HCC) 03/17/2017  . Idiopathic neuropathy 02/07/2017  . Thrombocytopenia (HCC) 09/19/2015  . History of adenomatous polyp of colon 08/05/2015  . Gammopathy 03/20/2015  . Elevated blood protein 02/04/2015  . Hyperlipidemia 09/04/2014  . 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 Hyperglycemia 11/27/2007  . Sickle-cell trait (HCC) 11/27/2007  . Essential hypertension 11/27/2007  . BPH (benign prostatic hyperplasia) 11/27/2007   Past Surgical History:  Procedure Laterality Date  . CIRCUMCISION     age 29  . COLONOSCOPY     Family History  Problem Relation Age of Onset  . Hypertension Father   . Heart disease Father   . Hearing loss Mother   . Heart disease Daughter   . Obesity Daughter   . Obesity Son   . Diabetes Maternal Grandmother   . Colon cancer Neg Hx   . Esophageal cancer Neg Hx   . Stomach cancer Neg Hx   . Rectal cancer Neg Hx    Social History   Social History Narrative   Married (3rd marriage). 5 children (3 from 1st, 2 from second), 7 grandkids.       Retired  telecommunications      Hobbies: [employer] (at [employer]) exercise class daily      Patient is left-handed. He lives with his wife in a 2 story house. He drinks one cup of coffee QOD, and occasionally drinks tea.   Allergies  Allergen Reactions  . Losartan     Chest tightness- does ok on other arbs   Outpatient Encounter Medications as of 10/20/2020  Medication Sig  . atorvastatin (LIPITOR) 10 MG tablet TAKE 1 TABLET BY MOUTH  DAILY  . hydrochlorothiazide (HYDRODIURIL) 12.5 MG tablet TAKE 1 TABLET BY MOUTH  DAILY  . triamcinolone ointment (KENALOG) 0.5 % Apply 1 application topically 2 (two) times daily. Up to 7 days as needed for skin irritation such as poison ivy  . valsartan (DIOVAN) 80 MG tablet TAKE 2 TABLETS BY MOUTH  DAILY   No facility-administered encounter medications on file as of 10/20/2020.   Patient Care Team    Relationship Specialty Notifications Start End  Hunter, Stephen Horton, Horton PCP - General Family Medicine  04/19/18   Christopher, Caleb Horton PCP - Cardiology Cardiology Admissions 04/27/18   Christopher, Caleb Horton Consulting Physician Cardiology  04/19/18   Potts, Jacob, RPH Pharmacist Pharmacist  08/11/20    Comment: [phone]   Current Diagnosis/Assessment: Goals Addressed            This Visit's Progress   .   PharmD Care Plan       CARE PLAN ENTRY (see longitudinal plan of care for additional care plan information)  Current Barriers:  . Chronic Disease Management support, education, and care coordination needs related to Hypertension and Hyperlipidemia   Hypertension BP Readings from Last 3 Encounters:  05/06/20 122/78  08/29/19 128/66  06/14/19 128/72   . Pharmacist Clinical Goal(s): Horton Over the next 365 days, patient will work with PharmD and providers to maintain BP goal <130/80 . Current regimen:  Horton Valsartan 160 mg once daily Horton Hydrochlorothiazide 12.5 mg once daily . Interventions: Horton Reviewed side effects - no issues  noted Horton Reviewed diet/exercise - Maintain a healthy weight and exercise regularly, as directed by your health care provider. Eat healthy foods, such as: Lean proteins, complex carbohydrates, fresh fruits and vegetables, low-fat dairy products, healthy fats. . Patient self care activities - Over the next 365 days, patient will: Horton Check BP at least once every 1-2 weeks, document, and provide at future appointments Horton Ensure daily salt intake < 2300 mg/day  Hyperlipidemia Lab Results  Component Value Date/Time   LDLCALC 82 05/06/2020 03:46 PM   LDLDIRECT 76.0 10/18/2018 10:54 AM   . Pharmacist Clinical Goal(s): Horton Over the next 365 days, patient will work with PharmD and providers to achieve LDL goal < 70 . Current regimen:  Horton Atorvastatin 10 mg once daily  . Interventions: Horton Reviewed diet/exercise - see hypertension . Patient self care activities - Over the next 365 days, patient will: Horton Continue current management  Medication management . Pharmacist Clinical Goal(s): Horton Over the next 365 days, patient will work with PharmD and providers to maintain optimal medication adherence . Current pharmacy: [facility] . Interventions Horton Comprehensive medication review performed. Horton Continue current medication management strategy . Patient self care activities - Over the next 365 days, patient will: Horton Take medications as prescribed Horton Report any questions or concerns to PharmD and/or provider(s) Initial goal documentation.      Hypertension   BP goal <130/80  BP Readings from Last 3 Encounters:  05/06/20 122/78  08/29/19 128/66  06/14/19 128/72    BMP Latest Ref Rng & Units 05/06/2020 08/29/2019 02/28/2019  Glucose 65 - 99 mg/dL 97 118(H) 115(H)  BUN 7 - 25 mg/dL 17 17 15  Creatinine 0.70 - 1.18 mg/dL 1.54(H) 1.22 1.20  BUN/Creat Ratio 6 - 22 (calc) 11 - -  Sodium 135 - 146 mmol/L 138 137 137  Potassium 3.5 - 5.3 mmol/L 3.6 3.7 3.8  Chloride 98 - 110 mmol/L 106 106 105  CO2 20  - 32 mmol/L 25 27 27  Calcium 8.6 - 10.3 mg/dL 10.3 10.2 10.1   Previous medications: losartan (chest tightness), benicar-hct, micardis-hct  Watching sugar intake, no other specific diet. Dinner - chicken primarily. Routine vegetables, minimal fruits. Goes to [employer] 5 days/week. Patient checks BP at home infrequently. Recent home readings: n/a.  Denies dizziness. Patient is currently at goal on the following medications:  . HCTZ 12.5 mg once daily . Valsartan 80 mg tablet - two tablets (160 mg) once daily  We discussed diet exercise - Maintain a healthy weight and exercise regularly, as directed by your health care provider. Eat healthy foods, such as: Lean proteins, complex carbohydrates, fresh fruits and vegetables, low-fat dairy products, healthy fats.  Plan  Continue current medications.  Prediabetes/hyperglycemia   A1c goal < 6.5%  Lab Results  Component Value Date/Time   HGBA1C 5.8 (H) 05/06/2020 03:46 PM     HGBA1C 6.6 02/01/2020 12:00 AM   HGBA1C 5.9 08/29/2019 10:29 AM   HGBA1C 5.5 02/22/2018 08:23 AM   HGBA1C 5.7 05/17/2014 11:06 AM   HGBA1C 5.7 09/17/2013 10:05 AM   MICROALBUR 0.8 05/17/2014 11:06 AM   MICRALBCREAT 0.4 05/17/2014 11:06 AM   GFR 70.61 08/29/2019 10:29 AM   GFR 72.08 02/28/2019 10:40 AM   GFR 72.49 10/18/2018 10:54 AM   GFR 59.85 (L) 02/22/2018 08:23 AM   GFR 61.45 02/02/2017 09:55 AM    Previous medications: n/a. Recent FBG readings: n/a. Patient is currently at goal on: . n/a  We discussed diet/exercise - see HTN.  Plan  Continue current medications.  Hyperlipidemia   LDL goal < 70  Lipid Panel     Component Value Date/Time   CHOL 146 05/06/2020 1546   TRIG 155 (H) 05/06/2020 1546   HDL 39 (L) 05/06/2020 1546   LDLCALC 82 05/06/2020 1546   LDLDIRECT 76.0 10/18/2018 1054    Hepatic Function Latest Ref Rng & Units 05/06/2020 08/29/2019 02/28/2019  Total Protein 6.1 - 8.1 g/dL 8.6(H) 9.1(H) 8.9(H)  Albumin 3.5 - 5.2 g/dL - 4.0 3.9  AST  10 - 35 U/L _0 ALT 9 - 46 U/L _1 Alk Phosphatase 39 - 117 U/L - 76 74  Total Bilirubin 0.2 - 1.2 mg/dL 0.6 0.5 0.5  Bilirubin, Direct 0.0 - 0.3 mg/dL - - -    The 10-year ASCVD risk score Mikey Bussing DC Jr., et al., 2013) is: 18.9%   Values used to calculate the score:     Age: 22 years     Sex: Male     Is Non-Hispanic African American: Yes     Diabetic: No     Tobacco smoker: No     Systolic Blood Pressure: 154 mmHg     Is BP treated: Yes     HDL Cholesterol: 39 mg/dL     Total Cholesterol: 146 mg/dL   Previous medications: none noted. Atorvastatin started 01/2016.  Attempting lifestyle changes to bring LDL to goal.  Denies any issues with side effects/tolerability. Patient is currently not at goal the following medications:  . Atorvastatin 10 mg once daily  Reviewed potential side effects/tolerability - no issues noted. Discussed cholesterol goals - attempting to get to LDL goal <70 through diet/exercise.  Plan  Continue current medications. Consider atorvastatin dose increase to 20 mg if not at goal at f/u.  Vaccines   Immunization History  Administered Date(s) Administered  . Fluad Quad(high Dose 65+) 06/14/2019, 09/15/2020  . Influenza Split 08/15/2012  . Influenza, High Dose Seasonal PF 07/14/2013, 07/05/2014  . Influenza-Unspecified 07/20/2015, 07/26/2018  . PFIZER SARS-COV-2 Vaccination 12/04/2019, 12/25/2019, 07/15/2020  . Pneumococcal Conjugate-13 07/05/2014  . Pneumococcal Polysaccharide-23 02/12/2013  . Td 10/11/2005  . Tdap 03/17/2018  . Zoster 02/04/2015   Reviewed and discussed patient's vaccination history.   09/15/2020 - HD flu shot received - confirmed with [facility].  Plan  Recommended patient receive shingrix at pharmacy.   Medication Management / Care Coordination   Receives prescription medications from:  [facility] [address], [place] [address] Phone:  [phone] Fax: [place], [place], [address] [place], [place] [address] Phone: [phone] Fax: [place], [place]  [address] [place] [address] Phone: [phone] Fax: [phone]   Denies any issues with current medication management.   Plan  Continue current medication management strategy. ___________________________ SDOH (Social Determinants of Health) assessments performed: Yes. Future Appointments  Date Time Provider Department Center  10/29/2020  1:30 PM Pyrtle, Jay M, Horton [facility]  11/13/2020  9:40 AM Hunter, Stephen Horton, Horton [facility]  07/16/2021 11:45 AM [facility] HEALTH COACH [facility]  08/18/2021  1:30 PM [facility] CCM PHARMACIST [facility]   Visit follow-up:  . CPA follow-up: 5 month gen/adh. . RPH follow-up: 10 month f/u telephone visit.  Jacob Potts, Pharm.D., BCGP Clinical Pharmacist [facility] [phone] 

## 2020-10-20 NOTE — Patient Instructions (Addendum)
Caleb Horton,  Thank you for taking the time to review your medications with me today.  I have included our care plan/goals in the following pages. Please review and call me at [phone] with any questions!  Thanks! Ellin Mayhew, Pharm.D., BCGP Clinical Pharmacist [facility] [phone]  Goals Addressed            This Visit's Progress   . PharmD Care Plan       CARE PLAN ENTRY (see longitudinal plan of care for additional care plan information)  Current Barriers:  . Chronic Disease Management support, education, and care coordination needs related to Hypertension and Hyperlipidemia   Hypertension BP Readings from Last 3 Encounters:  05/06/20 122/78  08/29/19 128/66  06/14/19 128/72   . Pharmacist Clinical Goal(s): o Over the next 365 days, patient will work with PharmD and providers to maintain BP goal <130/80 . Current regimen:  o Valsartan 160 mg once daily o Hydrochlorothiazide 12.5 mg once daily . Interventions: o Reviewed side effects - no issues noted o Reviewed diet/exercise - Maintain a healthy weight and exercise regularly, as directed by your health care provider. Eat healthy foods, such as: Lean proteins, complex carbohydrates, fresh fruits and vegetables, low-fat dairy products, healthy fats. . Patient self care activities - Over the next 365 days, patient will: o Check BP at least once every 1-2 weeks, document, and provide at future appointments o Ensure daily salt intake < 2300 mg/day  Hyperlipidemia Lab Results  Component Value Date/Time   LDLCALC 82 05/06/2020 03:46 PM   LDLDIRECT 76.0 10/18/2018 10:54 AM   . Pharmacist Clinical Goal(s): o Over the next 365 days, patient will work with PharmD and providers to achieve LDL goal < 70 . Current regimen:  o Atorvastatin 10 mg once daily  . Interventions: o Reviewed diet/exercise - see hypertension . Patient self care activities - Over the next 365 days,  patient will: o Continue current management  Medication management . Pharmacist Clinical Goal(s): o Over the next 365 days, patient will work with PharmD and providers to maintain optimal medication adherence . Current pharmacy: [facility] . Interventions o Comprehensive medication review performed. o Continue current medication management strategy . Patient self care activities - Over the next 365 days, patient will: o Take medications as prescribed o Report any questions or concerns to PharmD and/or provider(s) Initial goal documentation.      The patient verbalized understanding of instructions provided today and agreed to receive a mailed copy of patient instruction and/or educational materials. Telephone follow up appointment with pharmacy team member scheduled for: See next appointment with "Care Management Staff" under "What's Next" below.   Caleb Horton was given information about Chronic Care Management services today including:  1. CCM service includes personalized support from designated clinical staff supervised by his physician, including individualized plan of care and coordination with other care providers 2. 24/7 contact phone numbers for assistance for urgent and routine care needs. 3. Standard insurance, coinsurance, copays and deductibles apply for chronic care management only during months in which we provide at least 20 minutes of these services. Most insurances cover these services at 100%, however patients may be responsible for any copay, coinsurance and/or deductible if applicable. This service may help you avoid the need for more expensive face-to-face services. 4. Only one practitioner may furnish and bill the service in a calendar month. 5. The patient may stop CCM services at  any time (effective at the end of the month) by phone call to the office staff.  Patient agreed to services and verbal consent obtained.   The patient verbalized understanding of  instructions provided today and agreed to receive a mailed copy of patient instruction and/or educational materials. Telephone follow up appointment with pharmacy team member scheduled for: See next appointment with "Care Management Staff" under "What's Next" below.   Hypertension, Adult High blood pressure (hypertension) is when the force of blood pumping through the arteries is too strong. The arteries are the blood vessels that carry blood from the heart throughout the body. Hypertension forces the heart to work harder to pump blood and may cause arteries to become narrow or stiff. Untreated or uncontrolled hypertension can cause a heart attack, heart failure, a stroke, kidney disease, and other problems. A blood pressure reading consists of a higher number over a lower number. Ideally, your blood pressure should be below 120/80. The first ("top") number is called the systolic pressure. It is a measure of the pressure in your arteries as your heart beats. The second ("bottom") number is called the diastolic pressure. It is a measure of the pressure in your arteries as the heart relaxes. What are the causes? The exact cause of this condition is not known. There are some conditions that result in or are related to high blood pressure. What increases the risk? Some risk factors for high blood pressure are under your control. The following factors may make you more likely to develop this condition:  Smoking.  Having type 2 diabetes mellitus, high cholesterol, or both.  Not getting enough exercise or physical activity.  Being overweight.  Having too much fat, sugar, calories, or salt (sodium) in your diet.  Drinking too much alcohol. Some risk factors for high blood pressure may be difficult or impossible to change. Some of these factors include:  Having chronic kidney disease.  Having a family history of high blood pressure.  Age. Risk increases with age.  Race. You may be at higher risk  if you are African American.  Gender. Men are at higher risk than women before age 68. After age 63, women are at higher risk than men.  Having obstructive sleep apnea.  Stress. What are the signs or symptoms? High blood pressure may not cause symptoms. Very high blood pressure (hypertensive crisis) may cause:  Headache.  Anxiety.  Shortness of breath.  Nosebleed.  Nausea and vomiting.  Vision changes.  Severe chest pain.  Seizures. How is this diagnosed? This condition is diagnosed by measuring your blood pressure while you are seated, with your arm resting on a flat surface, your legs uncrossed, and your feet flat on the floor. The cuff of the blood pressure monitor will be placed directly against the skin of your upper arm at the level of your heart. It should be measured at least twice using the same arm. Certain conditions can cause a difference in blood pressure between your right and left arms. Certain factors can cause blood pressure readings to be lower or higher than normal for a short period of time:  When your blood pressure is higher when you are in a health care provider's office than when you are at home, this is called white coat hypertension. Most people with this condition do not need medicines.  When your blood pressure is higher at home than when you are in a health care provider's office, this is called masked hypertension. Most people with  this condition may need medicines to control blood pressure. If you have a high blood pressure reading during one visit or you have normal blood pressure with other risk factors, you may be asked to:  Return on a different day to have your blood pressure checked again.  Monitor your blood pressure at home for 1 week or longer. If you are diagnosed with hypertension, you may have other blood or imaging tests to help your health care provider understand your overall risk for other conditions. How is this treated? This  condition is treated by making healthy lifestyle changes, such as eating healthy foods, exercising more, and reducing your alcohol intake. Your health care provider may prescribe medicine if lifestyle changes are not enough to get your blood pressure under control, and if:  Your systolic blood pressure is above 130.  Your diastolic blood pressure is above 80. Your personal target blood pressure may vary depending on your medical conditions, your age, and other factors. Follow these instructions at home: Eating and drinking  Eat a diet that is high in fiber and potassium, and low in sodium, added sugar, and fat. An example eating plan is called the DASH (Dietary Approaches to Stop Hypertension) diet. To eat this way: ? Eat plenty of fresh fruits and vegetables. Try to fill one half of your plate at each meal with fruits and vegetables. ? Eat whole grains, such as whole-wheat pasta, brown rice, or whole-grain bread. Fill about one fourth of your plate with whole grains. ? Eat or drink low-fat dairy products, such as skim milk or low-fat yogurt. ? Avoid fatty cuts of meat, processed or cured meats, and poultry with skin. Fill about one fourth of your plate with lean proteins, such as fish, chicken without skin, beans, eggs, or tofu. ? Avoid pre-made and processed foods. These tend to be higher in sodium, added sugar, and fat.  Reduce your daily sodium intake. Most people with hypertension should eat less than 1,500 mg of sodium a day.  Do not drink alcohol if: ? Your health care provider tells you not to drink. ? You are pregnant, may be pregnant, or are planning to become pregnant.  If you drink alcohol: ? Limit how much you use to:  0-1 drink a day for women.  0-2 drinks a day for men. ? Be aware of how much alcohol is in your drink. In the [place] one drink equals one 12 oz bottle of beer (355 mL), one 5 oz glass of wine (148 mL), or one 1 oz glass of hard liquor (44 mL).    Lifestyle  Work with your health care provider to maintain a healthy body weight or to lose weight. Ask what an ideal weight is for you.  Get at least 30 minutes of exercise most days of the week. Activities may include walking, swimming, or biking.  Include exercise to strengthen your muscles (resistance exercise), such as Pilates or lifting weights, as part of your weekly exercise routine. Try to do these types of exercises for 30 minutes at least 3 days a week.  Do not use any products that contain nicotine or tobacco, such as cigarettes, e-cigarettes, and chewing tobacco. If you need help quitting, ask your health care provider.  Monitor your blood pressure at home as told by your health care provider.  Keep all follow-up visits as told by your health care provider. This is important.   Medicines  Take over-the-counter and prescription medicines only as told by your  health care provider. Follow directions carefully. Blood pressure medicines must be taken as prescribed.  Do not skip doses of blood pressure medicine. Doing this puts you at risk for problems and can make the medicine less effective.  Ask your health care provider about side effects or reactions to medicines that you should watch for. Contact a health care provider if you:  Think you are having a reaction to a medicine you are taking.  Have headaches that keep coming back (recurring).  Feel dizzy.  Have swelling in your ankles.  Have trouble with your vision. Get help right away if you:  Develop a severe headache or confusion.  Have unusual weakness or numbness.  Feel faint.  Have severe pain in your chest or abdomen.  Vomit repeatedly.  Have trouble breathing. Summary  Hypertension is when the force of blood pumping through your arteries is too strong. If this condition is not controlled, it may put you at risk for serious complications.  Your personal target blood pressure may vary depending on  your medical conditions, your age, and other factors. For most people, a normal blood pressure is less than 120/80.  Hypertension is treated with lifestyle changes, medicines, or a combination of both. Lifestyle changes include losing weight, eating a healthy, low-sodium diet, exercising more, and limiting alcohol. This information is not intended to replace advice given to you by your health care provider. Make sure you discuss any questions you have with your health care provider. Document Revised: 06/07/2018 Document Reviewed: 06/07/2018 Elsevier Patient Education  2021 [employer].   Exercising to Lose Weight Exercise is structured, repetitive physical activity to improve fitness and health. Getting regular exercise is important for everyone. It is especially important if you are overweight. Being overweight increases your risk of heart disease, stroke, diabetes, high blood pressure, and several types of cancer. Reducing your calorie intake and exercising can help you lose weight. Exercise is usually categorized as moderate or vigorous intensity. To lose weight, most people need to do a certain amount of moderate-intensity or vigorous-intensity exercise each week. Moderate-intensity exercise Moderate-intensity exercise is any activity that gets you moving enough to burn at least three times more energy (calories) than if you were sitting. Examples of moderate exercise include:  Walking a mile in 15 minutes.  Doing light yard work.  Biking at an easy pace. Most people should get at least 150 minutes (2 hours and 30 minutes) a week of moderate-intensity exercise to maintain their body weight.   Vigorous-intensity exercise Vigorous-intensity exercise is any activity that gets you moving enough to burn at least six times more calories than if you were sitting. When you exercise at this intensity, you should be working hard enough that you are not able to carry on a conversation. Examples of  vigorous exercise include:  Running.  Playing a team sport, such as football, basketball, and soccer.  Jumping rope. Most people should get at least 75 minutes (1 hour and 15 minutes) a week of vigorous-intensity exercise to maintain their body weight. How can exercise affect me? When you exercise enough to burn more calories than you eat, you lose weight. Exercise also reduces body fat and builds muscle. The more muscle you have, the more calories you burn. Exercise also:  Improves mood.  Reduces stress and tension.  Improves your overall fitness, flexibility, and endurance.  Increases bone strength. The amount of exercise you need to lose weight depends on:  Your age.  The type of exercise.  Any health conditions you have.  Your overall physical ability. Talk to your health care provider about how much exercise you need and what types of activities are safe for you. What actions can I take to lose weight? Nutrition  Make changes to your diet as told by your health care provider or diet and nutrition specialist (dietitian). This may include: ? Eating fewer calories. ? Eating more protein. ? Eating less unhealthy fats. ? Eating a diet that includes fresh fruits and vegetables, whole grains, low-fat dairy products, and lean protein. ? Avoiding foods with added fat, salt, and sugar.  Drink plenty of water while you exercise to prevent dehydration or heat stroke.   Activity  Choose an activity that you enjoy and set realistic goals. Your health care provider can help you make an exercise plan that works for you.  Exercise at a moderate or vigorous intensity most days of the week. ? The intensity of exercise may vary from person to person. You can tell how intense a workout is for you by paying attention to your breathing and heartbeat. Most people will notice their breathing and heartbeat get faster with more intense exercise.  Do resistance training twice each week, such  as: ? Push-ups. ? Sit-ups. ? Lifting weights. ? Using resistance bands.  Getting short amounts of exercise can be just as helpful as long structured periods of exercise. If you have trouble finding time to exercise, try to include exercise in your daily routine. ? Get up, stretch, and walk around every 30 minutes throughout the day. ? Go for a walk during your lunch break. ? Park your car farther away from your destination. ? If you take public transportation, get off one stop early and walk the rest of the way. ? Make phone calls while standing up and walking around. ? Take the stairs instead of elevators or escalators.  Wear comfortable clothes and shoes with good support.  Do not exercise so much that you hurt yourself, feel dizzy, or get very short of breath. Where to find more information  [place] Department of Health and Human Services: [employer]at  Centers for Disease Control and Prevention (CDC): http://www.wolf.info/ Contact a health care provider:  Before starting a new exercise program.  If you have questions or concerns about your weight.  If you have a medical problem that keeps you from exercising. Get help right away if you have any of the following while exercising:  Injury.  Dizziness.  Difficulty breathing or shortness of breath that does not go away when you stop exercising.  Chest pain.  Rapid heartbeat. Summary  Being overweight increases your risk of heart disease, stroke, diabetes, high blood pressure, and several types of cancer.  Losing weight happens when you burn more calories than you eat.  Reducing the amount of calories you eat in addition to getting regular moderate or vigorous exercise each week helps you lose weight. This information is not intended to replace advice given to you by your health care provider. Make sure you discuss any questions you have with your health care provider. Document Revised: 01/24/2020 Document Reviewed:  01/24/2020 Elsevier Patient Education  2021 [employer].

## 2020-10-22 ENCOUNTER — Encounter: Payer: Self-pay | Admitting: Internal Medicine

## 2020-10-29 ENCOUNTER — Encounter: Payer: Self-pay | Admitting: Internal Medicine

## 2020-10-29 ENCOUNTER — Other Ambulatory Visit: Payer: Self-pay

## 2020-10-29 ENCOUNTER — Ambulatory Visit (AMBULATORY_SURGERY_CENTER): Payer: Medicare Other | Admitting: Internal Medicine

## 2020-10-29 VITALS — BP 116/69 | HR 60 | Temp 97.8°F | Resp 13 | Ht 72.0 in | Wt 238.0 lb

## 2020-10-29 DIAGNOSIS — D122 Benign neoplasm of ascending colon: Secondary | ICD-10-CM

## 2020-10-29 DIAGNOSIS — Z86010 Personal history of colonic polyps: Secondary | ICD-10-CM | POA: Diagnosis not present

## 2020-10-29 MED ORDER — SODIUM CHLORIDE 0.9 % IV SOLN: 500.0000 mL | Freq: Once | INTRAVENOUS | Status: DC

## 2020-10-29 NOTE — Op Note (Signed)
[place] Patient Name: Caleb Horton Procedure Date: 10/29/2020 1:32 PM MRN: [id number] Endoscopist: Jerene Bears , MD Age: 75 Referring MD:  Date of Birth: [birth year] Gender: Male Account #: [id number] Procedure:                Colonoscopy Indications:              High risk colon cancer surveillance: Personal                            history of adenoma with villous component, Last                            colonoscopy: September 2016 Medicines:                Monitored Anesthesia Care Procedure:                Pre-Anesthesia Assessment:                           - Prior to the procedure, a History and Physical                            was performed, and patient medications and                            allergies were reviewed. The patient's tolerance of                            previous anesthesia was also reviewed. The risks                            and benefits of the procedure and the sedation                            options and risks were discussed with the patient.                            All questions were answered, and informed consent                            was obtained. Prior Anticoagulants: The patient has                            taken no previous anticoagulant or antiplatelet                            agents. ASA Grade Assessment: III - A patient with                            severe systemic disease. After reviewing the risks                            and benefits, the patient was deemed in  satisfactory condition to undergo the procedure.                           After obtaining informed consent, the colonoscope                            was passed under direct vision. Throughout the                            procedure, the patient's blood pressure, pulse, and                            oxygen saturations were monitored continuously. The                            Olympus [id number]) [id number] was  introduced                            through the anus and advanced to the cecum,                            identified by appendiceal orifice and ileocecal                            valve. The colonoscopy was performed without                            difficulty. The patient tolerated the procedure                            well. The quality of the bowel preparation was                            good. The ileocecal valve, appendiceal orifice, and                            rectum were photographed. Scope In: 1:36:19 PM Scope Out: 1:53:06 PM Scope Withdrawal Time: 0 hours 12 minutes 36 seconds  Total Procedure Duration: 0 hours 16 minutes 47 seconds  Findings:                 Skin tags were found on perianal exam.                           A 4 mm polyp was found in the ascending colon. The                            polyp was sessile. The polyp was removed with a                            cold snare. Resection and retrieval were complete.                           A few medium-mouthed diverticula were found in the  sigmoid colon.                           External and internal hemorrhoids were found during                            retroflexion and during perianal exam. Complications:            No immediate complications. Estimated Blood Loss:     Estimated blood loss was minimal. Impression:               - One 4 mm polyp in the ascending colon, removed                            with a cold snare. Resected and retrieved.                           - Diverticulosis in the sigmoid colon.                           - External and internal hemorrhoids. Recommendation:           - Patient has a contact number available for                            emergencies. The signs and symptoms of potential                            delayed complications were discussed with the                            patient. Return to normal activities tomorrow.                             Written discharge instructions were provided to the                            patient.                           - Resume previous diet.                           - Continue present medications.                           - Await pathology results.                           - Repeat colonoscopy may be recommended. The                            colonoscopy date will be determined after pathology                            results from today's exam become available for  review. Jerene Bears, MD 10/29/2020 1:57:34 PM This report has been signed electronically.

## 2020-10-29 NOTE — Patient Instructions (Signed)
Handout on polyps and diverticulosis given. ° °YOU HAD AN ENDOSCOPIC PROCEDURE TODAY AT THE [facility]:   Refer to the procedure report that was given to you for any specific questions about what was found during the examination.  If the procedure report does not answer your questions, please call your gastroenterologist to clarify.  If you requested that your care partner not be given the details of your procedure findings, then the procedure report has been included in a sealed envelope for you to review at your convenience later. ° °YOU SHOULD EXPECT: Some feelings of bloating in the abdomen. Passage of more gas than usual.  Walking can help get rid of the air that was put into your GI tract during the procedure and reduce the bloating. If you had a lower endoscopy (such as a colonoscopy or flexible sigmoidoscopy) you may notice spotting of blood in your stool or on the toilet paper. If you underwent a bowel prep for your procedure, you may not have a normal bowel movement for a few days. ° °Please Note:  You might notice some irritation and congestion in your nose or some drainage.  This is from the oxygen used during your procedure.  There is no need for concern and it should clear up in a day or so. ° °SYMPTOMS TO REPORT IMMEDIATELY: ° °Following lower endoscopy (colonoscopy or flexible sigmoidoscopy): ° Excessive amounts of blood in the stool ° Significant tenderness or worsening of abdominal pains ° Swelling of the abdomen that is new, acute ° Fever of 100°F or higher ° ° Black, tarry-looking stools ° °For urgent or emergent issues, a gastroenterologist can be reached at any hour by calling [phone]. °Do not use MyChart messaging for urgent concerns.  ° ° °DIET:  We do recommend a small meal at first, but then you may proceed to your regular diet.  Drink plenty of fluids but you should avoid alcoholic beverages for 24 hours. ° °ACTIVITY:  You should plan to take it easy for the rest of  today and you should NOT DRIVE or use heavy machinery until tomorrow (because of the sedation medicines used during the test).   ° °FOLLOW UP: °Our staff will call the number listed on your records 48-72 hours following your procedure to check on you and address any questions or concerns that you may have regarding the information given to you following your procedure. If we do not reach you, we will leave a message.  We will attempt to reach you two times.  During this call, we will ask if you have developed any symptoms of COVID 19. If you develop any symptoms (ie: fever, flu-like symptoms, shortness of breath, cough etc.) before then, please call [phone].  If you test positive for Covid 19 in the 2 weeks post procedure, please call and report this information to us.   ° °If any biopsies were taken you will be contacted by phone or by letter within the next 1-3 weeks.  Please call us at [phone] if you have not heard about the biopsies in 3 weeks.  ° ° °SIGNATURES/CONFIDENTIALITY: °You and/or your care partner have signed paperwork which will be entered into your electronic medical record.  These signatures attest to the fact that that the information above on your After Visit Summary has been reviewed and is understood.  Full responsibility of the confidentiality of this discharge information lies with you and/or your care-partner.  °

## 2020-10-29 NOTE — Progress Notes (Signed)
pt tolerated well. VSS. awake and to recovery. Report given to RN.  

## 2020-10-29 NOTE — Progress Notes (Signed)
Called to room to assist during endoscopic procedure.  Patient ID and intended procedure confirmed with present staff. Received instructions for my participation in the procedure from the performing physician.  

## 2020-10-31 ENCOUNTER — Telehealth: Payer: Self-pay

## 2020-10-31 ENCOUNTER — Telehealth: Payer: Self-pay | Admitting: *Deleted

## 2020-10-31 NOTE — Telephone Encounter (Signed)
NO ANSWER, MESSAGE LEFT FOR PATIENT. 

## 2020-10-31 NOTE — Telephone Encounter (Signed)
  Follow up Call-  Call back number 10/29/2020  Post procedure Call Back phone  # [phone]  Permission to leave phone message Yes  Some recent data might be hidden     Patient questions:  Do you have a fever, pain , or abdominal swelling? No. Pain Score  0 *  Have you tolerated food without any problems? Yes.    Have you been able to return to your normal activities? Yes.    Do you have any questions about your discharge instructions: Diet   No. Medications  No. Follow up visit  No.  Do you have questions or concerns about your Care? No.  Actions: * If pain score is 4 or above: No action needed, pain <4.  1. Have you developed a fever since your procedure? no  2.   Have you had an respiratory symptoms (SOB or cough) since your procedure? no  3.   Have you tested positive for COVID 19 since your procedure no  4.   Have you had any family members/close contacts diagnosed with the COVID 19 since your procedure?  no   If yes to any of these questions please route to Joylene John, RN and Joella Prince, RN

## 2020-11-06 ENCOUNTER — Encounter: Payer: Self-pay | Admitting: Internal Medicine

## 2020-11-12 NOTE — Progress Notes (Signed)
Phone: [phone]   Subjective:  Patient presents today for their annual physical. Chief complaint-noted.   See problem oriented charting- ROS- full  review of systems was completed and negative  Per full ROS sheet The following were reviewed and entered/updated in epic: Past Medical History:  Diagnosis Date   Anemia    Hypertension    INGROWN NAIL 11/10/2010   Annotation: bilateral  Qualifier: Diagnosis of  By: Arnoldo Morale MD, John E    Sickle cell trait [facility])    Patient Active Problem List   Diagnosis Date Noted   Prostate cancer ([place]) 08/31/2017    Priority: High   Gammopathy 03/20/2015    Priority: High   AAA (abdominal aortic aneurysm) ([place]) 03/17/2017    Priority: Medium   Idiopathic neuropathy 02/07/2017    Priority: Medium   Hyperlipidemia 09/04/2014    Priority: Medium   CKD (chronic kidney disease), 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    Priority: Medium   Essential hypertension 11/27/2007    Priority: Medium   History of adenomatous polyp of colon 08/05/2015    Priority: Low   Elevated blood protein 02/04/2015    Priority: Low   Hyperglycemia 11/27/2007    Priority: Low   Sickle-cell trait ([place]) 11/27/2007    Priority: Low   BPH (benign prostatic hyperplasia) 11/27/2007    Priority: Low   Thrombocytopenia ([place]) 09/19/2015   Past Surgical History:  Procedure Laterality Date   CIRCUMCISION     age 31   COLONOSCOPY      Family History  Problem Relation Age of Onset   Hypertension Father    Heart disease Father    Hearing loss Mother    Heart disease Daughter    Obesity Daughter    Obesity Son    Diabetes Maternal Grandmother    Colon cancer Neg Hx    Esophageal cancer Neg Hx    Stomach cancer Neg Hx    Rectal cancer Neg Hx     Medications- reviewed and updated Current Outpatient Medications  Medication Sig Dispense Refill   atorvastatin (LIPITOR) 10 MG tablet TAKE 1 TABLET BY MOUTH  DAILY 90 tablet 3   hydrochlorothiazide  (HYDRODIURIL) 12.5 MG tablet TAKE 1 TABLET BY MOUTH  DAILY 90 tablet 3   Omega-3 Fatty Acids (FISH OIL) 1000 MG CAPS Take by mouth.     triamcinolone ointment (KENALOG) 0.5 % Apply 1 application topically 2 (two) times daily. Up to 7 days as needed for skin irritation such as poison ivy 60 g 1   valsartan (DIOVAN) 80 MG tablet TAKE 2 TABLETS BY MOUTH  DAILY 180 tablet 3   No current facility-administered medications for this visit.    Allergies-reviewed and updated No Known Allergies  Social History   Social History Narrative   Married (3rd marriage). 5 children (3 from 1st, 2 from second), 7 grandkids.       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 He lives with his wife in a 2 story house. He drinks one cup of coffee QOD, and occasionally drinks tea.   Objective  Objective:  BP 124/74    Pulse 85    Temp (!) 97.2 F (36.2 C) (Temporal)    Ht 6' (1.829 m)    Wt 235 lb (106.6 kg)    SpO2 94%    BMI 31.87 kg/m  Gen: NAD, resting comfortably HEENT: Mucous membranes are moist.  Oropharynx normal. Some plaque on bottom teeth- advised to see dentist Neck: no thyromegaly CV: RRR no murmurs rubs or gallops Lungs: CTAB no crackles, wheeze, rhonchi Abdomen: soft/nontender/nondistended/normal bowel sounds. No rebound or guarding.  Ext: no edema Skin: warm, dry Neuro: grossly normal, moves all extremities, PERRLA   Assessment and Plan  75 y.o. male presenting for annual physical.  Health Maintenance counseling: 1. Anticipatory guidance: Patient counseled regarding regular dental exams -q6 months (he is behind), eye exams -yearly,  avoiding smoking and second hand smoke , limiting alcohol to 2 beverages per day , no drugs.   2. Risk factor reduction:  Advised patient of need for regular exercise and diet rich and fruits and vegetables to reduce risk of heart attack and stroke. Exercise- going to [facility]  instead of [employer] now. Diet-he has cut down on snaking particularly unhealthy choices and that has helped in addition to being active. Down from 259 at last physical in may 2020 Wt Readings from Last 3 Encounters:  11/13/20 235 lb (106.6 kg)  10/29/20 238 lb (108 kg)  10/15/20 238 lb 9.6 oz (108.2 kg)  3. Immunizations/screenings/ancillary studies- discussed shingrix at pharmacy Immunization History  Administered Date(s) Administered   Fluad Quad(high Dose 65+) 06/14/2019, 09/15/2020   Influenza Split 08/15/2012   Influenza, High Dose Seasonal PF 07/14/2013, 07/05/2014   Influenza-Unspecified 07/20/2015, 07/26/2018   PFIZER(Purple Top)SARS-COV-2 Vaccination 12/04/2019, 12/25/2019, 07/15/2020   Pneumococcal Conjugate-13 07/05/2014   Pneumococcal Polysaccharide-23 02/12/2013   Td 10/11/2005   Tdap 03/17/2018   Zoster 02/04/2015  4. Prostate cancer screening-  #prostate cancer- under active surveillance  With [facility]- was told 1 year follow up from 04/08/20 Lab Results  Component Value Date   PSA 4.97 04/03/2020   PSA 3.78 08/29/2019   PSA 5.24 (H) 02/24/2018   5. Colon cancer screening - History of adenomatous colon polyps October 29, 2020 with 5-year repeat  6. Skin cancer screening- lower risk due to melanin content. advised regular sunscreen use. Denies worrisome, changing, or new skin lesions.  7. former smoker- quit in 1970s- no regular follow up needed- other than AAA screen discussed below 8. STD screening - only active with wife so not needed  Status of chronic or acute concerns   #gammopathy/thrombocytopenia - released from hematology in 2017. We continue to monitor CBC with diff. Also with thrombocytopenia - monitor with labs today  #hyperlipidemia S: Medication: Atorvastatin 10mg   Lab Results  Component Value Date   CHOL 146 05/06/2020   HDL 39 (L) 05/06/2020   LDLCALC 82 05/06/2020   LDLDIRECT 76.0 10/18/2018   TRIG 155 (H) 05/06/2020   CHOLHDL 3.7  05/06/2020  A/P: will check direct LDL with prediabetes unlikely to increase dose unless LDL over 100  #hypertension S: medication: HCTZ 12.5Mg , valsartan 80Mg  A/P: looks better on repeat- continue current medicine  # Hyperglycemia/insulin resistance/prediabetes- peak a1c 6.6 x1 with at home test, otherwise lower than that S:  Medication:  none Exercise and diet- aerobics at [facility]  A/P: hopefully stable- update a1c  #AAA screening- noted on initial screen but not on repeat- have contacted radiologist in past and they stated no further follow up needed  #sickle cell trait - no signs of issues- continue to monitor  Recommended follow up: Return in about 6 months (around 05/13/2021) for follow up- or sooner if needed. Future Appointments  Date Time Provider [place]  07/16/2021 11:45 AM [facility] HEALTH COACH [facility]  08/18/2021  1:30 PM [facility] CCM PHARMACIST [facility]  Lab/Order associations: fasting   ICD-10-CM   1. Preventative health care  Z00.00 CBC with Differential/Platelet    Comprehensive metabolic panel    Hemoglobin A1c    LDL cholesterol, direct  2. Essential hypertension  I10 CBC with Differential/Platelet    Comprehensive metabolic panel    LDL cholesterol, direct  3. Hyperglycemia  R73.9 Hemoglobin A1c  4. Hyperlipidemia, unspecified hyperlipidemia type  E78.5 CBC with Differential/Platelet    Comprehensive metabolic panel    LDL cholesterol, direct    No orders of the defined types were placed in this encounter.   Return precautions advised.  Garret Reddish, MD

## 2020-11-12 NOTE — Patient Instructions (Addendum)
Please stop by lab before you go If you have mychart- we will send your results within 3 business days of [place] receiving them.  If you do not have mychart- we will call you about results within 5 business days of [place] receiving them.  *please also note that you will see labs on mychart as soon as they post. I will later go in and write notes on them- will say "notes from Dr. Yong Channel"  Please check with your pharmacy to see if they have the shingrix vaccine. If they do- please get this immunization and update [place] by phone call or mychart with dates you receive the vaccine  Schedule dental follow up   Recommended follow up: Return in about 6 months (around 05/13/2021) for follow up- or sooner if needed.

## 2020-11-13 ENCOUNTER — Encounter: Payer: Self-pay | Admitting: Family Medicine

## 2020-11-13 ENCOUNTER — Ambulatory Visit (INDEPENDENT_AMBULATORY_CARE_PROVIDER_SITE_OTHER): Payer: Medicare Other | Admitting: Family Medicine

## 2020-11-13 ENCOUNTER — Other Ambulatory Visit: Payer: Self-pay

## 2020-11-13 VITALS — BP 124/74 | HR 85 | Temp 97.2°F | Ht 72.0 in | Wt 235.0 lb

## 2020-11-13 DIAGNOSIS — I10 Essential (primary) hypertension: Secondary | ICD-10-CM

## 2020-11-13 DIAGNOSIS — Z0000 Encounter for general adult medical examination without abnormal findings: Secondary | ICD-10-CM | POA: Diagnosis not present

## 2020-11-13 DIAGNOSIS — E785 Hyperlipidemia, unspecified: Secondary | ICD-10-CM

## 2020-11-13 DIAGNOSIS — R739 Hyperglycemia, unspecified: Secondary | ICD-10-CM | POA: Diagnosis not present

## 2020-11-13 LAB — CBC WITH DIFFERENTIAL/PLATELET
Basophils Absolute: 0 10*3/uL (ref 0.0–0.1)
Basophils Relative: 0.4 % (ref 0.0–3.0)
Eosinophils Absolute: 0.2 10*3/uL (ref 0.0–0.7)
Eosinophils Relative: 3.4 % (ref 0.0–5.0)
HCT: 38 % — ABNORMAL LOW (ref 39.0–52.0)
Hemoglobin: 12.8 g/dL — ABNORMAL LOW (ref 13.0–17.0)
Lymphocytes Relative: 30.8 % (ref 12.0–46.0)
Lymphs Abs: 1.8 10*3/uL (ref 0.7–4.0)
MCHC: 33.6 g/dL (ref 30.0–36.0)
MCV: 84.5 fl (ref 78.0–100.0)
Monocytes Absolute: 0.6 10*3/uL (ref 0.1–1.0)
Monocytes Relative: 11.1 % (ref 3.0–12.0)
Neutro Abs: 3.1 10*3/uL (ref 1.4–7.7)
Neutrophils Relative %: 54.3 % (ref 43.0–77.0)
Platelets: 158 10*3/uL (ref 150.0–400.0)
RBC: 4.49 Mil/uL (ref 4.22–5.81)
RDW: 14.1 % (ref 11.5–15.5)
WBC: 5.7 10*3/uL (ref 4.0–10.5)

## 2020-11-13 LAB — COMPREHENSIVE METABOLIC PANEL
ALT: 9 U/L (ref 0–53)
AST: 15 U/L (ref 0–37)
Albumin: 3.9 g/dL (ref 3.5–5.2)
Alkaline Phosphatase: 67 U/L (ref 39–117)
BUN: 17 mg/dL (ref 6–23)
CO2: 29 mEq/L (ref 19–32)
Calcium: 10.4 mg/dL (ref 8.4–10.5)
Chloride: 105 mEq/L (ref 96–112)
Creatinine, Ser: 1.33 mg/dL (ref 0.40–1.50)
GFR: 53.07 mL/min — ABNORMAL LOW (ref >60.00)
Glucose, Bld: 107 mg/dL — ABNORMAL HIGH (ref 70–99)
Potassium: 3.5 mEq/L (ref 3.5–5.1)
Sodium: 136 mEq/L (ref 135–145)
Total Bilirubin: 0.6 mg/dL (ref 0.2–1.2)
Total Protein: 9.2 g/dL — ABNORMAL HIGH (ref 6.0–8.3)

## 2020-11-13 LAB — LDL CHOLESTEROL, DIRECT: Direct LDL: 81 mg/dL

## 2020-11-13 LAB — HEMOGLOBIN A1C: Hgb A1c MFr Bld: 5.8 % (ref 4.6–6.5)

## 2021-01-13 ENCOUNTER — Other Ambulatory Visit: Payer: Self-pay | Admitting: Family Medicine

## 2021-02-09 ENCOUNTER — Other Ambulatory Visit: Payer: Self-pay

## 2021-02-09 ENCOUNTER — Other Ambulatory Visit (HOSPITAL_BASED_OUTPATIENT_CLINIC_OR_DEPARTMENT_OTHER): Payer: Self-pay

## 2021-02-09 ENCOUNTER — Ambulatory Visit: Payer: Medicare Other | Attending: Internal Medicine

## 2021-02-09 DIAGNOSIS — Z23 Encounter for immunization: Secondary | ICD-10-CM

## 2021-02-09 MED ORDER — PFIZER-BIONT COVID-19 VAC-TRIS 30 MCG/0.3ML IM SUSP
INTRAMUSCULAR | 0 refills | Status: DC
  Filled 2021-02-09: qty 0.3, 1d supply, fill #0

## 2021-02-09 NOTE — Progress Notes (Signed)
   Covid-19 Vaccination Clinic  Name:  [name]    MRN: [id number] DOB: 1946/09/02  02/09/2021  [name] was observed post Covid-19 immunization for 15 minutes without incident. He was provided with Vaccine Information Sheet and instruction to access the V-Safe system.   [name] was instructed to call 911 with any severe reactions post vaccine: Marland Kitchen Difficulty breathing  . Swelling of face and throat  . A fast heartbeat  . A bad rash all over body  . Dizziness and weakness   Immunizations Administered    Name Date Dose VIS Date Route   PFIZER Comrnaty(Gray TOP) Covid-19 Vaccine 02/09/2021  8:27 AM 0.3 mL 09/18/2020 Intramuscular   Manufacturer: [employer], [employer]   Lot: [id number]   NDC: [id number]

## 2021-03-13 ENCOUNTER — Telehealth: Payer: Self-pay

## 2021-03-13 NOTE — Chronic Care Management (AMB) (Signed)
    Chronic Care Management Pharmacy Assistant   Name: Caleb Horton  MRN: [id number] DOB: Feb 18, 1946   Reason for Encounter: General adherence call    Recent office visits:  02/09/21-Patient engagement center (Referred by Dr Yong Channel) Patient received booster Pfizer Covid vaccine.  11/13/20-Caleb Hunter MD (PCP)- Office visit for annual physical. Labs ordered. No medication changes. Patient advised to get shingrix vaccine at local pharmacy and scheduled dental follow up.  Follow up in 6 months.   Recent consult visits:  10/29/20-Caleb Pyrtle MD (Gastroenterology)- Colonoscopy performed and completed.   Hospital visits:  None in previous 6 months  Medications: Outpatient Encounter Medications as of 03/13/2021  Medication Sig  . atorvastatin (LIPITOR) 10 MG tablet TAKE 1 TABLET BY MOUTH  DAILY  . COVID-19 mRNA Vac-TriS, Pfizer, (PFIZER-BIONT COVID-19 VAC-TRIS) SUSP injection Inject into the muscle.  . hydrochlorothiazide (HYDRODIURIL) 12.5 MG tablet TAKE 1 TABLET BY MOUTH  DAILY  . Omega-3 Fatty Acids (FISH OIL) 1000 MG CAPS Take by mouth.  . triamcinolone ointment (KENALOG) 0.5 % Apply 1 application topically 2 (two) times daily. Up to 7 days as needed for skin irritation such as poison ivy  . valsartan (DIOVAN) 80 MG tablet TAKE 2 TABLETS BY MOUTH  DAILY   No facility-administered encounter medications on file as of 03/13/2021.   Have you had any problems recently with your health? Patient denies problems with his health.  Have you had any problems with your pharmacy? Patient denies problems with obtaining medications.   What issues or side effects are you having with your medications? Patient denies side effects with any medications.   What would you like me to pass along to Caleb Horton,CPP for them to help you with?  Patient does not have anything to pass along.   What can we do to take care of you better? Patient states we are doing a great job of taking care of his needs.     Questioned patient about last fill dates and he said he still takes Atorvastatin and Valsartan and gets them through mail order.  He was in the car so did not have fill dates available.    Star Rating Drugs:  Atorvastatin 10mg   Last filled 10/03/20  90DS Valsartan 80mg   Last filled 08/26/20  90DS  [name]

## 2021-04-16 DIAGNOSIS — R350 Frequency of micturition: Secondary | ICD-10-CM | POA: Diagnosis not present

## 2021-05-13 ENCOUNTER — Telehealth: Payer: Self-pay | Admitting: Pharmacist

## 2021-05-13 NOTE — Chronic Care Management (AMB) (Addendum)
    Chronic Care Management Pharmacy Assistant   Name: Caleb Horton  MRN: [id number] DOB: 22-Nov-1945   Reason for Encounter: General Adherence Disease State Call    Recent office visits:  None  Recent consult visits:  None  Hospital visits:  None in previous 6 months  Medications: Outpatient Encounter Medications as of 05/13/2021  Medication Sig   atorvastatin (LIPITOR) 10 MG tablet TAKE 1 TABLET BY MOUTH  DAILY   COVID-19 mRNA Vac-TriS, Pfizer, (PFIZER-BIONT COVID-19 VAC-TRIS) SUSP injection Inject into the muscle.   hydrochlorothiazide (HYDRODIURIL) 12.5 MG tablet TAKE 1 TABLET BY MOUTH  DAILY   Omega-3 Fatty Acids (FISH OIL) 1000 MG CAPS Take by mouth.   triamcinolone ointment (KENALOG) 0.5 % Apply 1 application topically 2 (two) times daily. Up to 7 days as needed for skin irritation such as poison ivy   valsartan (DIOVAN) 80 MG tablet TAKE 2 TABLETS BY MOUTH  DAILY   No facility-administered encounter medications on file as of 05/13/2021.    Reviewed chart for medication changes ahead of medication coordination call.  No OVs, Consults, or hospital visits since last care coordination call/Pharmacist visit.   No medication changes indicated.  Patient Questions: Have you had any problems recently with your health? Patient states he has not had any problems recently with his health.  Have you had any problems with your pharmacy? Patient states he has not had any problems with his pharmacy.  What issues or side effects are you having with your medications? Patient states he is not currently having any issues or side effects from any of his medications.  What would you like me to pass along to [name], CPP for him to help you with?  Patient states he does not have anything to pass along at this time.  What can we do to take care of you better? Patient did not have any suggestions.  Patient scheduled a follow up appointment for 08/18/2021 at 1:30 pm.  Future  Appointments  Date Time Provider [place]  07/16/2021 11:45 AM [facility] HEALTH COACH [facility]  08/18/2021  1:30 PM [facility] CCM PHARMACIST [facility]    Star Rating Drugs: Atorvastatin 10 mg last filled 12/27/2020 90 DS Valsartan last filled 11/19/2020  April D Calhoun, [place] Pharmacist Assistant [phone]    6 minutes spent in review, coordination, and documentation.  Reviewed by: Beverly Milch, PharmD Clinical Pharmacist [phone]

## 2021-05-14 ENCOUNTER — Other Ambulatory Visit: Payer: Self-pay | Admitting: Family Medicine

## 2021-05-20 ENCOUNTER — Other Ambulatory Visit: Payer: Self-pay | Admitting: Family Medicine

## 2021-06-08 ENCOUNTER — Telehealth: Payer: Self-pay | Admitting: Pharmacist

## 2021-06-08 NOTE — Chronic Care Management (AMB) (Signed)
    Chronic Care Management Pharmacy Assistant   Name: Caleb Horton  MRN: [id number] DOB: 03/09/1946   Reason for Encounter: Chart Review    Recent office visits:  None  Recent consult visits:  None  Hospital visits:  None in previous 6 months  Medications: Outpatient Encounter Medications as of 06/08/2021  Medication Sig   atorvastatin (LIPITOR) 10 MG tablet TAKE 1 TABLET BY MOUTH  DAILY   COVID-19 mRNA Vac-TriS, Pfizer, (PFIZER-BIONT COVID-19 VAC-TRIS) SUSP injection Inject into the muscle.   hydrochlorothiazide (HYDRODIURIL) 12.5 MG tablet TAKE 1 TABLET BY MOUTH  DAILY   Omega-3 Fatty Acids (FISH OIL) 1000 MG CAPS Take by mouth.   triamcinolone ointment (KENALOG) 0.5 % Apply 1 application topically 2 (two) times daily. Up to 7 days as needed for skin irritation such as poison ivy   valsartan (DIOVAN) 80 MG tablet TAKE 2 TABLETS BY MOUTH  DAILY   No facility-administered encounter medications on file as of 06/08/2021.    Reviewed chart for medication changes.  No OVs, Consults, or hospital visits since last care coordination call/Pharmacist visit.  No medication changes indicated.  Patient has a scheduled follow up with the clinical pharmacist.  No gaps in adherence identified.  Future Appointments  Date Time Provider [place]  07/16/2021 11:45 AM [facility] HEALTH COACH [facility]  08/18/2021  1:30 PM [facility] CCM PHARMACIST [facility]     April D Calhoun, [place] Pharmacist Assistant [phone]

## 2021-06-17 ENCOUNTER — Telehealth: Payer: Self-pay | Admitting: Pharmacist

## 2021-06-17 NOTE — Chronic Care Management (AMB) (Addendum)
    Chronic Care Management Pharmacy Assistant   Name: IZEA BRENSINGER  MRN: [id number] DOB: [birth year]  Reason for Encounter: General Adherence Call    Recent office visits:  None  Recent consult visits:  None  Hospital visits:  None in previous 6 months  Medications: Outpatient Encounter Medications as of 06/17/2021  Medication Sig   atorvastatin (LIPITOR) 10 MG tablet TAKE 1 TABLET BY MOUTH  DAILY   COVID-19 mRNA Vac-TriS, Pfizer, (PFIZER-BIONT COVID-19 VAC-TRIS) SUSP injection Inject into the muscle.   hydrochlorothiazide (HYDRODIURIL) 12.5 MG tablet TAKE 1 TABLET BY MOUTH  DAILY   Omega-3 Fatty Acids (FISH OIL) 1000 MG CAPS Take by mouth.   triamcinolone ointment (KENALOG) 0.5 % Apply 1 application topically 2 (two) times daily. Up to 7 days as needed for skin irritation such as poison ivy   valsartan (DIOVAN) 80 MG tablet TAKE 2 TABLETS BY MOUTH  DAILY   No facility-administered encounter medications on file as of 06/17/2021.   Patient Questions: Have you had any problems recently with your health? Patient states he has not had any problems recently with his health.  Have you had any problems with your pharmacy? Patient states he has not had any problems recently with his pharmacy.  What issues or side effects are you having with your medications? Patient states he is not currently having any issues or side effects with any of his medications.  What would you like me to pass along to [name], CPP for him to help you with?  Patient did not have anything to pass along at this time.  What can we do to take care of you better? Patient did not have any suggestions.   Care Gaps: Hepatitis C Screening: Postponed until 10/08/2098 Zoster Vaccines- Shingrix: Overdue - never done COVID-19 Vaccination: (5- Booster for [employer] series) Overdue since 06/12/2021 PNA Vaccination: Completed Influenza Vaccination: Overdue since 05/11/2021 Colonoscopy: Next due on  10/29/2025 Tetanus/DTAP: Completed HPV Vaccines: Aged [employer] Wellness: Due 06/2021  Patient is aware that his medicare annual wellness is due. He was encouraged to schedule this appointment.  Future Appointments  Date Time Provider [place]  07/16/2021 11:45 AM [facility] HEALTH COACH [facility]  08/18/2021  1:30 PM [facility] CCM PHARMACIST [facility]    Star Rating Drugs: Atorvastatin last filled 12/27/2020 90 DS Valsartan last filled 02/12/2021 90 DS  April D Calhoun, [place] Pharmacist Assistant [phone]   5 minutes spent in review, coordination, and documentation.  Reviewed by: Beverly Milch, PharmD Clinical Pharmacist [phone]

## 2021-07-16 ENCOUNTER — Ambulatory Visit (INDEPENDENT_AMBULATORY_CARE_PROVIDER_SITE_OTHER): Payer: Medicare Other

## 2021-07-16 VITALS — BP 120/72 | HR 95 | Temp 97.4°F | Wt 230.0 lb

## 2021-07-16 DIAGNOSIS — Z0000 Encounter for general adult medical examination without abnormal findings: Secondary | ICD-10-CM | POA: Diagnosis not present

## 2021-07-16 NOTE — Patient Instructions (Signed)
Caleb Horton , Thank you for taking time to come for your Medicare Wellness Visit. I appreciate your ongoing commitment to your health goals. Please review the following plan we discussed and let me know if I can assist you in the future.   Screening recommendations/referrals: Colonoscopy: 10/29/20 repeat every 5 years due 10/30/27 Recommended yearly ophthalmology/optometry visit for glaucoma screening and checkup Recommended yearly dental visit for hygiene and checkup  Vaccinations: Influenza vaccine: due and dicussed Pneumococcal vaccine: Completed  Tdap vaccine: Up to date Shingles vaccine: Shingrix discussed. Please contact your pharmacy for coverage information.    Covid-19: Completed 2/23, 3/16, 07/15/20 & 02/09/21  Advanced directives: Please bring a copy of your health care power of attorney and living will to the office at your convenience.  Conditions/risks identified: lose weight   Next appointment: Follow up in one year for your annual wellness visit.   Preventive Care 88 Years and Older, Male Preventive care refers to lifestyle choices and visits with your health care provider that can promote health and wellness. What does preventive care include? A yearly physical exam. This is also called an annual well check. Dental exams once or twice a year. Routine eye exams. Ask your health care provider how often you should have your eyes checked. Personal lifestyle choices, including: Daily care of your teeth and gums. Regular physical activity. Eating a healthy diet. Avoiding tobacco and drug use. Limiting alcohol use. Practicing safe sex. Taking low doses of aspirin every day. Taking vitamin and mineral supplements as recommended by your health care provider. What happens during an annual well check? The services and screenings done by your health care provider during your annual well check will depend on your age, overall health, lifestyle risk factors, and family history of  disease. Counseling  Your health care provider may ask you questions about your: Alcohol use. Tobacco use. Drug use. Emotional well-being. Home and relationship well-being. Sexual activity. Eating habits. History of falls. Memory and ability to understand (cognition). Work and work Statistician. Screening  You may have the following tests or measurements: Height, weight, and BMI. Blood pressure. Lipid and cholesterol levels. These may be checked every 5 years, or more frequently if you are over 6 years old. Skin check. Lung cancer screening. You may have this screening every year starting at age 18 if you have a 30-pack-year history of smoking and currently smoke or have quit within the past 15 years. Fecal occult blood test (FOBT) of the stool. You may have this test every year starting at age 54. Flexible sigmoidoscopy or colonoscopy. You may have a sigmoidoscopy every 5 years or a colonoscopy every 10 years starting at age 79. Prostate cancer screening. Recommendations will vary depending on your family history and other risks. Hepatitis C blood test. Hepatitis B blood test. Sexually transmitted disease (STD) testing. Diabetes screening. This is done by checking your blood sugar (glucose) after you have not eaten for a while (fasting). You may have this done every 1-3 years. Abdominal aortic aneurysm (AAA) screening. You may need this if you are a current or former smoker. Osteoporosis. You may be screened starting at age 67 if you are at high risk. Talk with your health care provider about your test results, treatment options, and if necessary, the need for more tests. Vaccines  Your health care provider may recommend certain vaccines, such as: Influenza vaccine. This is recommended every year. Tetanus, diphtheria, and acellular pertussis (Tdap, Td) vaccine. You may need a Td booster every  10 years. Zoster vaccine. You may need this after age 69. Pneumococcal 13-valent  conjugate (PCV13) vaccine. One dose is recommended after age 60. Pneumococcal polysaccharide (PPSV23) vaccine. One dose is recommended after age 32. Talk to your health care provider about which screenings and vaccines you need and how often you need them. This information is not intended to replace advice given to you by your health care provider. Make sure you discuss any questions you have with your health care provider. Document Released: 10/24/2015 Document Revised: 06/16/2016 Document Reviewed: 07/29/2015 Elsevier Interactive Patient Education  2017 [place] Prevention in the Home Falls can cause injuries. They can happen to people of all ages. There are many things you can do to make your home safe and to help prevent falls. What can I do on the outside of my home? Regularly fix the edges of walkways and driveways and fix any cracks. Remove anything that might make you trip as you walk through a door, such as a raised step or threshold. Trim any bushes or trees on the path to your home. Use bright outdoor lighting. Clear any walking paths of anything that might make someone trip, such as rocks or tools. Regularly check to see if handrails are loose or broken. Make sure that both sides of any steps have handrails. Any raised decks and porches should have guardrails on the edges. Have any leaves, snow, or ice cleared regularly. Use sand or salt on walking paths during winter. Clean up any spills in your garage right away. This includes oil or grease spills. What can I do in the bathroom? Use night lights. Install grab bars by the toilet and in the tub and shower. Do not use towel bars as grab bars. Use non-skid mats or decals in the tub or shower. If you need to sit down in the shower, use a plastic, non-slip stool. Keep the floor dry. Clean up any water that spills on the floor as soon as it happens. Remove soap buildup in the tub or shower regularly. Attach bath mats  securely with double-sided non-slip rug tape. Do not have throw rugs and other things on the floor that can make you trip. What can I do in the bedroom? Use night lights. Make sure that you have a light by your bed that is easy to reach. Do not use any sheets or blankets that are too big for your bed. They should not hang down onto the floor. Have a firm chair that has side arms. You can use this for support while you get dressed. Do not have throw rugs and other things on the floor that can make you trip. What can I do in the kitchen? Clean up any spills right away. Avoid walking on wet floors. Keep items that you use a lot in easy-to-reach places. If you need to reach something above you, use a strong step stool that has a grab bar. Keep electrical cords out of the way. Do not use floor polish or wax that makes floors slippery. If you must use wax, use non-skid floor wax. Do not have throw rugs and other things on the floor that can make you trip. What can I do with my stairs? Do not leave any items on the stairs. Make sure that there are handrails on both sides of the stairs and use them. Fix handrails that are broken or loose. Make sure that handrails are as long as the stairways. Check any carpeting to make  sure that it is firmly attached to the stairs. Fix any carpet that is loose or worn. Avoid having throw rugs at the top or bottom of the stairs. If you do have throw rugs, attach them to the floor with carpet tape. Make sure that you have a light switch at the top of the stairs and the bottom of the stairs. If you do not have them, ask someone to add them for you. What else can I do to help prevent falls? Wear shoes that: Do not have high heels. Have rubber bottoms. Are comfortable and fit you well. Are closed at the toe. Do not wear sandals. If you use a stepladder: Make sure that it is fully opened. Do not climb a closed stepladder. Make sure that both sides of the stepladder  are locked into place. Ask someone to hold it for you, if possible. Clearly mark and make sure that you can see: Any grab bars or handrails. First and last steps. Where the edge of each step is. Use tools that help you move around (mobility aids) if they are needed. These include: Canes. Walkers. Scooters. Crutches. Turn on the lights when you go into a dark area. Replace any light bulbs as soon as they burn out. Set up your furniture so you have a clear path. Avoid moving your furniture around. If any of your floors are uneven, fix them. If there are any pets around you, be aware of where they are. Review your medicines with your doctor. Some medicines can make you feel dizzy. This can increase your chance of falling. Ask your doctor what other things that you can do to help prevent falls. This information is not intended to replace advice given to you by your health care provider. Make sure you discuss any questions you have with your health care provider. Document Released: 07/24/2009 Document Revised: 03/04/2016 Document Reviewed: 11/01/2014 Elsevier Interactive Patient Education  2017 [employer].

## 2021-07-16 NOTE — Progress Notes (Signed)
[name], LPN   Subjective:   Caleb Horton is a 75 y.o. male who presents for Medicare Annual/Subsequent preventive examination.  Review of Systems     Cardiac Risk Factors include: advanced age (>9men, >11 women);hypertension;dyslipidemia;male gender;obesity (BMI >30kg/m2)     Objective:    Today's Vitals   07/16/21 1152  BP: 120/72  Pulse: 95  Temp: (!) 97.4 F (36.3 C)  SpO2: 95%  Weight: 230 lb (104.3 kg)   Body mass index is 31.19 kg/m.  Advanced Directives 07/16/2021 07/10/2020 06/14/2019 05/23/2018 02/02/2017 09/19/2015 06/26/2015  Does Patient Have a Medical Advance Directive? Yes Yes No;Yes Yes No No No  Type of Paramedic of [place];Living will Living will;Healthcare Power of [place];Living will - - -  Does patient want to make changes to medical advance directive? - - No - Patient declined No - Patient declined - - -  Copy of [place] in Chart? - No - copy requested No - copy requested No - copy requested - - -  Would patient like information on creating a medical advance directive? - - - - - - No - patient declined information    Current Medications (verified) Outpatient Encounter Medications as of 07/16/2021  Medication Sig   atorvastatin (LIPITOR) 10 MG tablet TAKE 1 TABLET BY MOUTH  DAILY   hydrochlorothiazide (HYDRODIURIL) 12.5 MG tablet TAKE 1 TABLET BY MOUTH  DAILY   Omega-3 Fatty Acids (FISH OIL) 1000 MG CAPS Take by mouth.   valsartan (DIOVAN) 80 MG tablet TAKE 2 TABLETS BY MOUTH  DAILY   COVID-19 mRNA Vac-TriS, Pfizer, (PFIZER-BIONT COVID-19 VAC-TRIS) SUSP injection Inject into the muscle.   triamcinolone ointment (KENALOG) 0.5 % Apply 1 application topically 2 (two) times daily. Up to 7 days as needed for skin irritation such as poison ivy (Patient not taking: Reported on 07/16/2021)   No facility-administered encounter medications on file as of  07/16/2021.    Allergies (verified) Patient has no known allergies.   History: Past Medical History:  Diagnosis Date   Anemia    Hypertension    INGROWN NAIL 11/10/2010   Annotation: bilateral  Qualifier: Diagnosis of  By: Arnoldo Morale MD, Balinda Quails    Sickle cell trait [facility])    Past Surgical History:  Procedure Laterality Date   CIRCUMCISION     age 64   COLONOSCOPY     Family History  Problem Relation Age of Onset   Hypertension Father    Heart disease Father    Hearing loss Mother    Heart disease Daughter    Obesity Daughter    Obesity Son    Diabetes Maternal Grandmother    Colon cancer Neg Hx    Esophageal cancer Neg Hx    Stomach cancer Neg Hx    Rectal cancer Neg Hx    Social History   Socioeconomic History   Marital status: Married    Spouse name: Ruby   Number of children: Not on file   Years of education: Not on file   Highest education level: Some college, no degree  Occupational History    Employer: RETIRED  Tobacco Use   Smoking status: Former    Packs/day: 0.50    Years: 3.00    Pack years: 1.50    Types: Cigarettes    Quit date: 10/11/1968    Years since quitting: 52.7   Smokeless tobacco: Never  Vaping Use  Vaping Use: Never used  Substance and Sexual Activity   Alcohol use: Yes    Alcohol/week: 5.0 standard drinks    Types: 1 Shots of liquor, 4 Standard drinks or equivalent per week    Comment: once a week    Drug use: No   Sexual activity: Not on file  Other Topics Concern   Not on file  Social History Narrative   Married (3rd marriage). 5 children (3 from 1st, 2 from second), 7 grandkids.       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 He lives with his wife in a 2 story house. He drinks one cup of coffee QOD, and occasionally drinks tea.   Social Determinants of Health   Financial Resource Strain: Low Risk    Difficulty of Paying Living Expenses: Not  hard at all  Food Insecurity: No Food Insecurity   Worried About Charity fundraiser in the Last Year: Never true   [place] in the Last Year: Never true  Transportation Needs: No Transportation Needs   Lack of Transportation (Medical): No   Lack of Transportation (Non-Medical): No  Physical Activity: Sufficiently Active   Days of Exercise per Week: 6 days   Minutes of Exercise per Session: 50 min  Stress: No Stress Concern Present   Feeling of Stress : Not at all  Social Connections: Moderately Integrated   Frequency of Communication with Friends and Family: More than three times a week   Frequency of Social Gatherings with Friends and Family: More than three times a week   Attends Religious Services: Never   Marine scientist or Organizations: Yes   Attends Music therapist: 1 to 4 times per year   Marital Status: Married    Tobacco Counseling Counseling given: Not Answered   Clinical Intake:  Pre-visit preparation completed: Yes  Pain : No/denies pain     BMI - recorded: 31.19 Nutritional Status: BMI > 30  Obese Nutritional Risks: None Diabetes: No  How often do you need to have someone help you when you read instructions, pamphlets, or other written materials from your doctor or pharmacy?: 1 - Never  Diabetic?no  Interpreter Needed?: No  Information entered by :: Charlott Rakes, LPN   Activities of Daily Living In your present state of health, do you have any difficulty performing the following activities: 07/16/2021  Hearing? N  Vision? N  Difficulty concentrating or making decisions? N  Walking or climbing stairs? N  Dressing or bathing? N  Doing errands, shopping? N  Preparing Food and eating ? N  Using the Toilet? N  In the past six months, have you accidently leaked urine? N  Do you have problems with loss of bowel control? N  Managing your Medications? N  Managing your Finances? N  Housekeeping or managing your  Housekeeping? N  Some recent data might be hidden    Patient Care Team: Marin Olp, MD as PCP - General (Family Medicine) Buford Dresser, MD as PCP - Cardiology (Cardiology) Buford Dresser, MD as Consulting Physician (Cardiology) Madelin Rear, [facility] as Pharmacist (Pharmacist)  Indicate any recent Medical Services you may have received from other than [facility] providers in the past year (date may be approximate).     Assessment:   This is a routine wellness examination for [place].  Hearing/Vision screen Hearing Screening - Comments:: Pt  denies any hearing  Vision Screening - Comments:: Pt follows up with [facility] for [facility] exams   Dietary issues and exercise activities discussed: Current Exercise Habits: Home exercise routine, Type of exercise: Other - see comments (silver sneakers), Time (Minutes): 45, Frequency (Times/Week): 6, Weekly Exercise (Minutes/Week): 270   Goals Addressed             This Visit's Progress    Patient Stated       Lose weight        Depression Screen PHQ 2/9 Scores 07/16/2021 11/13/2020 07/10/2020 08/29/2019 06/14/2019 02/28/2019 10/25/2018  PHQ - 2 Score 0 0 0 0 0 0 0  PHQ- 9 Score - - - 0 - 0 -    Fall Risk Fall Risk  07/16/2021 11/13/2020 07/10/2020 05/06/2020 06/14/2019  Falls in the past year? 0 0 0 0 0  Number falls in past yr: 0 0 0 0 0  Injury with Fall? 0 0 0 0 0  Risk for fall due to : Impaired vision - Impaired vision - -  Follow up Falls prevention discussed - Falls prevention discussed - Education provided    FALL RISK PREVENTION PERTAINING TO THE HOME:  Any stairs in or around the home? Yes  If so, are there any without handrails? No  Home free of loose throw rugs in walkways, pet beds, electrical cords, etc? Yes  Adequate lighting in your home to reduce risk of falls? Yes   ASSISTIVE DEVICES UTILIZED TO PREVENT FALLS:  Life alert? No  Use of a cane, walker or w/c? No  Grab bars in the bathroom? Yes  Shower chair  or bench in shower? No  Elevated toilet seat or a handicapped toilet? No   TIMED UP AND GO:  Was the test performed? No .  Cognitive Function: MMSE - Mini Mental State Exam 02/02/2017  Not completed: (No Data)     6CIT Screen 07/16/2021 07/10/2020 06/14/2019 05/23/2018 02/02/2017  What Year? 0 points 0 points 0 points 0 points 0 points  What month? 0 points 0 points 0 points 0 points 0 points  What time? 0 points - 0 points 0 points 0 points  Count back from 20 0 points 0 points 0 points 0 points 0 points  Months in reverse 0 points 0 points 0 points 0 points 0 points  Repeat phrase 0 points 0 points 2 points - 2 points  Total Score 0 - 2 - 2    Immunizations Immunization History  Administered Date(s) Administered   Fluad Quad(high Dose 65+) 06/14/2019, 09/15/2020   Influenza Split 08/15/2012   Influenza, High Dose Seasonal PF 07/14/2013, 07/05/2014   Influenza-Unspecified 07/20/2015, 07/26/2018   PFIZER Comirnaty(Gray Top)Covid-19 Tri-Sucrose Vaccine 02/09/2021   PFIZER(Purple Top)SARS-COV-2 Vaccination 12/04/2019, 12/25/2019, 07/15/2020   Pneumococcal Conjugate-13 07/05/2014   Pneumococcal Polysaccharide-23 02/12/2013   Td 10/11/2005   Tdap 03/17/2018   Zoster, Live 02/04/2015    TDAP status: Up to date  Flu Vaccine status: Due, Education has been provided regarding the importance of this vaccine. Advised may receive this vaccine at local pharmacy or Health Dept. Aware to provide a copy of the vaccination record if obtained from local pharmacy or Health Dept. Verbalized acceptance and understanding.  Pneumococcal vaccine status: Up to date  Covid-19 vaccine status: Completed vaccines  Qualifies for Shingles Vaccine? Yes   Zostavax completed Yes   Shingrix Completed?: No.    Education has been provided regarding the importance of this vaccine. Patient has been advised to  call insurance company to determine out of pocket expense if they have not yet received this vaccine.  Advised may also receive vaccine at local pharmacy or Health Dept. Verbalized acceptance and understanding.  Screening Tests Health Maintenance  Topic Date Due   Zoster Vaccines- Shingrix (1 of 2) Never done   INFLUENZA VACCINE  05/11/2021   COVID-19 Vaccine (5 - Booster for Pfizer series) 06/12/2021   Hepatitis C Screening  10/08/2098 (Originally 06/19/1964)   COLONOSCOPY (Pts 45-19yrs Insurance coverage will need to be confirmed)  10/29/2025   TETANUS/TDAP  03/17/2028   HPV VACCINES  Aged Out    Health Maintenance  Health Maintenance Due  Topic Date Due   Zoster Vaccines- Shingrix (1 of 2) Never done   INFLUENZA VACCINE  05/11/2021   COVID-19 Vaccine (5 - Booster for [place] series) 06/12/2021    Colorectal cancer screening: Type of screening: Colonoscopy. Completed 10/29/20. Repeat every 5 years  Additional Screening:  Hepatitis C Screening: does qualify;  Vision Screening: Recommended annual ophthalmology exams for early detection of glaucoma and other disorders of the eye. Is the patient up to date with their annual eye exam?  Yes  Who is the provider or what is the name of the office in which the patient attends annual eye exams? [facility]  If pt is not established with a provider, would they like to be referred to a provider to establish care? No .   Dental Screening: Recommended annual dental exams for proper oral hygiene  Community Resource Referral / Chronic Care Management: CRR required this visit?  No   CCM required this visit?  No      Plan:     I have personally reviewed and noted the following in the patient's chart:   Medical and social history Use of alcohol, tobacco or illicit drugs  Current medications and supplements including opioid prescriptions. Patient is not currently taking opioid prescriptions. Functional ability and status Nutritional status Physical activity Advanced directives List of other physicians Hospitalizations, surgeries,  and ER visits in previous 12 months Vitals Screenings to include cognitive, depression, and falls Referrals and appointments  In addition, I have reviewed and discussed with patient certain preventive protocols, quality metrics, and best practice recommendations. A written personalized care plan for preventive services as well as general preventive health recommendations were provided to patient.     [name], LPN   31/02/4007   Nurse Notes: None

## 2021-08-12 ENCOUNTER — Emergency Department (HOSPITAL_BASED_OUTPATIENT_CLINIC_OR_DEPARTMENT_OTHER)
Admission: EM | Admit: 2021-08-12 | Discharge: 2021-08-12 | Disposition: A | Discharge status: Home / Self Care | Payer: Medicare Other | Attending: Emergency Medicine | Admitting: Emergency Medicine

## 2021-08-12 ENCOUNTER — Emergency Department (HOSPITAL_BASED_OUTPATIENT_CLINIC_OR_DEPARTMENT_OTHER): Payer: Medicare Other

## 2021-08-12 ENCOUNTER — Ambulatory Visit: Payer: Medicare Other | Attending: Internal Medicine

## 2021-08-12 ENCOUNTER — Other Ambulatory Visit (HOSPITAL_BASED_OUTPATIENT_CLINIC_OR_DEPARTMENT_OTHER): Payer: Self-pay

## 2021-08-12 ENCOUNTER — Other Ambulatory Visit: Payer: Self-pay

## 2021-08-12 ENCOUNTER — Encounter (HOSPITAL_BASED_OUTPATIENT_CLINIC_OR_DEPARTMENT_OTHER): Payer: Self-pay

## 2021-08-12 DIAGNOSIS — Z79899 Other long term (current) drug therapy: Secondary | ICD-10-CM | POA: Diagnosis not present

## 2021-08-12 DIAGNOSIS — M25561 Pain in right knee: Secondary | ICD-10-CM | POA: Insufficient documentation

## 2021-08-12 DIAGNOSIS — I129 Hypertensive chronic kidney disease with stage 1 through stage 4 chronic kidney disease, or unspecified chronic kidney disease: Secondary | ICD-10-CM | POA: Diagnosis not present

## 2021-08-12 DIAGNOSIS — Z8546 Personal history of malignant neoplasm of prostate: Secondary | ICD-10-CM | POA: Diagnosis not present

## 2021-08-12 DIAGNOSIS — Z23 Encounter for immunization: Secondary | ICD-10-CM

## 2021-08-12 DIAGNOSIS — Z87891 Personal history of nicotine dependence: Secondary | ICD-10-CM | POA: Diagnosis not present

## 2021-08-12 DIAGNOSIS — N182 Chronic kidney disease, stage 2 (mild): Secondary | ICD-10-CM | POA: Insufficient documentation

## 2021-08-12 DIAGNOSIS — W228XXA Striking against or struck by other objects, initial encounter: Secondary | ICD-10-CM | POA: Insufficient documentation

## 2021-08-12 MED ORDER — NAPROXEN 500 MG PO TABS
500.0000 mg | ORAL_TABLET | Freq: Two times a day (BID) | ORAL | 0 refills | Status: AC
  Filled 2021-08-12: qty 14, 7d supply, fill #0

## 2021-08-12 MED ORDER — INFLUENZA VAC A&B SA ADJ QUAD 0.5 ML IM PRSY
PREFILLED_SYRINGE | INTRAMUSCULAR | 0 refills | Status: DC
  Filled 2021-08-12: qty 0.5, 1d supply, fill #0

## 2021-08-12 MED ORDER — PFIZER COVID-19 VAC BIVALENT 30 MCG/0.3ML IM SUSP
INTRAMUSCULAR | 0 refills | Status: DC
  Filled 2021-08-12: qty 0.3, 1d supply, fill #0

## 2021-08-12 MED ORDER — NAPROXEN 250 MG PO TABS
500.0000 mg | ORAL_TABLET | Freq: Once | ORAL | Status: AC
  Administered 2021-08-12: 500 mg via ORAL
  Filled 2021-08-12: qty 2

## 2021-08-12 NOTE — Discharge Instructions (Addendum)
I prescribed a short prescription of anti-inflammatories to help with pain control.  Please take 1 tablet twice a day for the next 7 days.  Please be aware that you will need to take this medication with food.  You may continue to wear your knee brace for comfort as needed.  Follow-up with orthopedist if your symptoms are not improved after 1 week.

## 2021-08-12 NOTE — Progress Notes (Signed)
   Covid-19 Vaccination Clinic  Name:  Caleb Horton    MRN: [id number] DOB: 14-Apr-1946  08/12/2021  [name] was observed post Covid-19 immunization for 15 minutes without incident. He was provided with Vaccine Information Sheet and instruction to access the V-Safe system.   [name] was instructed to call 911 with any severe reactions post vaccine: Difficulty breathing  Swelling of face and throat  A fast heartbeat  A bad rash all over body  Dizziness and weakness   Immunizations Administered     Name Date Dose VIS Date Route   Pfizer Covid-19 Vaccine Bivalent Booster 08/12/2021 10:42 AM 0.3 mL 06/10/2021 Intramuscular   Manufacturer: [place]   Lot: [id number]   [place]: [phone]
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
## 2021-08-17 NOTE — Progress Notes (Signed)
Chronic Care Management Pharmacy Note  08/18/2021 Name:  Caleb Horton MRN:  [id number] DOB:  June 02, 1946  Summary: PharmD follow up.  Recommendations/Changes made from today's visit: None - doing well overall  Plan: FU 6 months   Subjective: Caleb Horton is an 75 y.o. year old male who is a primary patient of Hunter, Brayton Mars, MD.  The CCM team was consulted for assistance with disease management and care coordination needs.    Engaged with patient by telephone for follow up visit in response to provider referral for pharmacy case management and/or care coordination services.   Consent to Services:  The patient was given the following information about Chronic Care Management services today, agreed to services, and gave verbal consent: 1. CCM service includes personalized support from designated clinical staff supervised by the primary care provider, including individualized plan of care and coordination with other care providers 2. 24/7 contact phone numbers for assistance for urgent and routine care needs. 3. Service will only be billed when office clinical staff spend 20 minutes or more in a month to coordinate care. 4. Only one practitioner may furnish and bill the service in a calendar month. 5.The patient may stop CCM services at any time (effective at the end of the month) by phone call to the office staff. 6. The patient will be responsible for cost sharing (co-pay) of up to 20% of the service fee (after annual deductible is met). Patient agreed to services and consent obtained.  Patient Care Team: Marin Olp, MD as PCP - General (Family Medicine) Buford Dresser, MD as PCP - Cardiology (Cardiology) Buford Dresser, MD as Consulting Physician (Cardiology) Madelin Rear, [facility] as Pharmacist (Pharmacist)  Recent office visits:  None   Recent consult visits:  None   Hospital visits:  08/15/21 (ED) - acute pain of right knee   Objective:  Lab  Results  Component Value Date   CREATININE 1.33 11/13/2020   BUN 17 11/13/2020   GFR 53.07 (L) 11/13/2020   GFRNONAA 37 (L) 04/19/2018   GFRAA 43 (L) 04/19/2018   NA 136 11/13/2020   K 3.5 11/13/2020   CALCIUM 10.4 11/13/2020   CO2 29 11/13/2020   GLUCOSE 107 (H) 11/13/2020    Lab Results  Component Value Date/Time   HGBA1C 5.8 11/13/2020 10:34 AM   HGBA1C 5.8 (H) 05/06/2020 03:46 PM   HGBA1C 6.6 02/01/2020 12:00 AM   GFR 53.07 (L) 11/13/2020 10:34 AM   GFR 70.61 08/29/2019 10:29 AM   MICROALBUR 0.8 05/17/2014 11:06 AM    Last diabetic Eye exam: No results found for: HMDIABEYEEXA  Last diabetic Foot exam: No results found for: HMDIABFOOTEX   Lab Results  Component Value Date   CHOL 146 05/06/2020   HDL 39 (L) 05/06/2020   LDLCALC 82 05/06/2020   LDLDIRECT 81.0 11/13/2020   TRIG 155 (H) 05/06/2020   CHOLHDL 3.7 05/06/2020    Hepatic Function Latest Ref Rng & Units 11/13/2020 05/06/2020 08/29/2019  Total Protein 6.0 - 8.3 g/dL 9.2(H) 8.6(H) 9.1(H)  Albumin 3.5 - 5.2 g/dL 3.9 - 4.0  AST 0 - 37 U/L _0 ALT 0 - 53 U/L _1 Alk Phosphatase 39 - 117 U/L 67 - 76  Total Bilirubin 0.2 - 1.2 mg/dL 0.6 0.6 0.5  Bilirubin, Direct 0.0 - 0.3 mg/dL - - -    Lab Results  Component Value Date/Time   TSH 2.49 04/21/2018 05:19 PM   TSH 3.10 02/22/2018 08:23  AM    CBC Latest Ref Rng & Units 11/13/2020 05/06/2020 08/29/2019  WBC 4.0 - 10.5 K/uL 5.7 7.3 6.7  Hemoglobin 13.0 - 17.0 g/dL 12.8(L) 12.5(L) 13.3  Hematocrit 39.0 - 52.0 % 38.0(L) 38.2(L) 39.8  Platelets 150.0 - 400.0 K/uL 158.0 152 136.0(L)    No results found for: VD25OH  Clinical ASCVD: No  The 10-year ASCVD risk score (Arnett DK, et al., 2019) is: 23.7%   Values used to calculate the score:     Age: 31 years     Sex: Male     Is Non-Hispanic African American: Yes     Diabetic: No     Tobacco smoker: No     Systolic Blood Pressure: 488 mmHg     Is BP treated: Yes     HDL Cholesterol: 39 mg/dL     Total  Cholesterol: 146 mg/dL    Depression screen [facility] 2/9 07/16/2021 11/13/2020 07/10/2020  Decreased Interest 0 0 0  Down, Depressed, Hopeless 0 0 0  PHQ - 2 Score 0 0 0  Altered sleeping - - -  Tired, decreased energy - - -  Change in appetite - - -  Feeling bad or failure about yourself  - - -  Trouble concentrating - - -  Moving slowly or fidgety/restless - - -  Suicidal thoughts - - -  PHQ-9 Score - - -  Difficult doing work/chores - - -     Social History   Tobacco Use  Smoking Status Former   Packs/day: 0.50   Years: 3.00   Pack years: 1.50   Types: Cigarettes   Quit date: 10/11/1968   Years since quitting: 52.8  Smokeless Tobacco Never   BP Readings from Last 3 Encounters:  08/12/21 137/74  07/16/21 120/72  11/13/20 124/74   Pulse Readings from Last 3 Encounters:  08/12/21 88  07/16/21 95  11/13/20 85   Wt Readings from Last 3 Encounters:  08/12/21 229 lb 15 oz (104.3 kg)  07/16/21 230 lb (104.3 kg)  11/13/20 235 lb (106.6 kg)   BMI Readings from Last 3 Encounters:  08/12/21 31.19 kg/m  07/16/21 31.19 kg/m  11/13/20 31.87 kg/m    Assessment/Interventions: Review of patient past medical history, allergies, medications, health status, including review of consultants reports, laboratory and other test data, was performed as part of comprehensive evaluation and provision of chronic care management services.   SDOH:  (Social Determinants of Health) assessments and interventions performed: Yes  Financial Resource Strain: Low Risk    Difficulty of Paying Living Expenses: Not hard at all    SDOH Screenings   Alcohol Screen: Not on file  Depression (PHQ2-9): Low Risk    PHQ-2 Score: 0  Financial Resource Strain: Low Risk    Difficulty of Paying Living Expenses: Not hard at all  Food Insecurity: No Food Insecurity   Worried About Charity fundraiser in the Last Year: Never true   Ran Out of Food in the Last Year: Never true  Housing: Low Risk    Last Housing  Risk Score: 0  Physical Activity: Sufficiently Active   Days of Exercise per Week: 6 days   Minutes of Exercise per Session: 50 min  Social Connections: Moderately Integrated   Frequency of Communication with Friends and Family: More than three times a week   Frequency of Social Gatherings with Friends and Family: More than three times a week   Attends Religious Services: Never   Active Member of [employer]  or Organizations: Yes   Attends Archivist Meetings: 1 to 4 times per year   Marital Status: Married  Stress: No Stress Concern Present   Feeling of Stress : Not at all  Tobacco Use: Medium Risk   Smoking Tobacco Use: Former   Smokeless Tobacco Use: Never   Passive Exposure: Not on file  Transportation Needs: No Transportation Needs   Lack of Transportation (Medical): No   Lack of Transportation (Non-Medical): No    CCM Care Plan  No Known Allergies  Medications Reviewed Today     Reviewed by Edythe Clarity, [facility] (Pharmacist) on 08/18/21 at 1354  Med List Status: <None>   Medication Order Taking? Sig Documenting Provider Last Dose Status Informant  atorvastatin (LIPITOR) 10 MG tablet [id number] Yes TAKE 1 TABLET BY MOUTH  DAILY Marin Olp, MD Taking Active   COVID-19 mRNA bivalent vaccine, Pfizer, (PFIZER COVID-19 [facility] BIVALENT) injection [id number] Yes Inject into the muscle. Carlyle Basques, MD Taking Active   COVID-19 mRNA Vac-TriS, Pfizer, (PFIZER-BIONT COVID-19 VAC-TRIS) SUSP injection [id number] Yes Inject into the muscle. Carlyle Basques, MD Taking Active   hydrochlorothiazide (HYDRODIURIL) 12.5 MG tablet [id number] Yes TAKE 1 TABLET BY MOUTH  DAILY Marin Olp, MD Taking Active   influenza vaccine adjuvanted (FLUAD) 0.5 ML injection [id number] Yes Inject into the muscle. Carlyle Basques, MD Taking Active   naproxen (NAPROSYN) 500 MG tablet [id number] Yes Take 1 tablet (500 mg total) by mouth 2 (two) times daily for 7 days. Janeece Fitting, PA-C Taking Active    Omega-3 Fatty Acids (FISH OIL) 1000 MG CAPS [id number] Yes Take by mouth. [provider] Taking Active   triamcinolone ointment (KENALOG) 0.5 % [id number] Yes Apply 1 application topically 2 (two) times daily. 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   Hyperglycemia 11/27/2007   Sickle-cell trait ([place]) 11/27/2007   Essential hypertension 11/27/2007   BPH (benign prostatic hyperplasia) 11/27/2007    Immunization History  Administered Date(s) Administered   Fluad Quad(high Dose 65+) 06/14/2019, 09/15/2020   Influenza Split 08/15/2012   Influenza, High Dose Seasonal PF 07/14/2013, 07/05/2014   Influenza-Unspecified 07/20/2015, 07/26/2018   PFIZER Comirnaty(Gray Top)Covid-19 Tri-Sucrose Vaccine 02/09/2021   PFIZER(Purple Top)SARS-COV-2 Vaccination 12/04/2019, 12/25/2019, 07/15/2020   Pfizer Covid-19 Vaccine Bivalent Booster 32yr & up 08/12/2021   Pneumococcal Conjugate-13 07/05/2014   Pneumococcal Polysaccharide-23 02/12/2013   Td 10/11/2005   Tdap 03/17/2018   Zoster, Live 02/04/2015    Conditions to be addressed/monitored:  HTN, HLD  Care Plan : General Pharmacy (Adult)  Updates made by DEdythe Clarity RPH since 08/18/2021 12:00 AM     Problem: HTN, HLD   Priority: High  Onset Date: 08/18/2021     Long-Range Goal: Patient-Specific Goal   Start Date: 08/18/2021  Expected End Date:  02/15/2022  This Visit's Progress: On track  Priority: High  Note:  Current Barriers:  None at this time  Pharmacist Clinical Goal(s):  Patient will maintain control of cholesterol and BP as evidenced by monitoring/labs  through collaboration with PharmD and provider.   Interventions: 1:1 collaboration with Marin Olp, MD regarding development and update of comprehensive plan of care as evidenced by provider attestation and co-signature Inter-disciplinary care team collaboration (see longitudinal plan of care) Comprehensive medication review performed; medication list updated in electronic medical record  Hypertension (BP goal <140/90) -Controlled -Current treatment: Valsartan 51m daily HCTZ 12.533mdaily -Medications previously tried: none noted  -Current home readings: not checking -Current exercise habits: normally works out at [employer] days per week, has not done so for the past two weeks because he hurt his knee.  Plans to start back next week once that completely heals. -Denies hypotensive/hypertensive symptoms -Educated on BP goals and benefits of medications for prevention of heart attack, stroke and kidney damage; Exercise goal of 150 minutes per week; Symptoms of hypotension and importance of maintaining adequate hydration; -Counseled to monitor BP at home if able, document, and provide log at future appointments -Recommended to continue current medication  Hyperlipidemia: (LDL goal < 100) -Controlled -Current treatment: Atorvastatin 1052maily -Medications previously tried: none noted  -Reports adherence with medication, denies any adverse effects -Educated on Cholesterol goals;  -Recommended to continue current medication Due for new lipid panel - physical is normally in February, reminded him to call and scheduled appointment around that time in 2023.  Patient Goals/Self-Care Activities Patient will:  - take medications as prescribed as evidenced by patient  report and record review focus on medication adherence by pill counts  Follow Up Plan: The care management team will reach out to the patient again over the next 180 days.         Medication Assistance: None required.  Patient affirms current coverage meets needs.  Compliance/Adherence/Medication fill history: Care Gaps: None  Star-Rating Drugs: Atorvastatin 03/19/21  Patient's preferred pharmacy is:  [facility]one: [phone]: [phone] (OptO[facility] BranchA [facility]one: [phone]: [address] [place]one: [phone]: [phone] 2767onEast Point MedSur[facility] [place]28one: [phone]: [phone] discussed: Current pharmacy is preferred with insurance plan and patient is satisfied with pharmacy services Patient decided to: Continue current medication management strategy  Care Plan and Follow Up Patient Decision:  Patient agrees to Care Plan and Follow-up.  Plan: The care management team will reach out to the patient again over the next 180 days.  ChrBeverly MilchharmD Clinical Pharmacist  [phone]

## 2021-08-18 ENCOUNTER — Ambulatory Visit (INDEPENDENT_AMBULATORY_CARE_PROVIDER_SITE_OTHER): Payer: Medicare Other

## 2021-08-18 DIAGNOSIS — E785 Hyperlipidemia, unspecified: Secondary | ICD-10-CM

## 2021-08-18 DIAGNOSIS — I10 Essential (primary) hypertension: Secondary | ICD-10-CM

## 2021-08-18 NOTE — Patient Instructions (Addendum)
Visit Information   Goals Addressed             This Visit's Progress    Track and Manage My Blood Pressure-Hypertension       Timeframe:  Long-Range Goal Priority:  High Start Date: 08/18/21                            Expected End Date:  02/15/22                     Follow Up Date 11/18/21    Track blood pressure when able, watch for symptoms of high/low blood pressure (Headache, dizziness)    Why is this important?   You won't feel high blood pressure, but it can still hurt your blood vessels.  High blood pressure can cause heart or kidney problems. It can also cause a stroke.  Making lifestyle changes like losing a little weight or eating less salt will help.  Checking your blood pressure at home and at different times of the day can help to control blood pressure.  If the doctor prescribes medicine remember to take it the way the doctor ordered.  Call the office if you cannot afford the medicine or if there are questions about it.     Notes:        Patient Care Plan: General Pharmacy (Adult)     Problem Identified: HTN, HLD   Priority: High  Onset Date: 08/18/2021     Long-Range Goal: Patient-Specific Goal   Start Date: 08/18/2021  Expected End Date: 02/15/2022  This Visit's Progress: On track  Priority: High  Note:   Current Barriers:  None at this time  Pharmacist Clinical Goal(s):  Patient will maintain control of cholesterol and BP as evidenced by monitoring/labs  through collaboration with PharmD and provider.   Interventions: 1:1 collaboration with Marin Olp, MD regarding development and update of comprehensive plan of care as evidenced by provider attestation and co-signature Inter-disciplinary care team collaboration (see longitudinal plan of care) Comprehensive medication review performed; medication list updated in electronic medical record  Hypertension (BP goal <140/90) -Controlled -Current treatment: Valsartan 80mg  daily HCTZ 12.5mg   daily -Medications previously tried: none noted  -Current home readings: not checking -Current exercise habits: normally works out at [employer] 5 days per week, has not done so for the past two weeks because he hurt his knee.  Plans to start back next week once that completely heals. -Denies hypotensive/hypertensive symptoms -Educated on BP goals and benefits of medications for prevention of heart attack, stroke and kidney damage; Exercise goal of 150 minutes per week; Symptoms of hypotension and importance of maintaining adequate hydration; -Counseled to monitor BP at home if able, document, and provide log at future appointments -Recommended to continue current medication  Hyperlipidemia: (LDL goal < 100) -Controlled -Current treatment: Atorvastatin 10mg  daily -Medications previously tried: none noted  -Reports adherence with medication, denies any adverse effects -Educated on Cholesterol goals;  -Recommended to continue current medication Due for new lipid panel - physical is normally in February, reminded him to call and scheduled appointment around that time in 2023.  Patient Goals/Self-Care Activities Patient will:  - take medications as prescribed as evidenced by patient report and record review focus on medication adherence by pill counts  Follow Up Plan: The care management team will reach out to the patient again over the next 180 days.  Patient verbalizes understanding of instructions provided today and agrees to view in [place].  Telephone follow up appointment with pharmacy team member scheduled for: 6 months  Edythe Clarity, [place]

## 2021-09-09 DIAGNOSIS — I10 Essential (primary) hypertension: Secondary | ICD-10-CM

## 2021-09-09 DIAGNOSIS — E785 Hyperlipidemia, unspecified: Secondary | ICD-10-CM

## 2021-11-03 ENCOUNTER — Other Ambulatory Visit: Payer: Self-pay | Admitting: Urology

## 2021-11-03 DIAGNOSIS — C61 Malignant neoplasm of prostate: Secondary | ICD-10-CM

## 2021-11-18 ENCOUNTER — Ambulatory Visit
Admission: RE | Admit: 2021-11-18 | Discharge: 2021-11-18 | Disposition: A | Discharge status: Home / Self Care | Payer: Medicare Other | Source: Ambulatory Visit | Attending: Urology | Admitting: Urology

## 2021-11-18 DIAGNOSIS — C61 Malignant neoplasm of prostate: Secondary | ICD-10-CM

## 2021-11-18 MED ORDER — GADOBENATE DIMEGLUMINE 529 MG/ML IV SOLN
20.0000 mL | Freq: Once | INTRAVENOUS | Status: AC | PRN
  Administered 2021-11-18: 20 mL via INTRAVENOUS

## 2021-12-01 ENCOUNTER — Encounter: Payer: Self-pay | Admitting: Family Medicine

## 2021-12-16 DIAGNOSIS — R350 Frequency of micturition: Secondary | ICD-10-CM | POA: Diagnosis not present

## 2021-12-17 ENCOUNTER — Other Ambulatory Visit: Payer: Self-pay | Admitting: Family Medicine

## 2022-02-16 NOTE — Progress Notes (Signed)
? ?Chronic Care Management ?Pharmacy Note ? ?02/24/2022 ?Name:  Caleb Horton MRN:  [id number] DOB:  Nov 20, 1945 ? ?Summary: ?PharmD follow up.  Patient doing great.  He is due for physical so I asked him to call and make that appointment. ? ?Recommendations/Changes made from today's visit: ?Call and schedule physical ? ?Plan: ?FU as needed ? ? ?Subjective: ?Caleb Horton is an 76 y.o. year old male who is a primary patient of Hunter, Brayton Mars, MD.  The CCM team was consulted for assistance with disease management and care coordination needs.   ? ?Engaged with patient by telephone for follow up visit in response to provider referral for pharmacy case management and/or care coordination services.  ? ?Consent to Services:  ?The patient was given the following information about Chronic Care Management services today, agreed to services, and gave verbal consent: 1. CCM service includes personalized support from designated clinical staff supervised by the primary care provider, including individualized plan of care and coordination with other care providers 2. 24/7 contact phone numbers for assistance for urgent and routine care needs. 3. Service will only be billed when office clinical staff spend 20 minutes or more in a month to coordinate care. 4. Only one practitioner may furnish and bill the service in a calendar month. 5.The patient may stop CCM services at any time (effective at the end of the month) by phone call to the office staff. 6. The patient will be responsible for cost sharing (co-pay) of up to 20% of the service fee (after annual deductible is met). Patient agreed to services and consent obtained. ? ?Patient Care Team: ?Marin Olp, MD as PCP - General (Family Medicine) ?Buford Dresser, MD as PCP - Cardiology (Cardiology) ?Buford Dresser, MD as Consulting Physician (Cardiology) ?Edythe Clarity, [facility] (Pharmacist) ? ?Recent office visits:  ?None ?  ?Recent consult visits:   ?None ?  ?Hospital visits:  ?None in previous 6 months ? ? ?Objective: ? ?Lab Results  ?Component Value Date  ? CREATININE 1.33 11/13/2020  ? BUN 17 11/13/2020  ? GFR 53.07 (L) 11/13/2020  ? GFRNONAA 37 (L) 04/19/2018  ? GFRAA 43 (L) 04/19/2018  ? NA 136 11/13/2020  ? K 3.5 11/13/2020  ? CALCIUM 10.4 11/13/2020  ? CO2 29 11/13/2020  ? GLUCOSE 107 (H) 11/13/2020  ? ? ?Lab Results  ?Component Value Date/Time  ? HGBA1C 5.8 11/13/2020 10:34 AM  ? HGBA1C 5.8 (H) 05/06/2020 03:46 PM  ? HGBA1C 6.6 02/01/2020 12:00 AM  ? GFR 53.07 (L) 11/13/2020 10:34 AM  ? GFR 70.61 08/29/2019 10:29 AM  ? MICROALBUR 0.8 05/17/2014 11:06 AM  ?  ?Last diabetic Eye exam: No results found for: HMDIABEYEEXA  ?Last diabetic Foot exam: No results found for: HMDIABFOOTEX  ? ?Lab Results  ?Component Value Date  ? CHOL 146 05/06/2020  ? HDL 39 (L) 05/06/2020  ? [place] 82 05/06/2020  ? LDLDIRECT 81.0 11/13/2020  ? TRIG 155 (H) 05/06/2020  ? CHOLHDL 3.7 05/06/2020  ? ? ? ?  Latest Ref Rng & Units 11/13/2020  ? 10:34 AM 05/06/2020  ?  3:46 PM 08/29/2019  ? 10:29 AM  ?Hepatic Function  ?Total Protein 6.0 - 8.3 g/dL 9.2   8.6   9.1    ?Albumin 3.5 - 5.2 g/dL 3.9    4.0    ?AST 0 - 37 U/L _0 ?ALT 0 - 53 U/L _1 ?  Alk Phosphatase 39 - 117 U/L 67    76    ?Total Bilirubin 0.2 - 1.2 mg/dL 0.6   0.6   0.5    ? ? ?Lab Results  ?Component Value Date/Time  ? TSH 2.49 04/21/2018 05:19 PM  ? TSH 3.10 02/22/2018 08:23 AM  ? ? ? ?  Latest Ref Rng & Units 11/13/2020  ? 10:34 AM 05/06/2020  ?  3:46 PM 08/29/2019  ? 10:29 AM  ?CBC  ?WBC 4.0 - 10.5 K/uL 5.7   7.3   6.7    ?Hemoglobin 13.0 - 17.0 g/dL 12.8   12.5   13.3    ?Hematocrit 39.0 - 52.0 % 38.0   38.2   39.8    ?Platelets 150.0 - 400.0 K/uL 158.0   152   136.0    ? ? ?No results found for: VD25OH ? ?Clinical ASCVD: No  ?The 10-year ASCVD risk score (Arnett DK, et al., 2019) is: 23.7% ?  Values used to calculate the score: ?    Age: 81 years ?    Sex: Male ?    Is Non-Hispanic African American:  Yes ?    Diabetic: No ?    Tobacco smoker: No ?    Systolic Blood Pressure: 163 mmHg ?    Is BP treated: Yes ?    HDL Cholesterol: 39 mg/dL ?    Total Cholesterol: 146 mg/dL   ? ? ?  07/16/2021  ? 11:59 AM 11/13/2020  ?  9:48 AM 07/10/2020  ? 11:54 AM  ?Depression screen PHQ 2/9  ?Decreased Interest 0 0 0  ?Down, Depressed, Hopeless 0 0 0  ?PHQ - 2 Score 0 0 0  ?  ? ?Social History  ? ?Tobacco Use  ?Smoking Status Former  ? Packs/day: 0.50  ? Years: 3.00  ? Pack years: 1.50  ? Types: Cigarettes  ? Quit date: 10/11/1968  ? Years since quitting: 53.4  ?Smokeless Tobacco Never  ? ?BP Readings from Last 3 Encounters:  ?08/12/21 137/74  ?07/16/21 120/72  ?11/13/20 124/74  ? ?Pulse Readings from Last 3 Encounters:  ?08/12/21 88  ?07/16/21 95  ?11/13/20 85  ? ?Wt Readings from Last 3 Encounters:  ?08/12/21 229 lb 15 oz (104.3 kg)  ?07/16/21 230 lb (104.3 kg)  ?11/13/20 235 lb (106.6 kg)  ? ?BMI Readings from Last 3 Encounters:  ?08/12/21 31.19 kg/m?  ?07/16/21 31.19 kg/m?  ?11/13/20 31.87 kg/m?  ? ? ?Assessment/Interventions: Review of patient past medical history, allergies, medications, health status, including review of consultants reports, laboratory and other test data, was performed as part of comprehensive evaluation and provision of chronic care management services.  ? ?SDOH:  (Social Determinants of Health) assessments and interventions performed: Yes ? ?Financial Resource Strain: Low Risk   ? Difficulty of Paying Living Expenses: Not hard at all  ? ? ?SDOH Screenings  ? ?Alcohol Screen: Not on file  ?Depression (PHQ2-9): Low Risk   ? PHQ-2 Score: 0  ?Financial Resource Strain: Low Risk   ? Difficulty of Paying Living Expenses: Not hard at all  ?Food Insecurity: No Food Insecurity  ? Worried About Charity fundraiser in the Last Year: Never true  ? Ran Out of Food in the Last Year: Never true  ?Housing: Low Risk   ? Last Housing Risk Score: 0  ?Physical Activity: Sufficiently Active  ? Days of Exercise per Week: 6 days   ? Minutes of Exercise per Session: 50 min  ?Social Connections:  Moderately Integrated  ? Frequency of Communication with Friends and Family: More than three times a week  ? Frequency of Social Gatherings with Friends and Family: More than three times a week  ? Attends Religious Services: Never  ? Active Member of Clubs or Organizations: Yes  ? Attends Archivist Meetings: 1 to 4 times per year  ? Marital Status: Married  ?Stress: No Stress Concern Present  ? Feeling of Stress : Not at all  ?Tobacco Use: Medium Risk  ? Smoking Tobacco Use: Former  ? Smokeless Tobacco Use: Never  ? Passive Exposure: Not on file  ?Transportation Needs: No Transportation Needs  ? Lack of Transportation (Medical): No  ? Lack of Transportation (Non-Medical): No  ? ? ?CCM Care Plan ? ?No Known Allergies ? ?Medications Reviewed Today   ? ? Reviewed by Edythe Clarity, RPH (Pharmacist) on 02/23/22 at [place] List Status: <None>  ? ?Medication Order Taking? Sig Documenting Provider Last Dose Status Informant  ?atorvastatin (LIPITOR) 10 MG tablet [id number] Yes TAKE 1 TABLET BY MOUTH  DAILY Marin Olp, MD Taking Active   ?COVID-19 mRNA bivalent vaccine, Pfizer, (PFIZER COVID-19 VAC BIVALENT) injection [id number] Yes Inject into the muscle. Carlyle Basques, MD Taking Active   ?COVID-19 mRNA Vac-TriS, Pfizer, (PFIZER-BIONT COVID-19 VAC-TRIS) SUSP injection [id number] Yes Inject into the muscle. Carlyle Basques, MD Taking Active   ?hydrochlorothiazide (HYDRODIURIL) 12.5 MG tablet [id number] Yes TAKE 1 TABLET BY MOUTH  DAILY Marin Olp, MD Taking Active   ?influenza vaccine adjuvanted (FLUAD) 0.5 ML injection [id number] Yes Inject into the muscle. Carlyle Basques, MD Taking Active   ?Omega-3 Fatty Acids (FISH OIL) 1000 MG CAPS [id number] Yes Take by mouth. [provider] Taking Active   ?triamcinolone ointment (KENALOG) 0.5 % [id number] Yes Apply 1 application topically 2 (two) times daily. Up to 7 days as needed  for skin irritation such as poison ivy Marin Olp, MD Taking Active   ?valsartan (DIOVAN) 80 MG tablet [id number] Yes TAKE 2 TABLETS BY MOUTH  DAILY Marin Olp, MD Taking Active   ? ?  ?  ? ?

## 2022-02-23 ENCOUNTER — Ambulatory Visit: Payer: Medicare Other | Admitting: Pharmacist

## 2022-02-23 ENCOUNTER — Ambulatory Visit: Payer: Medicare Other | Attending: Internal Medicine

## 2022-02-23 ENCOUNTER — Other Ambulatory Visit (HOSPITAL_BASED_OUTPATIENT_CLINIC_OR_DEPARTMENT_OTHER): Payer: Self-pay

## 2022-02-23 DIAGNOSIS — I10 Essential (primary) hypertension: Secondary | ICD-10-CM

## 2022-02-23 DIAGNOSIS — Z23 Encounter for immunization: Secondary | ICD-10-CM

## 2022-02-23 DIAGNOSIS — E785 Hyperlipidemia, unspecified: Secondary | ICD-10-CM

## 2022-02-23 MED ORDER — PFIZER COVID-19 VAC BIVALENT 30 MCG/0.3ML IM SUSP
INTRAMUSCULAR | 0 refills | Status: DC
  Filled 2022-02-23: qty 0.3, 1d supply, fill #0

## 2022-02-23 NOTE — Progress Notes (Signed)
? ?  Covid-19 Vaccination Clinic ? ?Name:  Caleb Horton    ?MRN: [id number] ?DOB: 12-11-1945 ? ?02/23/2022 ? ?[name] was observed post Covid-19 immunization for 15 minutes without incident. He was provided with Vaccine Information Sheet and instruction to access the V-Safe system.  ? ?Mr. Scales was instructed to call 911 with any severe reactions post vaccine: ?Difficulty breathing  ?Swelling of face and throat  ?A fast heartbeat  ?A bad rash all over body  ?Dizziness and weakness  ? ?Immunizations Administered   ? ? Name Date Dose VIS Date Route  ? Ambulance person Booster 02/23/2022  2:45 PM 0.3 mL 06/10/2021 Intramuscular  ? Manufacturer: [place]: [id number]  ? [place]: [phone]  ? ?  ? ? ?

## 2022-02-23 NOTE — Patient Instructions (Addendum)
Visit Information ? ? Goals Addressed   ? ?  ?  ?  ?  ? This Visit's Progress  ?  Track and Manage My Blood Pressure-Hypertension   On track  ?  Timeframe:  Long-Range Goal ?Priority:  High ?Start Date: 08/18/21                            ?Expected End Date:  02/15/22                    ? ?Follow Up Date 11/18/21  ?  ?Track blood pressure when able, watch for symptoms of high/low blood pressure (Headache, dizziness)  ?  ?Why is this important?   ?You won't feel high blood pressure, but it can still hurt your blood vessels.  ?High blood pressure can cause heart or kidney problems. It can also cause a stroke.  ?Making lifestyle changes like losing a little weight or eating less salt will help.  ?Checking your blood pressure at home and at different times of the day can help to control blood pressure.  ?If the doctor prescribes medicine remember to take it the way the doctor ordered.  ?Call the office if you cannot afford the medicine or if there are questions about it.   ?  ?Notes:  ?  ? ?  ? ?Patient Care Plan: General Pharmacy (Adult)  ?  ? ?Problem Identified: HTN, HLD   ?Priority: High  ?Onset Date: 08/18/2021  ?  ? ?Long-Range Goal: Patient-Specific Goal   ?Start Date: 08/18/2021  ?Expected End Date: 02/15/2022  ?Recent Progress: On track  ?Priority: High  ?Note:   ?Current Barriers:  ?None at this time ? ?Pharmacist Clinical Goal(s):  ?Patient will maintain control of cholesterol and BP as evidenced by monitoring/labs  through collaboration with PharmD and provider.  ? ?Interventions: ?1:1 collaboration with Marin Olp, MD regarding development and update of comprehensive plan of care as evidenced by provider attestation and co-signature ?Inter-disciplinary care team collaboration (see longitudinal plan of care) ?Comprehensive medication review performed; medication list updated in electronic medical record ? ?Hypertension (BP goal <140/90) ?-Controlled ?-Current treatment: ?Valsartan '80mg'$  daily Appropriate,  Effective, Safe, Accessible ?HCTZ 12.'5mg'$  daily Appropriate, Effective, Safe, Accessible ?-Medications previously tried: none noted  ?-Current home readings: not checking ?-Current exercise habits: normally works out at [employer] 5 days per week, has not done so for the past two weeks because he hurt his knee.  Plans to start back next week once that completely heals. ?-Denies hypotensive/hypertensive symptoms ?-Educated on BP goals and benefits of medications for prevention of heart attack, stroke and kidney damage; ?Exercise goal of 150 minutes per week; ?Symptoms of hypotension and importance of maintaining adequate hydration; ?-Counseled to monitor BP at home if able, document, and provide log at future appointments ?-Recommended to continue current medication ? ?Update 02/23/22 ?Denies dizziness or HA ?Not monitoring BP at home but has been well controlled in office. ?No changes to medications at this time. ?Continue physical activity! ? ? ?Hyperlipidemia: (LDL goal < 100) ?-Controlled ?-Current treatment: ?Atorvastatin '10mg'$  daily Appropriate, Effective, Safe, Accessible ?-Medications previously tried: none noted  ?-Reports adherence with medication, denies any adverse effects ?-Educated on Cholesterol goals;  ?-Recommended to continue current medication ?Due for new lipid panel - physical is normally in February, reminded him to call and scheduled appointment around that time in 2023. ? ?Update 02/23/22 ?Reminded patient to schedule appointment for fasting labs. ?Still taking  statin but we have not had fasting labs in a few years. ?Patient agreed to call and schedule. ?Will FU prn from now moving forward. ? ?Patient Goals/Self-Care Activities ?Patient will:  ?- take medications as prescribed as evidenced by patient report and record review ?focus on medication adherence by pill counts ? ?Follow Up Plan: The care management team will reach out to the patient again over the next 180 days.  ? ? ?  ? ?  ?  ? ?The  patient verbalized understanding of instructions, educational materials, and care plan provided today and declined offer to receive copy of patient instructions, educational materials, and care plan.  ?Telephone follow up appointment with pharmacy team member scheduled for: as needed ? ?Edythe Clarity, [facility]  ?Beverly Milch, PharmD ?Clinical Pharmacist  ?Caleb Horton ?[phone] ? ?

## 2022-02-24 ENCOUNTER — Other Ambulatory Visit (HOSPITAL_BASED_OUTPATIENT_CLINIC_OR_DEPARTMENT_OTHER): Payer: Self-pay

## 2022-04-20 ENCOUNTER — Other Ambulatory Visit: Payer: Self-pay | Admitting: Family Medicine

## 2022-06-02 DIAGNOSIS — R3915 Urgency of urination: Secondary | ICD-10-CM | POA: Diagnosis not present

## 2022-06-02 DIAGNOSIS — R350 Frequency of micturition: Secondary | ICD-10-CM | POA: Diagnosis not present

## 2022-07-01 ENCOUNTER — Other Ambulatory Visit (INDEPENDENT_AMBULATORY_CARE_PROVIDER_SITE_OTHER): Payer: Medicare Other

## 2022-07-01 ENCOUNTER — Encounter: Payer: Self-pay | Admitting: Family Medicine

## 2022-07-01 ENCOUNTER — Ambulatory Visit (INDEPENDENT_AMBULATORY_CARE_PROVIDER_SITE_OTHER): Payer: Medicare Other | Admitting: Family Medicine

## 2022-07-01 VITALS — BP 108/72 | HR 87 | Temp 98.3°F | Ht 73.0 in | Wt 232.2 lb

## 2022-07-01 DIAGNOSIS — E785 Hyperlipidemia, unspecified: Secondary | ICD-10-CM | POA: Diagnosis not present

## 2022-07-01 DIAGNOSIS — Z0000 Encounter for general adult medical examination without abnormal findings: Secondary | ICD-10-CM | POA: Diagnosis not present

## 2022-07-01 DIAGNOSIS — R739 Hyperglycemia, unspecified: Secondary | ICD-10-CM

## 2022-07-01 DIAGNOSIS — D696 Thrombocytopenia, unspecified: Secondary | ICD-10-CM

## 2022-07-01 DIAGNOSIS — I714 Abdominal aortic aneurysm, without rupture, unspecified: Secondary | ICD-10-CM | POA: Diagnosis not present

## 2022-07-01 LAB — COMPREHENSIVE METABOLIC PANEL
ALT: 23 U/L (ref 0–53)
AST: 23 U/L (ref 0–37)
Albumin: 3.8 g/dL (ref 3.5–5.2)
Alkaline Phosphatase: 73 U/L (ref 39–117)
BUN: 20 mg/dL (ref 6–23)
CO2: 26 mEq/L (ref 19–32)
Calcium: 10.8 mg/dL — ABNORMAL HIGH (ref 8.4–10.5)
Chloride: 103 mEq/L (ref 96–112)
Creatinine, Ser: 1.28 mg/dL (ref 0.40–1.50)
GFR: 54.93 mL/min — ABNORMAL LOW (ref >60.00)
Glucose, Bld: 90 mg/dL (ref 70–99)
Potassium: 3.6 mEq/L (ref 3.5–5.1)
Sodium: 135 mEq/L (ref 135–145)
Total Bilirubin: 0.6 mg/dL (ref 0.2–1.2)
Total Protein: 9.1 g/dL — ABNORMAL HIGH (ref 6.0–8.3)

## 2022-07-01 LAB — CBC WITH DIFFERENTIAL/PLATELET
Basophils Absolute: 0 10*3/uL (ref 0.0–0.1)
Basophils Relative: 0.5 % (ref 0.0–3.0)
Eosinophils Absolute: 0.1 10*3/uL (ref 0.0–0.7)
Eosinophils Relative: 1.8 % (ref 0.0–5.0)
HCT: 37.3 % — ABNORMAL LOW (ref 39.0–52.0)
Hemoglobin: 12.4 g/dL — ABNORMAL LOW (ref 13.0–17.0)
Lymphocytes Relative: 30.5 % (ref 12.0–46.0)
Lymphs Abs: 2 10*3/uL (ref 0.7–4.0)
MCHC: 33.3 g/dL (ref 30.0–36.0)
MCV: 83.8 fl (ref 78.0–100.0)
Monocytes Absolute: 0.8 10*3/uL (ref 0.1–1.0)
Monocytes Relative: 12.4 % — ABNORMAL HIGH (ref 3.0–12.0)
Neutro Abs: 3.6 10*3/uL (ref 1.4–7.7)
Neutrophils Relative %: 54.8 % (ref 43.0–77.0)
Platelets: 137 10*3/uL — ABNORMAL LOW (ref 150.0–400.0)
RBC: 4.45 Mil/uL (ref 4.22–5.81)
RDW: 15.6 % — ABNORMAL HIGH (ref 11.5–15.5)
WBC: 6.5 10*3/uL (ref 4.0–10.5)

## 2022-07-01 LAB — LIPID PANEL
Cholesterol: 131 mg/dL (ref 0–200)
HDL: 46.6 mg/dL (ref >39.00)
LDL Cholesterol: 70 mg/dL (ref 0–99)
NonHDL: 84.88
Total CHOL/HDL Ratio: 3
Triglycerides: 74 mg/dL (ref 0.0–149.0)
VLDL: 14.8 mg/dL (ref 0.0–40.0)

## 2022-07-01 MED ORDER — TRIAMCINOLONE ACETONIDE 0.5 % EX OINT: 1.0000 | TOPICAL_OINTMENT | Freq: Two times a day (BID) | CUTANEOUS | 1 refills | Status: DC

## 2022-07-01 NOTE — Patient Instructions (Addendum)
Let us know when you get your RSV, FLU and COVID vaccines.  Also let us know about shingrix date- we have the one on file from 12/09/21 and just need the other date  Consider hearing test at [facility]- test is free- hearing aids cheaper than other locations  Please stop by lab before you go If you have mychart- we will send your results within 3 business days of [place] receiving them.  If you do not have mychart- we will call you about results within 5 business days of [place] receiving them.  *please also note that you will see labs on mychart as soon as they post. I will later go in and write notes on them- will say "notes from Dr. Yong Channel"   Recommended follow up: Return in about 1 year (around 07/02/2023) for physical or sooner if needed.Schedule b4 you leave.

## 2022-07-01 NOTE — Progress Notes (Signed)
Phone: [phone]   Subjective:  Patient presents today for their annual physical. Chief complaint-noted.   See problem oriented charting- ROS- full  review of systems was completed and negative  except for: numbness- he reports long term issues with neuropathy (have referred to neurology in the past), some knee pain- needs to push up to take pressure off when standing  The following were reviewed and entered/updated in epic: Past Medical History:  Diagnosis Date   Anemia    Hypertension    INGROWN NAIL 11/10/2010   Annotation: bilateral  Qualifier: Diagnosis of  By: Arnoldo Morale MD, John E    Sickle cell trait [facility])    Patient Active Problem List   Diagnosis Date Noted   Prostate cancer ([place]) 08/31/2017    Priority: High   Gammopathy 03/20/2015    Priority: High   AAA (abdominal aortic aneurysm) ([place]) 03/17/2017    Priority: Medium    Idiopathic neuropathy 02/07/2017    Priority: Medium    Hyperlipidemia 09/04/2014    Priority: Medium    CKD (chronic kidney disease), 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    Priority: Medium    Essential hypertension 11/27/2007    Priority: Medium    History of adenomatous polyp of colon 08/05/2015    Priority: Low   Elevated blood protein 02/04/2015    Priority: Low   Hyperglycemia 11/27/2007    Priority: Low   Sickle-cell trait ([place]) 11/27/2007    Priority: Low   BPH (benign prostatic hyperplasia) 11/27/2007    Priority: Low   Morbid obesity ([place]) 07/01/2022   Thrombocytopenia ([place]) 09/19/2015   Past Surgical History:  Procedure Laterality Date   CIRCUMCISION     age 61   COLONOSCOPY      Family History  Problem Relation Age of Onset   Hypertension Father    Heart disease Father    Hearing loss Mother    Heart disease Daughter    Obesity Daughter    Obesity Son    Diabetes Maternal Grandmother    Colon cancer Neg Hx    Esophageal cancer Neg Hx    Stomach cancer Neg Hx    Rectal cancer Neg Hx     Medications- reviewed and  updated Current Outpatient Medications  Medication Sig Dispense Refill   atorvastatin (LIPITOR) 10 MG tablet TAKE 1 TABLET BY MOUTH  DAILY 90 tablet 3   hydrochlorothiazide (HYDRODIURIL) 12.5 MG tablet TAKE 1 TABLET BY MOUTH  DAILY 90 tablet 3   Omega-3 Fatty Acids (FISH OIL) 1000 MG CAPS Take by mouth.     triamcinolone ointment (KENALOG) 0.5 % Apply 1 Application topically 2 (two) times daily. Up to 7 days as needed for skin irritation such as poison ivy 60 g 1   valsartan (DIOVAN) 80 MG tablet TAKE 2 TABLETS BY MOUTH  DAILY 180 tablet 3   No current facility-administered medications for this visit.    Allergies-reviewed and updated No Known Allergies  Social History   Social History Narrative   Married (3rd marriage). 5 children (3 from 1st, 2 from second), 7 grandkids.       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 He lives with his wife in a 2 story house. He drinks one cup of coffee QOD, and occasionally drinks tea.   Objective  Objective:  BP 108/72 (BP Location: Left Arm, Patient Position: Sitting, Cuff Size: Large)   Pulse  87   Temp 98.3 F (36.8 C) (Temporal)   Ht '6\' 1"'$  (1.854 m)   Wt 232 lb 3.2 oz (105.3 kg)   SpO2 96%   BMI 30.64 kg/m  Gen: NAD, resting comfortably HEENT: Mucous membranes are moist. Oropharynx normal Neck: no thyromegaly CV: RRR no murmurs rubs or gallops Lungs: CTAB no crackles, wheeze, rhonchi Abdomen: soft/nontender/nondistended/normal bowel sounds. No rebound or guarding.  Ext: no edema Skin: warm, dry Neuro: grossly normal, moves all extremities, PERRLA   Assessment and Plan  76 y.o. male presenting for annual physical.  Health Maintenance counseling: 1. Anticipatory guidance: Patient counseled regarding regular dental exams -q6 months, eye exams -yearly,  avoiding smoking and second hand smoke , limiting alcohol to 2 beverages per day,  no illicit drugs  .   2. Risk factor reduction:  Advised patient of need for regular exercise and diet rich and fruits and vegetables to reduce risk of heart attack and stroke.  Exercise- still going to [facility] for exercise-6 days a week .  Diet/weight management-able to maintain weight loss from last year and down another 3 pounds-cut down on snacking previously.  As high as 259 in May 2020.  Wt Readings from Last 3 Encounters:  07/01/22 232 lb 3.2 oz (105.3 kg)  08/12/21 229 lb 15 oz (104.3 kg)  07/16/21 230 lb (104.3 kg)  3. Immunizations/screenings/ancillary studies- flu shot - see below , shingrix - wil get [place] date of other shot , covid 19 newest immunization - see below  - wants to do RSV, flu, new covid all together at pharmacy Immunization History  Administered Date(s) Administered   Fluad Quad(high Dose 65+) 06/14/2019, 09/15/2020   Influenza Split 08/15/2012   Influenza, High Dose Seasonal PF 07/14/2013, 07/05/2014   Influenza-Unspecified 07/20/2015, 07/26/2018   PFIZER Comirnaty(Gray Top)Covid-19 Tri-Sucrose Vaccine 02/09/2021   PFIZER(Purple Top)SARS-COV-2 Vaccination 12/04/2019, 12/25/2019, 07/15/2020   Pfizer Covid-19 Vaccine Bivalent Booster 20yr & up 08/12/2021, 02/23/2022   Pneumococcal Conjugate-13 07/05/2014   Pneumococcal Polysaccharide-23 02/12/2013   Td 10/11/2005   Tdap 03/17/2018   Zoster Recombinat (Shingrix) 12/09/2021   Zoster, Live 02/04/2015  4. Prostate cancer screening- prostate cancer- under active surveillance with urology-they draw PSAs . Considering medicine to help with urinary frequency.  In august was at 5.59 slightly down form prior check with 6 month follow up needed Lab Results  Component Value Date   PSA 4.97 04/03/2020   PSA 3.78 08/29/2019   PSA 5.24 (H) 02/24/2018   5. Colon cancer screening - history adenomatous colon polyps October 29, 2020 with 5-year repeat planned 6. Skin cancer screening-low risk due to melanin content. advised regular sunscreen use.  Denies worrisome, changing, or new skin lesions- other than possible eczema on left lower leg 7. Smoking associated screening (lung cancer screening, AAA screen 65-75, UA)-former smoker-no regular screening other than AAA discussion below 8. STD screening - only active with wife so not needed  Status of chronic or acute concerns   #gammopathy/thrombocytopenia - released from hematology in 2017. We continue to monitor CBC with diff- does have mild anemia,  Also with thrombocytopenia which has been stable- will monitor today Lab Results  Component Value Date   WBC 5.7 11/13/2020   HGB 12.8 (L) 11/13/2020   HCT 38.0 (L) 11/13/2020   MCV 84.5 11/13/2020   PLT 158.0 11/13/2020    #hyperlipidemia S: Medication: Atorvastatin '10mg'$   Lab Results  Component Value Date   CHOL 146 05/06/2020   HDL 39 (L) 05/06/2020  LDLCALC 82 05/06/2020   LDLDIRECT 81.0 11/13/2020   TRIG 155 (H) 05/06/2020   CHOLHDL 3.7 05/06/2020  A/P: close to ideal goal- hesitant to increase meds further due to prediabiates  #hypertension S: medication: HCTZ 12.'5Mg'$ , valsartan '160Mg'$  BP Readings from Last 3 Encounters:  07/01/22 108/72  08/12/21 137/74  07/16/21 120/72  A/P: Controlled. Continue current medications.   # Hyperglycemia/insulin resistance/prediabetes- peak a1c 6.6 x1 with at home test, otherwise lower than that S:  Medication:  none Exercise and diet-  works out  at [employer] Value Date   HGBA1C 5.8 11/13/2020   HGBA1C 5.8 (H) 05/06/2020   HGBA1C 6.6 02/01/2020  A/P: hopefully stable or improved with weight loss- update a1c today. Continue without meds for now  #AAA screening- noted on initial screen but not on repeat- have contacted radiologist in past and they stated no further follow up needed  -no abdominal pain- we opted to not recheck at this time  #sickle cell trait - noted- no issues- monitor  #skin irritation- scaly slightly hyperpigmented patch - wants to try  triamcinolone which was worked int he past for similar in other areas- possible eczema- refilled this  Recommended follow up: Return in about 1 year (around 07/02/2023) for physical or sooner if needed.Schedule b4 you leave. Future Appointments  Date Time Provider [place]  07/29/2022 11:45 AM [facility]   Lab/Order associations:   ICD-10-CM   1. Preventative health care  Z00.00     2. Morbid obesity ([place])  E66.01     3. Abdominal aortic aneurysm (AAA) without rupture, unspecified part ([place]) Chronic I71.40     4. Thrombocytopenia ([facility]) Chronic D69.6     5. Hyperlipidemia, unspecified hyperlipidemia type  E78.5 CBC with Differential/Platelet    Comprehensive metabolic panel    Lipid panel    6. Hyperglycemia  R73.9 HgB A1c     Meds ordered this encounter  Medications   triamcinolone ointment (KENALOG) 0.5 %    Sig: Apply 1 Application topically 2 (two) times daily. Up to 7 days as needed for skin irritation such as poison ivy    Dispense:  60 g    Refill:  1    Return precautions advised.  Garret Reddish, MD

## 2022-07-02 ENCOUNTER — Other Ambulatory Visit: Payer: Self-pay

## 2022-07-05 ENCOUNTER — Encounter: Payer: Self-pay | Admitting: *Deleted

## 2022-07-05 LAB — HEMOGLOBIN A1C: Hgb A1c MFr Bld: 5.9 % (ref 4.6–6.5)

## 2022-07-07 ENCOUNTER — Telehealth: Payer: Self-pay | Admitting: Family Medicine

## 2022-07-07 NOTE — Telephone Encounter (Signed)
Vaccines updated

## 2022-07-07 NOTE — Telephone Encounter (Signed)
Pt states: -Shingles shots received: 12/09/21 02/17/22 -shots administered at [facility] -he thinks it was called Shingrix

## 2022-07-09 ENCOUNTER — Other Ambulatory Visit (HOSPITAL_BASED_OUTPATIENT_CLINIC_OR_DEPARTMENT_OTHER): Payer: Self-pay

## 2022-07-09 MED ORDER — AREXVY 120 MCG/0.5ML IM SUSR
INTRAMUSCULAR | 0 refills | Status: DC
  Filled 2022-07-09: qty 0.5, 1d supply, fill #0

## 2022-07-13 ENCOUNTER — Other Ambulatory Visit (INDEPENDENT_AMBULATORY_CARE_PROVIDER_SITE_OTHER): Payer: Medicare Other

## 2022-07-13 DIAGNOSIS — R7989 Other specified abnormal findings of blood chemistry: Secondary | ICD-10-CM

## 2022-07-13 LAB — BASIC METABOLIC PANEL
BUN: 24 mg/dL — ABNORMAL HIGH (ref 6–23)
CO2: 24 mEq/L (ref 19–32)
Calcium: 10.5 mg/dL (ref 8.4–10.5)
Chloride: 101 mEq/L (ref 96–112)
Creatinine, Ser: 1.6 mg/dL — ABNORMAL HIGH (ref 0.40–1.50)
GFR: 42.02 mL/min — ABNORMAL LOW (ref >60.00)
Glucose, Bld: 114 mg/dL — ABNORMAL HIGH (ref 70–99)
Potassium: 3.7 mEq/L (ref 3.5–5.1)
Sodium: 131 mEq/L — ABNORMAL LOW (ref 135–145)

## 2022-07-14 LAB — PARATHYROID HORMONE, INTACT (NO CA): PTH: 45 pg/mL (ref 16–77)

## 2022-07-29 ENCOUNTER — Ambulatory Visit (INDEPENDENT_AMBULATORY_CARE_PROVIDER_SITE_OTHER): Payer: Medicare Other

## 2022-07-29 VITALS — BP 126/68 | HR 95 | Temp 98.2°F | Wt 228.8 lb

## 2022-07-29 DIAGNOSIS — Z0000 Encounter for general adult medical examination without abnormal findings: Secondary | ICD-10-CM

## 2022-07-29 NOTE — Patient Instructions (Signed)
Caleb Horton , Thank you for taking time to come for your Medicare Wellness Visit. I appreciate your ongoing commitment to your health goals. Please review the following plan we discussed and let me know if I can assist you in the future.   These are the goals we discussed:  Goals      patient     To fup on foot numbness; stops him from bowling; difficulty sliding      Patient Stated     Lose weight     Patient Stated     Lose weight      Caleb Horton (see longitudinal plan of care for additional care plan information)  Current Barriers:  Chronic Disease Management support, education, and care coordination needs related to Hypertension and Hyperlipidemia   Hypertension BP Readings from Last 3 Encounters:  05/06/20 122/78  08/29/19 128/66  06/14/19 128/72  Pharmacist Clinical Goal(s): Over the next 365 days, patient will work with PharmD and providers to maintain BP goal <130/80 Current regimen:  Valsartan 160 mg once daily Hydrochlorothiazide 12.5 mg once daily Interventions: Reviewed side effects - no issues noted Reviewed diet/exercise - Maintain a healthy weight and exercise regularly, as directed by your health care provider. Eat healthy foods, such as: Lean proteins, complex carbohydrates, fresh fruits and vegetables, low-fat dairy products, healthy fats. Patient self care activities - Over the next 365 days, patient will: Check BP at least once every 1-2 weeks, document, and provide at future appointments Ensure daily salt intake < 2300 mg/day  Hyperlipidemia Lab Results  Component Value Date/Time   LDLCALC 82 05/06/2020 03:46 PM   LDLDIRECT 76.0 10/18/2018 10:54 AM  Pharmacist Clinical Goal(s): Over the next 365 days, patient will work with PharmD and providers to achieve LDL goal < 70 Current regimen:  Atorvastatin 10 mg once daily  Interventions: Reviewed diet/exercise - see hypertension Patient self care activities - Over the next  365 days, patient will: Continue current management  Medication management Pharmacist Clinical Goal(s): Over the next 365 days, patient will work with PharmD and providers to maintain optimal medication adherence Current pharmacy: [facility] Interventions Comprehensive medication review performed. Continue current medication management strategy Patient self care activities - Over the next 365 days, patient will: Take medications as prescribed Report any questions or concerns to PharmD and/or provider(s) Initial goal documentation.     Track and Manage My Blood Pressure-Hypertension     Timeframe:  Long-Range Goal Priority:  High Start Date: 08/18/21                            Expected End Date:  02/15/22                     Follow Up Date 11/18/21    Track blood pressure when able, watch for symptoms of high/low blood pressure (Headache, dizziness)    Why is this important?   You won't feel high blood pressure, but it can still hurt your blood vessels.  High blood pressure can cause heart or kidney problems. It can also cause a stroke.  Making lifestyle changes like losing a little weight or eating less salt will help.  Checking your blood pressure at home and at different times of the day can help to control blood pressure.  If the doctor prescribes medicine remember to take it the way the doctor ordered.  Call the  office if you cannot afford the medicine or if there are questions about it.     Notes:      Weight (lb) < 200 lb (90.7 kg)     Patient would at least like to lose 25lbs in the next year. We discussed losing 2-3lbs a month to make a lifestyle change versus a quick weight loss and gaining weight back        This is a list of the screening recommended for you and due dates:  Health Maintenance  Topic Date Due   COVID-19 Vaccine (7 - Pfizer risk series) 04/20/2022   Flu Shot  05/11/2022   Hepatitis C Screening: USPSTF Recommendation to screen - Ages  18-79 yo.  10/08/2098*   Colon Cancer Screening  10/29/2025   Tetanus Vaccine  03/17/2028   Pneumonia Vaccine  Completed   Zoster (Shingles) Vaccine  Completed   HPV Vaccine  Aged Out  *Topic was postponed. The date shown is not the original due date.    Advanced directives: Please bring a copy of your health care power of attorney and living will to the office at your convenience.  Conditions/risks identified: lose weight   Next appointment: Follow up in one year for your annual wellness visit.   Preventive Care 80 Years and Older, Male  Preventive care refers to lifestyle choices and visits with your health care provider that can promote health and wellness. What does preventive care include? A yearly physical exam. This is also called an annual well check. Dental exams once or twice a year. Routine eye exams. Ask your health care provider how often you should have your eyes checked. Personal lifestyle choices, including: Daily care of your teeth and gums. Regular physical activity. Eating a healthy diet. Avoiding tobacco and drug use. Limiting alcohol use. Practicing safe sex. Taking low doses of aspirin every day. Taking vitamin and mineral supplements as recommended by your health care provider. What happens during an annual well check? The services and screenings done by your health care provider during your annual well check will depend on your age, overall health, lifestyle risk factors, and family history of disease. Counseling  Your health care provider may ask you questions about your: Alcohol use. Tobacco use. Drug use. Emotional well-being. Home and relationship well-being. Sexual activity. Eating habits. History of falls. Memory and ability to understand (cognition). Work and work Statistician. Screening  You may have the following tests or measurements: Height, weight, and BMI. Blood pressure. Lipid and cholesterol levels. These may be checked every 5  years, or more frequently if you are over 57 years old. Skin check. Lung cancer screening. You may have this screening every year starting at age 50 if you have a 30-pack-year history of smoking and currently smoke or have quit within the past 15 years. Fecal occult blood test (FOBT) of the stool. You may have this test every year starting at age 70. Flexible sigmoidoscopy or colonoscopy. You may have a sigmoidoscopy every 5 years or a colonoscopy every 10 years starting at age 22. Prostate cancer screening. Recommendations will vary depending on your family history and other risks. Hepatitis C blood test. Hepatitis B blood test. Sexually transmitted disease (STD) testing. Diabetes screening. This is done by checking your blood sugar (glucose) after you have not eaten for a while (fasting). You may have this done every 1-3 years. Abdominal aortic aneurysm (AAA) screening. You may need this if you are a current or former smoker. Osteoporosis. You may  be screened starting at age 60 if you are at high risk. Talk with your health care provider about your test results, treatment options, and if necessary, the need for more tests. Vaccines  Your health care provider may recommend certain vaccines, such as: Influenza vaccine. This is recommended every year. Tetanus, diphtheria, and acellular pertussis (Tdap, Td) vaccine. You may need a Td booster every 10 years. Zoster vaccine. You may need this after age 54. Pneumococcal 13-valent conjugate (PCV13) vaccine. One dose is recommended after age 45. Pneumococcal polysaccharide (PPSV23) vaccine. One dose is recommended after age 48. Talk to your health care provider about which screenings and vaccines you need and how often you need them. This information is not intended to replace advice given to you by your health care provider. Make sure you discuss any questions you have with your health care provider. Document Released: 10/24/2015 Document Revised:  06/16/2016 Document Reviewed: 07/29/2015 Elsevier Interactive Patient Education  2017 [place] Prevention in the Home Falls can cause injuries. They can happen to people of all ages. There are many things you can do to make your home safe and to help prevent falls. What can I do on the outside of my home? Regularly fix the edges of walkways and driveways and fix any cracks. Remove anything that might make you trip as you walk through a door, such as a raised step or threshold. Trim any bushes or trees on the path to your home. Use bright outdoor lighting. Clear any walking paths of anything that might make someone trip, such as rocks or tools. Regularly check to see if handrails are loose or broken. Make sure that both sides of any steps have handrails. Any raised decks and porches should have guardrails on the edges. Have any leaves, snow, or ice cleared regularly. Use sand or salt on walking paths during winter. Clean up any spills in your garage right away. This includes oil or grease spills. What can I do in the bathroom? Use night lights. Install grab bars by the toilet and in the tub and shower. Do not use towel bars as grab bars. Use non-skid mats or decals in the tub or shower. If you need to sit down in the shower, use a plastic, non-slip stool. Keep the floor dry. Clean up any water that spills on the floor as soon as it happens. Remove soap buildup in the tub or shower regularly. Attach bath mats securely with double-sided non-slip rug tape. Do not have throw rugs and other things on the floor that can make you trip. What can I do in the bedroom? Use night lights. Make sure that you have a light by your bed that is easy to reach. Do not use any sheets or blankets that are too big for your bed. They should not hang down onto the floor. Have a firm chair that has side arms. You can use this for support while you get dressed. Do not have throw rugs and other things  on the floor that can make you trip. What can I do in the kitchen? Clean up any spills right away. Avoid walking on wet floors. Keep items that you use a lot in easy-to-reach places. If you need to reach something above you, use a strong step stool that has a grab bar. Keep electrical cords out of the way. Do not use floor polish or wax that makes floors slippery. If you must use wax, use non-skid floor wax. Do not  have throw rugs and other things on the floor that can make you trip. What can I do with my stairs? Do not leave any items on the stairs. Make sure that there are handrails on both sides of the stairs and use them. Fix handrails that are broken or loose. Make sure that handrails are as long as the stairways. Check any carpeting to make sure that it is firmly attached to the stairs. Fix any carpet that is loose or worn. Avoid having throw rugs at the top or bottom of the stairs. If you do have throw rugs, attach them to the floor with carpet tape. Make sure that you have a light switch at the top of the stairs and the bottom of the stairs. If you do not have them, ask someone to add them for you. What else can I do to help prevent falls? Wear shoes that: Do not have high heels. Have rubber bottoms. Are comfortable and fit you well. Are closed at the toe. Do not wear sandals. If you use a stepladder: Make sure that it is fully opened. Do not climb a closed stepladder. Make sure that both sides of the stepladder are locked into place. Ask someone to hold it for you, if possible. Clearly mark and make sure that you can see: Any grab bars or handrails. First and last steps. Where the edge of each step is. Use tools that help you move around (mobility aids) if they are needed. These include: Canes. Walkers. Scooters. Crutches. Turn on the lights when you go into a dark area. Replace any light bulbs as soon as they burn out. Set up your furniture so you have a clear path. Avoid  moving your furniture around. If any of your floors are uneven, fix them. If there are any pets around you, be aware of where they are. Review your medicines with your doctor. Some medicines can make you feel dizzy. This can increase your chance of falling. Ask your doctor what other things that you can do to help prevent falls. This information is not intended to replace advice given to you by your health care provider. Make sure you discuss any questions you have with your health care provider. Document Released: 07/24/2009 Document Revised: 03/04/2016 Document Reviewed: 11/01/2014 Elsevier Interactive Patient Education  2017 [employer].

## 2022-07-29 NOTE — Progress Notes (Signed)
Subjective:   Caleb Horton is a 76 y.o. male who presents for Medicare Annual/Subsequent preventive examination.  Review of Systems     Cardiac Risk Factors include: advanced age (>23mn, >>59women);hypertension;dyslipidemia;male gender;obesity (BMI >30kg/m2)     Objective:    Today's Vitals   07/29/22 1145  BP: 126/68  Pulse: 95  Temp: 98.2 F (36.8 C)  SpO2: 94%  Weight: 228 lb 12.8 oz (103.8 kg)   Body mass index is 30.19 kg/m.     07/29/2022   11:53 AM 08/12/2021    1:46 PM 07/16/2021   12:00 PM 07/10/2020   11:56 AM 06/14/2019    3:26 PM 05/23/2018    3:22 PM 02/02/2017    9:38 AM  Advanced Directives  Does Patient Have a Medical Advance Directive? Yes No Yes Yes No;Yes Yes No  Type of AParamed[place]of [place]ark[place] will  Healthcare Power of [place] will Living will;Healthcare Power of [place] will   Does patient want to make changes to medical advance directive?     No - Patient declined No - Patient declined   Copy of [place] Chart? No - copy requested   No - copy requested No - copy requested No - copy requested   Would patient like information on creating a medical advance directive?  No - Patient declined         Current Medications (verified) Outpatient Encounter Medications as of 07/29/2022  Medication Sig   atorvastatin (LIPITOR) 10 MG tablet TAKE 1 TABLET BY MOUTH  DAILY   hydrochlorothiazide (HYDRODIURIL) 12.5 MG tablet TAKE 1 TABLET BY MOUTH  DAILY   Omega-3 Fatty Acids (FISH OIL) 1000 MG CAPS Take by mouth.   triamcinolone ointment (KENALOG) 0.5 % Apply 1 Application topically 2 (two) times daily. Up to 7 days as needed for skin irritation such as poison ivy   valsartan (DIOVAN) 80 MG tablet TAKE 2 TABLETS BY MOUTH  DAILY   RSV vaccine recomb adjuvanted (AREXVY) 120 MCG/0.5ML injection Inject into the muscle.   No  facility-administered encounter medications on file as of 07/29/2022.    Allergies (verified) Patient has no known allergies.   History: Past Medical History:  Diagnosis Date   Anemia    Hypertension    INGROWN NAIL 11/10/2010   Annotation: bilateral  Qualifier: Diagnosis of  By: JArnoldo MoraleMD, JBalinda Quails   Sickle cell trait ([facility]    Past Surgical History:  Procedure Laterality Date   CIRCUMCISION     age 76  COLONOSCOPY     Family History  Problem Relation Age of Onset   Hypertension Father    Heart disease Father    Hearing loss Mother    Heart disease Daughter    Obesity Daughter    Obesity Son    Diabetes Maternal Grandmother    Colon cancer Neg Hx    Esophageal cancer Neg Hx    Stomach cancer Neg Hx    Rectal cancer Neg Hx    Social History   Socioeconomic History   Marital status: Married    Spouse name: Ruby   Number of children: Not on file   Years of education: Not on file   Highest education level: Some college, no degree  Occupational History    Employer: RETIRED  Tobacco Use   Smoking status: Former    Packs/day: 0.50    Years: 3.00  Total pack years: 1.50    Types: Cigarettes    Quit date: 10/11/1968    Years since quitting: 53.8   Smokeless tobacco: Never  Vaping Use   Vaping Use: Never used  Substance and Sexual Activity   Alcohol use: Yes    Alcohol/week: 5.0 standard drinks of alcohol    Types: 1 Shots of liquor, 4 Standard drinks or equivalent per week    Comment: once a week    Drug use: No   Sexual activity: Not on file  Other Topics Concern   Not on file  Social History Narrative   Married (3rd marriage). 5 children (3 from 1st, 2 from second), 7 grandkids.       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 He lives with his wife in a 2 story house. He drinks one cup of coffee QOD, and occasionally drinks tea.   Social Determinants of Health    Financial Resource Strain: Low Risk  (07/29/2022)   Overall Financial Resource Strain (CARDIA)    Difficulty of Paying Living Expenses: Not hard at all  Food Insecurity: No Food Insecurity (07/29/2022)   Hunger Vital Sign    Worried About Running Out of Food in the Last Year: Never true    Ran Out of Food in the Last Year: Never true  Transportation Needs: No Transportation Needs (07/29/2022)   PRAPARE - Hydrologist (Medical): No    Lack of Transportation (Non-Medical): No  Physical Activity: Sufficiently Active (07/29/2022)   Exercise Vital Sign    Days of Exercise per Week: 6 days    Minutes of Exercise per Session: 90 min  Stress: No Stress Concern Present (07/29/2022)   [place]    Feeling of Stress : Not at all  Social Connections: Moderately Integrated (07/29/2022)   Social Connection and Isolation Panel [NHANES]    Frequency of Communication with Friends and Family: More than three times a week    Frequency of Social Gatherings with Friends and Family: More than three times a week    Attends Religious Services: Never    Marine scientist or Organizations: Yes    Attends Music therapist: 1 to 4 times per year    Marital Status: Married    Tobacco Counseling Counseling given: Not Answered   Clinical Intake:  Pre-visit preparation completed: Yes  Pain : No/denies pain     BMI - recorded: 30.19 Nutritional Status: BMI > 30  Obese Nutritional Risks: None Diabetes: No  How often do you need to have someone help you when you read instructions, pamphlets, or other written materials from your doctor or pharmacy?: 1 - Never  Diabetic?no  Interpreter Needed?: No  Information entered by :: Charlott Rakes, LPN   Activities of Daily Living    07/29/2022   11:54 AM  In your present state of health, do you have any difficulty performing the following  activities:  Hearing? 0  Vision? 0  Difficulty concentrating or making decisions? 0  Walking or climbing stairs? 0  Dressing or bathing? 0  Doing errands, shopping? 0  Preparing Food and eating ? N  Using the Toilet? N  In the past six months, have you accidently leaked urine? N  Do you have problems with loss of bowel control? N  Managing your Medications?  N  Managing your Finances? N  Housekeeping or managing your Housekeeping? N    Patient Care Team: Marin Olp, MD as PCP - General (Family Medicine) Buford Dresser, MD as PCP - Cardiology (Cardiology) Buford Dresser, MD as Consulting Physician (Cardiology) Edythe Clarity, [facility] (Pharmacist)  Indicate any recent Medical Services you may have received from other than [facility] providers in the past year (date may be approximate).     Assessment:   This is a routine wellness examination for [place].  Hearing/Vision screen Hearing Screening - Comments:: Pt stated slight loss  Vision Screening - Comments:: Pt follows up with [facility] for [facility] exams   Dietary issues and exercise activities discussed: Current Exercise Habits: Home exercise routine, Type of exercise: walking, Time (Minutes): > 60, Frequency (Times/Week): 6, Weekly Exercise (Minutes/Week): 0   Goals Addressed             This Visit's Progress    Patient Stated       Lose weight        Depression Screen    07/29/2022   11:51 AM 07/16/2021   11:59 AM 11/13/2020    9:48 AM 07/10/2020   11:54 AM 08/29/2019    9:55 AM 06/14/2019    3:27 PM 02/28/2019    9:54 AM  PHQ 2/9 Scores  PHQ - 2 Score 0 0 0 0 0 0 0  PHQ- 9 Score     0  0    Fall Risk    07/29/2022   11:54 AM 07/16/2021   12:01 PM 11/13/2020    9:48 AM 07/10/2020   11:57 AM 05/06/2020    2:57 PM  Fall Risk   Falls in the past year? 0 0 0 0 0  Number falls in past yr: 0 0 0 0 0  Injury with Fall? 0 0 0 0 0  Risk for fall due to : No Fall Risks;Impaired vision Impaired  vision  Impaired vision   Follow up Falls prevention discussed Falls prevention discussed  Falls prevention discussed     FALL RISK PREVENTION PERTAINING TO THE HOME:  Any stairs in or around the home? Yes  If so, are there any without handrails? No  Home free of loose throw rugs in walkways, pet beds, electrical cords, etc? Yes  Adequate lighting in your home to reduce risk of falls? Yes   ASSISTIVE DEVICES UTILIZED TO PREVENT FALLS:  Life alert? No  Use of a cane, walker or w/c? No  Grab bars in the bathroom? Yes  Shower chair or bench in shower? No  Elevated toilet seat or a handicapped toilet? No   TIMED UP AND GO:  Was the test performed? Yes .  Length of time to ambulate 10 feet: 10 sec.   Gait steady and fast without use of assistive device  Cognitive Function:        07/29/2022   11:54 AM 07/16/2021   12:02 PM 07/10/2020   12:01 PM 06/14/2019    3:29 PM 05/23/2018    3:26 PM  6CIT Screen  What Year? 0 points 0 points 0 points 0 points 0 points  What month? 0 points 0 points 0 points 0 points 0 points  What time? 0 points 0 points  0 points 0 points  Count back from 20 0 points 0 points 0 points 0 points 0 points  Months in reverse 0 points 0 points 0 points 0 points 0 points  Repeat phrase 0 points 0  points 0 points 2 points   Total Score 0 points 0 points  2 points     Immunizations Immunization History  Administered Date(s) Administered   Fluad Quad(high Dose 65+) 06/14/2019, 09/15/2020   Influenza Split 08/15/2012   Influenza, High Dose Seasonal PF 07/14/2013, 07/05/2014   Influenza-Unspecified 07/20/2015, 07/26/2018   PFIZER Comirnaty(Gray Top)Covid-19 Tri-Sucrose Vaccine 02/09/2021   PFIZER(Purple Top)SARS-COV-2 Vaccination 12/04/2019, 12/25/2019, 07/15/2020   Pfizer Covid-19 Vaccine Bivalent Booster 67yr & up 08/12/2021, 02/23/2022   Pneumococcal Conjugate-13 07/05/2014   Pneumococcal Polysaccharide-23 02/12/2013   Respiratory Syncytial Virus  Vaccine,Recomb Aduvanted(Arexvy) 07/09/2022   Td 10/11/2005   Tdap 03/17/2018   Zoster Recombinat (Shingrix) 12/09/2021, 02/17/2022   Zoster, Live 02/04/2015    TDAP status: Up to date  Flu Vaccine status: Due, Education has been provided regarding the importance of this vaccine. Advised may receive this vaccine at local pharmacy or Health Dept. Aware to provide a copy of the vaccination record if obtained from local pharmacy or Health Dept. Verbalized acceptance and understanding.  Pneumococcal vaccine status: Up to date  Covid-19 vaccine status: Completed vaccines  Qualifies for Shingles Vaccine? Yes   Zostavax completed Yes   Shingrix Completed?: Yes  Screening Tests Health Maintenance  Topic Date Due   COVID-19 Vaccine (7 - Pfizer risk series) 04/20/2022   INFLUENZA VACCINE  05/11/2022   Hepatitis C Screening  10/08/2098 (Originally 06/19/1964)   COLONOSCOPY (Pts 45-429yrInsurance coverage will need to be confirmed)  10/29/2025   TETANUS/TDAP  03/17/2028   Pneumonia Vaccine 6583Years old  Completed   Zoster Vaccines- Shingrix  Completed   HPV VACCINES  Aged Out    Health Maintenance  Health Maintenance Due  Topic Date Due   COVID-19 Vaccine (7 - Pfizer risk series) 04/20/2022   INFLUENZA VACCINE  05/11/2022    Colorectal cancer screening: Type of screening: Colonoscopy. Completed 10/29/20. Repeat every 5 years  Additional Screening:  Hepatitis C Screening: does qualify; Vision Screening: Recommended annual ophthalmology exams for early detection of glaucoma and other disorders of the eye. Is the patient up to date with their annual eye exam?  Yes  Who is the provider or what is the name of the office in which the patient attends annual eye exams? [facility]  If pt is not established with a provider, would they like to be referred to a provider to establish care? No .   Dental Screening: Recommended annual dental exams for proper oral hygiene  Community  Resource Referral / Chronic Care Management: CRR required this visit?  No   CCM required this visit?  No      Plan:     I have personally reviewed and noted the following in the patient's chart:   Medical and social history Use of alcohol, tobacco or illicit drugs  Current medications and supplements including opioid prescriptions. Patient is not currently taking opioid prescriptions. Functional ability and status Nutritional status Physical activity Advanced directives List of other physicians Hospitalizations, surgeries, and ER visits in previous 12 months Vitals Screenings to include cognitive, depression, and falls Referrals and appointments  In addition, I have reviewed and discussed with patient certain preventive protocols, quality metrics, and best practice recommendations. A written personalized care plan for preventive services as well as general preventive health recommendations were provided to patient.     TiWillette BraceLPN   [id number] Nurse Notes: none

## 2022-09-16 ENCOUNTER — Other Ambulatory Visit: Payer: Self-pay | Admitting: Family Medicine

## 2022-09-23 ENCOUNTER — Encounter: Payer: Self-pay | Admitting: *Deleted

## 2022-09-24 ENCOUNTER — Other Ambulatory Visit (HOSPITAL_BASED_OUTPATIENT_CLINIC_OR_DEPARTMENT_OTHER): Payer: Self-pay

## 2022-09-24 MED ORDER — INFLUENZA VAC A&B SA ADJ QUAD 0.5 ML IM PRSY
PREFILLED_SYRINGE | INTRAMUSCULAR | 0 refills | Status: DC
  Filled 2022-09-24: qty 0.5, 1d supply, fill #0

## 2022-09-24 MED ORDER — COMIRNATY 30 MCG/0.3ML IM SUSY
PREFILLED_SYRINGE | INTRAMUSCULAR | 0 refills | Status: DC
  Filled 2022-09-24: qty 0.3, 1d supply, fill #0

## 2022-10-28 ENCOUNTER — Other Ambulatory Visit (HOSPITAL_BASED_OUTPATIENT_CLINIC_OR_DEPARTMENT_OTHER): Payer: Self-pay

## 2022-11-02 ENCOUNTER — Ambulatory Visit (INDEPENDENT_AMBULATORY_CARE_PROVIDER_SITE_OTHER): Payer: Medicare Other | Admitting: Family Medicine

## 2022-11-02 ENCOUNTER — Encounter: Payer: Self-pay | Admitting: Family Medicine

## 2022-11-02 VITALS — BP 108/62 | HR 94 | Temp 97.7°F | Ht 73.0 in | Wt 215.2 lb

## 2022-11-02 DIAGNOSIS — E785 Hyperlipidemia, unspecified: Secondary | ICD-10-CM

## 2022-11-02 DIAGNOSIS — D472 Monoclonal gammopathy: Secondary | ICD-10-CM

## 2022-11-02 DIAGNOSIS — I714 Abdominal aortic aneurysm, without rupture, unspecified: Secondary | ICD-10-CM | POA: Diagnosis not present

## 2022-11-02 DIAGNOSIS — C61 Malignant neoplasm of prostate: Secondary | ICD-10-CM | POA: Diagnosis not present

## 2022-11-02 DIAGNOSIS — D696 Thrombocytopenia, unspecified: Secondary | ICD-10-CM

## 2022-11-02 DIAGNOSIS — D573 Sickle-cell trait: Secondary | ICD-10-CM

## 2022-11-02 DIAGNOSIS — I10 Essential (primary) hypertension: Secondary | ICD-10-CM

## 2022-11-02 DIAGNOSIS — R739 Hyperglycemia, unspecified: Secondary | ICD-10-CM

## 2022-11-02 DIAGNOSIS — N183 Chronic kidney disease, stage 3 unspecified: Secondary | ICD-10-CM

## 2022-11-02 NOTE — Patient Instructions (Addendum)
fatigue in patient with history of anemia of unknown cause requiring transfusion in the past- this could be related to low blood pressure today with weight loss but we will update labs as well -if worsening symptoms or chest pain or shortness of breath develop, seek care immediately  Blood pressure over controlled- hold hydrochlorothiazide for next 2 days and update me with blood pressures- I may further reduce your medicine but also do not want blood pressure to swing up too much either -if home blood pressure >150 can take the hydrochlorothiazide still   Recommended follow up: Return in about 1 month (around 12/03/2022) for followup or sooner if needed.Schedule b4 you leave.

## 2022-11-02 NOTE — Progress Notes (Signed)
Phone [phone] In person visit   Subjective:   Caleb Horton is a 77 y.o. year old very pleasant male patient who presents for/with See problem oriented charting Chief Complaint  Patient presents with   low energy    Pt has no questions or concerns.. 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    Priority: Medium    Essential hypertension 11/27/2007    Priority: Medium    History of adenomatous polyp of colon 08/05/2015    Priority: Low   Elevated blood protein 02/04/2015    Priority: Low   Hyperglycemia 11/27/2007    Priority: Low   Sickle-cell trait ([place]) 11/27/2007    Priority: Low   BPH (benign prostatic hyperplasia) 11/27/2007    Priority: Low   Stage 3 chronic kidney disease, unspecified whether stage 3a or 3b CKD ([place]) 11/02/2022   Thrombocytopenia ([place]) 09/19/2015    Medications- reviewed and updated Current Outpatient Medications  Medication Sig Dispense Refill   atorvastatin (LIPITOR) 10 MG tablet TAKE 1 TABLET BY MOUTH  DAILY 90 tablet 3   COVID-19 mRNA vaccine 2023-2024 (COMIRNATY) syringe Inject into the muscle. 0.3 mL 0   hydrochlorothiazide (HYDRODIURIL) 12.5 MG tablet TAKE 1 TABLET BY MOUTH  DAILY 90 tablet 3   ibuprofen (ADVIL) 800 MG tablet Take 800 mg by mouth 3 (three) times daily.     influenza vaccine adjuvanted (FLUAD) 0.5 ML injection Inject into the muscle. 0.5 mL 0   Omega-3 Fatty Acids (FISH OIL) 1000 MG CAPS Take by mouth.     RSV vaccine recomb adjuvanted (AREXVY) 120  MCG/0.5ML injection Inject into the muscle. 0.5 mL 0   triamcinolone ointment (KENALOG) 0.5 % Apply 1 Application topically 2 (two) times daily. Up to 7 days as needed for skin irritation such as poison ivy 60 g 1   valsartan (DIOVAN) 80 MG tablet TAKE 2 TABLETS BY MOUTH DAILY 200 tablet 2   No current facility-administered medications for this visit.     Objective:  BP 108/62   Pulse 94   Temp 97.7 F (36.5 C) (Temporal)   Ht '6\' 1"'$  (1.854 m)   Wt 215 lb 3.2 oz (97.6 kg)   SpO2 97%   BMI 28.39 kg/m  Gen: NAD, resting comfortably, appears more fatigued than usual CV: RRR no murmurs rubs or gallops Lungs: CTAB no crackles, wheeze, rhonchi Abdomen: soft/nontender/nondistended/normal bowel sounds. No rebound or guarding.  Ext: no edema Skin: warm, dry     Assessment and Plan   #prostate cancer- under active surveillance -last seen June 02, 2022 with plan for 10-monthfollow-up-PSA apparently downtrending at that point which was reassuring -Continue close follow-up with urology for active surveillance  #fatigue in patient with gammopathy/thrombocytopenia  S: released from hematology in 2017. We continue to monitor CBC with diff. Also with thrombocytopenia-mild but stable-continue to monitor -he does have some low energy/fatigue starting Thursday- could have had GI illness- low  appetite through Friday and had loose bowel movement (possible GI illness)-we will update CBC along with CMP and TSH -reports felt similar in the past when he required a transfusion of 2 units years ago- source was never found -currently -No chest pain or shortness of breath. No brbpr or melena  -has had lower appetite for a few days -has lost 13 lbs but exercising regularly and trying to reduce portions -doubt prostate related as see above- psa downtrending Lab Results  Component Value Date   WBC 6.5 07/01/2022   HGB 12.4 (L) 07/01/2022   HCT 37.3 (L) 07/01/2022   MCV 83.8 07/01/2022   PLT 137.0 (L)  07/01/2022  A/P: fatigue in patient with history of anemia of unknown cause requiring transfusion in the past- this could be related to low blood pressure today with weight loss but we will update labs as well- also see hypertension section  -did consider EKG and may do this if labs ok or if no underlying etiology noted  #hyperlipidemia S: Medication: Atorvastatin '10mg'$   Lab Results  Component Value Date   CHOL 131 07/01/2022   HDL 46.60 07/01/2022   LDLCALC 70 07/01/2022   LDLDIRECT 81.0 11/13/2020   TRIG 74.0 07/01/2022   CHOLHDL 3 07/01/2022   A/P: Reasonable control last check-continue current medication  #hypertension # CKD stage III S: medication: HCTZ 12.'5Mg'$ , valsartan '160Mg'$  Home BP 136/87 the other night- was already feeling fatigued BP Readings from Last 3 Encounters:  11/02/22 108/62  07/29/22 126/68  07/01/22 108/72    A/P: Hypertension-Blood pressure over controlled- hold hydrochlorothiazide for next 2 days and update me with blood pressures- I may further reduce your medicine but also do not want blood pressure to swing up too much either  -if home blood pressure >150 can take the hydrochlorothiazide still  CKD stage III-hopefully stable- update cmp today. Continue current meds for now   # Hyperglycemia/insulin resistance/prediabetes- peak a1c 6.6 x1 with at home test, otherwise lower than that S:  Medication:  none Exercise and diet-  5 days a week at [facility], eating healthier Lab Results  Component Value Date   HGBA1C 5.9 07/01/2022   HGBA1C 5.8 11/13/2020   HGBA1C 5.8 (H) 05/06/2020   A/P: hopefully improved- update a1c today  #AAA screening- noted on initial screen but not on repeat- have contacted radiologist in past and they stated no further follow up needed.   #sickle cell trait - noted-has not had issues related to this  Recommended follow up: Return in about 1 month (around 12/03/2022) for followup or sooner if needed.Schedule b4 you leave. Future  Appointments  Date Time Provider [place]  07/06/2023 11:00 AM Marin Olp, MD [facility]  08/04/2023 11:30 AM [facility]    Lab/Order associations:   ICD-10-CM   1. Gammopathy  D47.2     2. Prostate cancer ([place])  C61     3. Abdominal aortic aneurysm (AAA) without rupture, unspecified part ([place])  I71.40     4. Essential hypertension  I10 TSH    CBC with Differential/Platelet    Comprehensive metabolic panel    5. Hyperlipidemia, unspecified hyperlipidemia type  E78.5 TSH    CBC with Differential/Platelet    Comprehensive metabolic panel    6. Hyperglycemia  R73.9 HgB A1c    7. Sickle-cell trait ([place])  D57.3     8. Thrombocytopenia ([place])  D69.6     9. Stage 3 chronic kidney disease, unspecified whether stage 3a  or 3b CKD ([place])  N18.30       No orders of the defined types were placed in this encounter.   Return precautions advised.  Garret Reddish, MD

## 2022-11-03 LAB — COMPREHENSIVE METABOLIC PANEL
ALT: 22 U/L (ref 0–53)
AST: 20 U/L (ref 0–37)
Albumin: 3.1 g/dL — ABNORMAL LOW (ref 3.5–5.2)
Alkaline Phosphatase: 64 U/L (ref 39–117)
BUN: 43 mg/dL — ABNORMAL HIGH (ref 6–23)
CO2: 24 mEq/L (ref 19–32)
Calcium: 9.7 mg/dL (ref 8.4–10.5)
Chloride: 103 mEq/L (ref 96–112)
Creatinine, Ser: 1.73 mg/dL — ABNORMAL HIGH (ref 0.40–1.50)
GFR: 38.17 mL/min — ABNORMAL LOW (ref >60.00)
Glucose, Bld: 120 mg/dL — ABNORMAL HIGH (ref 70–99)
Potassium: 3.9 mEq/L (ref 3.5–5.1)
Sodium: 134 mEq/L — ABNORMAL LOW (ref 135–145)
Total Bilirubin: 0.4 mg/dL (ref 0.2–1.2)
Total Protein: 8.2 g/dL (ref 6.0–8.3)

## 2022-11-03 LAB — CBC WITH DIFFERENTIAL/PLATELET
Basophils Absolute: 0 10*3/uL (ref 0.0–0.1)
Basophils Relative: 0.3 % (ref 0.0–3.0)
Eosinophils Absolute: 0.1 10*3/uL (ref 0.0–0.7)
Eosinophils Relative: 1 % (ref 0.0–5.0)
HCT: 28 % — ABNORMAL LOW (ref 39.0–52.0)
Hemoglobin: 9.4 g/dL — ABNORMAL LOW (ref 13.0–17.0)
Lymphocytes Relative: 13.2 % (ref 12.0–46.0)
Lymphs Abs: 1.4 10*3/uL (ref 0.7–4.0)
MCHC: 33.6 g/dL (ref 30.0–36.0)
MCV: 82.1 fl (ref 78.0–100.0)
Monocytes Absolute: 1.2 10*3/uL — ABNORMAL HIGH (ref 0.1–1.0)
Monocytes Relative: 11.3 % (ref 3.0–12.0)
Neutro Abs: 7.8 10*3/uL — ABNORMAL HIGH (ref 1.4–7.7)
Neutrophils Relative %: 74.2 % (ref 43.0–77.0)
Platelets: 324 10*3/uL (ref 150.0–400.0)
RBC: 3.42 Mil/uL — ABNORMAL LOW (ref 4.22–5.81)
RDW: 15.5 % (ref 11.5–15.5)
WBC: 10.5 10*3/uL (ref 4.0–10.5)

## 2022-11-03 LAB — TSH: TSH: 1.1 u[IU]/mL (ref 0.35–5.50)

## 2022-11-03 LAB — HEMOGLOBIN A1C: Hgb A1c MFr Bld: 5.7 % (ref 4.6–6.5)

## 2022-11-04 ENCOUNTER — Other Ambulatory Visit (INDEPENDENT_AMBULATORY_CARE_PROVIDER_SITE_OTHER): Payer: Medicare Other

## 2022-11-04 ENCOUNTER — Other Ambulatory Visit: Payer: Self-pay

## 2022-11-04 DIAGNOSIS — D649 Anemia, unspecified: Secondary | ICD-10-CM

## 2022-11-04 DIAGNOSIS — R7989 Other specified abnormal findings of blood chemistry: Secondary | ICD-10-CM

## 2022-11-05 ENCOUNTER — Other Ambulatory Visit: Payer: Self-pay

## 2022-11-05 DIAGNOSIS — D472 Monoclonal gammopathy: Secondary | ICD-10-CM

## 2022-11-05 LAB — COMPREHENSIVE METABOLIC PANEL
ALT: 18 U/L (ref 0–53)
AST: 18 U/L (ref 0–37)
Albumin: 3.1 g/dL — ABNORMAL LOW (ref 3.5–5.2)
Alkaline Phosphatase: 65 U/L (ref 39–117)
BUN: 32 mg/dL — ABNORMAL HIGH (ref 6–23)
CO2: 26 mEq/L (ref 19–32)
Calcium: 9.8 mg/dL (ref 8.4–10.5)
Chloride: 103 mEq/L (ref 96–112)
Creatinine, Ser: 1.71 mg/dL — ABNORMAL HIGH (ref 0.40–1.50)
GFR: 38.71 mL/min — ABNORMAL LOW (ref >60.00)
Glucose, Bld: 117 mg/dL — ABNORMAL HIGH (ref 70–99)
Potassium: 3.9 mEq/L (ref 3.5–5.1)
Sodium: 135 mEq/L (ref 135–145)
Total Bilirubin: 0.3 mg/dL (ref 0.2–1.2)
Total Protein: 8.5 g/dL — ABNORMAL HIGH (ref 6.0–8.3)

## 2022-11-05 LAB — BASIC METABOLIC PANEL
BUN: 32 mg/dL — ABNORMAL HIGH (ref 6–23)
CO2: 26 mEq/L (ref 19–32)
Calcium: 9.8 mg/dL (ref 8.4–10.5)
Chloride: 103 mEq/L (ref 96–112)
Creatinine, Ser: 1.71 mg/dL — ABNORMAL HIGH (ref 0.40–1.50)
GFR: 38.71 mL/min — ABNORMAL LOW (ref >60.00)
Glucose, Bld: 117 mg/dL — ABNORMAL HIGH (ref 70–99)
Potassium: 3.9 mEq/L (ref 3.5–5.1)
Sodium: 135 mEq/L (ref 135–145)

## 2022-11-05 LAB — CBC
HCT: 27.5 % — ABNORMAL LOW (ref 39.0–52.0)
Hemoglobin: 9.2 g/dL — ABNORMAL LOW (ref 13.0–17.0)
MCHC: 33.4 g/dL (ref 30.0–36.0)
MCV: 82.1 fl (ref 78.0–100.0)
Platelets: 343 10*3/uL (ref 150.0–400.0)
RBC: 3.35 Mil/uL — ABNORMAL LOW (ref 4.22–5.81)
RDW: 15.4 % (ref 11.5–15.5)
WBC: 8.6 10*3/uL (ref 4.0–10.5)

## 2022-11-08 ENCOUNTER — Other Ambulatory Visit: Payer: Self-pay

## 2022-11-09 ENCOUNTER — Telehealth: Payer: Self-pay

## 2022-11-09 ENCOUNTER — Telehealth: Payer: Self-pay | Admitting: Nurse Practitioner

## 2022-11-09 ENCOUNTER — Other Ambulatory Visit (INDEPENDENT_AMBULATORY_CARE_PROVIDER_SITE_OTHER): Payer: Medicare Other

## 2022-11-09 DIAGNOSIS — D649 Anemia, unspecified: Secondary | ICD-10-CM | POA: Diagnosis not present

## 2022-11-09 LAB — FECAL OCCULT BLOOD, IMMUNOCHEMICAL: Fecal Occult Bld: POSITIVE — AB

## 2022-11-09 NOTE — Telephone Encounter (Signed)
Lori from [place] [facility] called and stated pt has Positive Ifob

## 2022-11-09 NOTE — Telephone Encounter (Signed)
Scheduled appt per 1/26 referral. Pt is aware of appt date and time. Pt is aware to arrive 15 mins prior to appt time and to bring and updated insurance card. Pt is aware of appt location.

## 2022-11-10 NOTE — Telephone Encounter (Signed)
Please place urgent referral to GI under anemia and blood in stool  Also recheck CBC- if worsening we may have to send him to hospital. If any chest pain, shortness of breath, worsening fatigue- needs to seek care asap

## 2022-11-10 NOTE — Addendum Note (Signed)
Addended by: Wyvonna Plum on: 11/10/2022 09:16 AM   Modules accepted: Orders

## 2022-11-10 NOTE — Telephone Encounter (Signed)
Called and spoke with pt, lab placed and pt will come tomorrow for that and urgent GI referral placed.

## 2022-11-11 ENCOUNTER — Other Ambulatory Visit: Payer: Self-pay

## 2022-11-11 ENCOUNTER — Other Ambulatory Visit (INDEPENDENT_AMBULATORY_CARE_PROVIDER_SITE_OTHER): Payer: Medicare Other

## 2022-11-11 DIAGNOSIS — D472 Monoclonal gammopathy: Secondary | ICD-10-CM

## 2022-11-11 DIAGNOSIS — D649 Anemia, unspecified: Secondary | ICD-10-CM | POA: Diagnosis not present

## 2022-11-11 LAB — CBC WITH DIFFERENTIAL/PLATELET
Basophils Absolute: 0.1 10*3/uL (ref 0.0–0.1)
Basophils Relative: 1 % (ref 0.0–3.0)
Eosinophils Absolute: 0.2 10*3/uL (ref 0.0–0.7)
Eosinophils Relative: 3.3 % (ref 0.0–5.0)
HCT: 26.3 % — ABNORMAL LOW (ref 39.0–52.0)
Hemoglobin: 9 g/dL — ABNORMAL LOW (ref 13.0–17.0)
Lymphocytes Relative: 21.1 % (ref 12.0–46.0)
Lymphs Abs: 1.4 10*3/uL (ref 0.7–4.0)
MCHC: 34.3 g/dL (ref 30.0–36.0)
MCV: 79.7 fl (ref 78.0–100.0)
Monocytes Absolute: 0.6 10*3/uL (ref 0.1–1.0)
Monocytes Relative: 8.1 % (ref 3.0–12.0)
Neutro Abs: 4.5 10*3/uL (ref 1.4–7.7)
Neutrophils Relative %: 66.5 % (ref 43.0–77.0)
Platelets: 291 10*3/uL (ref 150.0–400.0)
RBC: 3.3 Mil/uL — ABNORMAL LOW (ref 4.22–5.81)
RDW: 15.7 % — ABNORMAL HIGH (ref 11.5–15.5)
WBC: 6.8 10*3/uL (ref 4.0–10.5)

## 2022-11-16 ENCOUNTER — Other Ambulatory Visit: Payer: Self-pay

## 2022-11-16 DIAGNOSIS — D472 Monoclonal gammopathy: Secondary | ICD-10-CM

## 2022-11-17 ENCOUNTER — Inpatient Hospital Stay: Payer: Medicare Other | Attending: Nurse Practitioner | Admitting: Nurse Practitioner

## 2022-11-17 ENCOUNTER — Telehealth: Payer: Self-pay

## 2022-11-17 ENCOUNTER — Inpatient Hospital Stay: Payer: Medicare Other | Admitting: Nurse Practitioner

## 2022-11-17 VITALS — BP 130/71 | HR 115 | Temp 98.7°F | Resp 18 | Ht 73.0 in | Wt 211.5 lb

## 2022-11-17 DIAGNOSIS — R630 Anorexia: Secondary | ICD-10-CM | POA: Insufficient documentation

## 2022-11-17 DIAGNOSIS — D649 Anemia, unspecified: Secondary | ICD-10-CM | POA: Diagnosis not present

## 2022-11-17 DIAGNOSIS — D472 Monoclonal gammopathy: Secondary | ICD-10-CM | POA: Insufficient documentation

## 2022-11-17 DIAGNOSIS — N4231 Prostatic intraepithelial neoplasia: Secondary | ICD-10-CM | POA: Insufficient documentation

## 2022-11-17 DIAGNOSIS — R5383 Other fatigue: Secondary | ICD-10-CM | POA: Insufficient documentation

## 2022-11-17 DIAGNOSIS — I129 Hypertensive chronic kidney disease with stage 1 through stage 4 chronic kidney disease, or unspecified chronic kidney disease: Secondary | ICD-10-CM | POA: Insufficient documentation

## 2022-11-17 DIAGNOSIS — Z87891 Personal history of nicotine dependence: Secondary | ICD-10-CM | POA: Diagnosis not present

## 2022-11-17 DIAGNOSIS — D638 Anemia in other chronic diseases classified elsewhere: Secondary | ICD-10-CM | POA: Diagnosis not present

## 2022-11-17 DIAGNOSIS — D573 Sickle-cell trait: Secondary | ICD-10-CM | POA: Insufficient documentation

## 2022-11-17 DIAGNOSIS — N189 Chronic kidney disease, unspecified: Secondary | ICD-10-CM | POA: Diagnosis not present

## 2022-11-17 DIAGNOSIS — Z79899 Other long term (current) drug therapy: Secondary | ICD-10-CM | POA: Insufficient documentation

## 2022-11-17 LAB — CMP (CANCER CENTER ONLY)
ALT: 11 U/L (ref 0–44)
AST: 15 U/L (ref 15–41)
Albumin: 3.4 g/dL — ABNORMAL LOW (ref 3.5–5.0)
Alkaline Phosphatase: 77 U/L (ref 38–126)
Anion gap: 4 — ABNORMAL LOW (ref 5–15)
BUN: 17 mg/dL (ref 8–23)
CO2: 24 mmol/L (ref 22–32)
Calcium: 10.8 mg/dL — ABNORMAL HIGH (ref 8.9–10.3)
Chloride: 106 mmol/L (ref 98–111)
Creatinine: 1.4 mg/dL — ABNORMAL HIGH (ref 0.61–1.24)
GFR, Estimated: 52 mL/min — ABNORMAL LOW (ref >60)
Glucose, Bld: 104 mg/dL — ABNORMAL HIGH (ref 70–99)
Potassium: 4 mmol/L (ref 3.5–5.1)
Sodium: 134 mmol/L — ABNORMAL LOW (ref 135–145)
Total Bilirubin: 0.3 mg/dL (ref 0.3–1.2)
Total Protein: 8.6 g/dL — ABNORMAL HIGH (ref 6.5–8.1)

## 2022-11-17 LAB — CBC WITH DIFFERENTIAL (CANCER CENTER ONLY)
Abs Immature Granulocytes: 0.03 10*3/uL (ref 0.00–0.07)
Basophils Absolute: 0.1 10*3/uL (ref 0.0–0.1)
Basophils Relative: 1 %
Eosinophils Absolute: 0.1 10*3/uL (ref 0.0–0.5)
Eosinophils Relative: 2 %
HCT: 27.7 % — ABNORMAL LOW (ref 39.0–52.0)
Hemoglobin: 9.2 g/dL — ABNORMAL LOW (ref 13.0–17.0)
Immature Granulocytes: 0 %
Lymphocytes Relative: 25 %
Lymphs Abs: 1.9 10*3/uL (ref 0.7–4.0)
MCH: 26.6 pg (ref 26.0–34.0)
MCHC: 33.2 g/dL (ref 30.0–36.0)
MCV: 80.1 fL (ref 80.0–100.0)
Monocytes Absolute: 0.5 10*3/uL (ref 0.1–1.0)
Monocytes Relative: 7 %
Neutro Abs: 4.9 10*3/uL (ref 1.7–7.7)
Neutrophils Relative %: 65 %
Platelet Count: 225 10*3/uL (ref 150–400)
RBC: 3.46 MIL/uL — ABNORMAL LOW (ref 4.22–5.81)
RDW: 15.1 % (ref 11.5–15.5)
WBC Count: 7.6 10*3/uL (ref 4.0–10.5)
nRBC: 0 % (ref 0.0–0.2)

## 2022-11-17 LAB — SAMPLE TO BLOOD BANK

## 2022-11-17 LAB — IRON AND IRON BINDING CAPACITY (CC-WL,HP ONLY)
Iron: 26 ug/dL — ABNORMAL LOW (ref 45–182)
Saturation Ratios: 10 % — ABNORMAL LOW (ref 17.9–39.5)
TIBC: 258 ug/dL (ref 250–450)
UIBC: 232 ug/dL (ref 117–376)

## 2022-11-17 LAB — RETIC PANEL
Immature Retic Fract: 20.2 % — ABNORMAL HIGH (ref 2.3–15.9)
RBC.: 3.35 MIL/uL — ABNORMAL LOW (ref 4.22–5.81)
Retic Count, Absolute: 54.3 10*3/uL (ref 19.0–186.0)
Retic Ct Pct: 1.6 % (ref 0.4–3.1)
Reticulocyte Hemoglobin: 28.9 pg (ref >27.9)

## 2022-11-17 LAB — FERRITIN: Ferritin: 39 ng/mL (ref 24–336)

## 2022-11-17 LAB — VITAMIN B12: Vitamin B-12: 900 pg/mL (ref 180–914)

## 2022-11-17 LAB — LACTATE DEHYDROGENASE: LDH: 86 U/L — ABNORMAL LOW (ref 98–192)

## 2022-11-17 NOTE — Progress Notes (Unsigned)
[place]   Telephone:(336) [phone] Fax:(336) [place] consult Note   Patient Care Team: Marin Olp, MD as PCP - General (Family Medicine) Buford Dresser, MD as PCP - Cardiology (Cardiology) Buford Dresser, MD as Consulting Physician (Cardiology) Edythe Clarity, [facility] (Pharmacist) Truitt Merle, MD as Attending Physician (Hematology and Oncology) Alla Feeling, NP as Nurse Practitioner (Hematology and Oncology) Date of Service: 11/17/2022  CHIEF COMPLAINTS/PURPOSE OF CONSULTATION:  H/o gammopathy and worsening anemia, referred by PCP Dr. Garret Reddish  HISTORY OF PRESENTING ILLNESS:  Caleb Horton 77 y.o. male with past medical history of HTN, HL, AAA, CKD, neuropathy, sickle cell trait, thrombocytopenia, elevated PSA, and abnormal SPEP is here because of history of gammopathy, initially seen by Dr. Burr Medico in 2016 and 2017.  Due to thrombocytopenia and elevated serum protein he underwent further work up 02/04/15 showing serum protein 8.9, gammaglobulin 3.2, elevated IgG 3650 and elevated kappa and lambda free light chains.  M spike was not detected.  Bone survey was negative. This was not felt to be MGUS or Multiple Myeloma, he was followed briefly then discharged from heme f/up in 2017. 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 Plts have been intermittent and mild in the interim. 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 He has been under active surveillance. He developed anemia with hemoglobin 12.5 on 05/06/2020 which has down trended currently 9.0 as of 11/11/22, platelets normal lately.  Further testing showed positive FOBT; GI consult later this month. He had a blood transfusion 20 years ago after an acute blood loss episode but he is not sure what caused it. He was on oral iron for some  time but stopped 1 year ago. He takes periodic aspirin.   Socially, he is married has 5 children who are healthy/no anemia.  Last colonoscopy by Dr. Hilarie Fredrickson 10/29/2020 showed a 4 mm polyp in the ascending colon and hemorrhoids.  Retired from [employer], independent with ADLs and very active with exercise 5-6 days/week until 2 months ago.  He drinks 4 shots of alcohol per week, no drug use, and remote tobacco use half pack per day for 3-4 years but quit a long time ago.  Denies family history of cancer or blood disorder.  He presents with his spouse.  He had a near syncopal episode 2 months ago and noticed he cannot finish one of his exercise classes.  Since then he has felt weak and fatigued, low activity level at home resting mostly.  He also has low appetite which has improved some, and 20 pounds intentional weight loss in 4 months.  Denies obvious bleeding, change in bowel habits, abdominal pain, fever, chills, cough, chest pain, dyspnea.  Denies urinary symptoms except cloudy urine.   MEDICAL HISTORY:  Past Medical History:  Diagnosis Date   Anemia    Hypertension    INGROWN NAIL 11/10/2010   Annotation: bilateral  Qualifier: Diagnosis of  By: Arnoldo Morale MD, Balinda Quails    Sickle cell trait [facility])     SURGICAL HISTORY: Past Surgical History:  Procedure Laterality Date   CIRCUMCISION     age 6   COLONOSCOPY      SOCIAL HISTORY: Social History   Socioeconomic History   Marital status: Married    Spouse name: [place]   Number of children: Not  on file   Years of education: Not on file   Highest education level: Some college, no degree  Occupational History    Employer: RETIRED  Tobacco Use   Smoking status: Former    Packs/day: 0.50    Years: 3.00    Total pack years: 1.50    Types: Cigarettes    Quit date: 10/11/1968    Years since quitting: 54.1   Smokeless tobacco: Never  Vaping Use   Vaping Use: Never used  Substance and Sexual Activity   Alcohol use: Yes    Alcohol/week: 5.0 standard  drinks of alcohol    Types: 1 Shots of liquor, 4 Standard drinks or equivalent per week    Comment: once a week    Drug use: No   Sexual activity: Not on file  Other Topics Concern   Not on file  Social History Narrative   Married (3rd marriage). 5 children (3 from 1st, 2 from second), 7 grandkids.       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 He lives with his wife in a 2 story house. He drinks one cup of coffee QOD, and occasionally drinks tea.   Social Determinants of Health   Financial Resource Strain: Low Risk  (07/29/2022)   Overall Financial Resource Strain (CARDIA)    Difficulty of Paying Living Expenses: Not hard at all  Food Insecurity: No Food Insecurity (07/29/2022)   Hunger Vital Sign    Worried About Running Out of Food in the Last Year: Never true    Ran Out of Food in the Last Year: Never true  Transportation Needs: No Transportation Needs (07/29/2022)   PRAPARE - Hydrologist (Medical): No    Lack of Transportation (Non-Medical): No  Physical Activity: Sufficiently Active (07/29/2022)   Exercise Vital Sign    Days of Exercise per Week: 6 days    Minutes of Exercise per Session: 90 min  Stress: No Stress Concern Present (07/29/2022)   [place]    Feeling of Stress : Not at all  Social Connections: Moderately Integrated (07/29/2022)   Social Connection and Isolation Panel [NHANES]    Frequency of Communication with Friends and Family: More than three times a week    Frequency of Social Gatherings with Friends and Family: More than three times a week    Attends Religious Services: Never    Marine scientist or Organizations: Yes    Attends Archivist Meetings: 1 to 4 times per year    Marital Status: Married  Human resources officer Violence: Not At Risk (07/29/2022)    Humiliation, Afraid, Rape, and Kick questionnaire    Fear of Current or Ex-Partner: No    Emotionally Abused: No    Physically Abused: No    Sexually Abused: No    FAMILY HISTORY: Family History  Problem Relation Age of Onset   Hypertension Father    Heart disease Father    Hearing loss Mother    Heart disease Daughter    Obesity Daughter    Obesity Son    Diabetes Maternal Grandmother    Colon cancer Neg Hx    Esophageal cancer Neg Hx    Stomach cancer Neg Hx    Rectal cancer Neg Hx     ALLERGIES:  has No Known Allergies.  MEDICATIONS:  Current Outpatient Medications  Medication Sig Dispense Refill   atorvastatin (LIPITOR) 10 MG tablet TAKE 1 TABLET BY MOUTH  DAILY 90 tablet 3   COVID-19 mRNA vaccine 2023-2024 (COMIRNATY) syringe Inject into the muscle. 0.3 mL 0   ibuprofen (ADVIL) 800 MG tablet Take 800 mg by mouth 3 (three) times daily.     influenza vaccine adjuvanted (FLUAD) 0.5 ML injection Inject into the muscle. 0.5 mL 0   Omega-3 Fatty Acids (FISH OIL) 1000 MG CAPS Take by mouth.     RSV vaccine recomb adjuvanted (AREXVY) 120 MCG/0.5ML injection Inject into the muscle. 0.5 mL 0   triamcinolone ointment (KENALOG) 0.5 % Apply 1 Application topically 2 (two) times daily. Up to 7 days as needed for skin irritation such as poison ivy 60 g 1   valsartan (DIOVAN) 80 MG tablet TAKE 2 TABLETS BY MOUTH DAILY 200 tablet 2   hydrochlorothiazide (HYDRODIURIL) 12.5 MG tablet TAKE 1 TABLET BY MOUTH  DAILY (Patient not taking: Reported on 11/17/2022) 90 tablet 3   No current facility-administered medications for this visit.    REVIEW OF SYSTEMS:   Constitutional: Denies fevers, chills or abnormal night sweats (+) fatigue (+) weight loss (intentional) (+) decreased appetite Eyes: Denies blurriness of vision, double vision or watery eyes Ears, nose, mouth, throat, and face: Denies mucositis or sore throat or epistaxis  Respiratory: Denies cough, dyspnea or wheezes Cardiovascular:  Denies palpitation, chest discomfort or lower extremity swelling Gastrointestinal:  Denies nausea, vomiting, constipation, diarrhea, hematochezia, pain, heartburn or change in bowel habits GU: Denies dysuria, hematuria, urgency or frequency (+) cloudy urine  Skin: Denies abnormal skin rashes Lymphatics: Denies new lymphadenopathy or easy bruising Neurological:Denies numbness, tingling (+) weak Behavioral/Psych: Mood is stable, no new changes  All other systems were reviewed with the patient and are negative.  PHYSICAL EXAMINATION: ECOG PERFORMANCE STATUS: 1 - Symptomatic but completely ambulatory  Vitals:   11/17/22 1159  BP: 130/71  Pulse: (!) 115  Resp: 18  Temp: 98.7 F (37.1 C)  SpO2: 99%   Filed Weights   11/17/22 1159  Weight: 211 lb 8 oz (95.9 kg)    GENERAL:alert, no distress and comfortable SKIN: No rash or lesions EYES: sclera clear NECK: Without mass LYMPH:  no palpable cervical, supraclavicular, or axillary lymphadenopathy  LUNGS: clear to auscultation with normal breathing effort HEART: regular rate & rhythm, no lower extremity edema ABDOMEN:abdomen soft, non-tender and normal bowel sounds Musculoskeletal:no cyanosis of digits and no clubbing  PSYCH: alert & oriented x 3 with fluent speech NEURO: no focal motor/sensory deficits  LABORATORY DATA:  I have reviewed the data as listed    Latest Ref Rng & Units 11/17/2022    1:17 PM 11/11/2022    8:32 AM 11/04/2022    2:39 PM  CBC  WBC 4.0 - 10.5 K/uL 7.6  6.8  8.6   Hemoglobin 13.0 - 17.0 g/dL 9.2  9.0  9.2   Hematocrit 39.0 - 52.0 % 27.7  26.3 Repeated and verified X2.  27.5   Platelets 150 - 400 K/uL 225  291.0  343.0        Latest Ref Rng & Units 11/17/2022    1:17 PM 11/04/2022    2:39 PM 11/02/2022    3:38 PM  CMP  Glucose 70 - 99 mg/dL 104  117    117  120   BUN 8 - 23 mg/dL 17  32    32  43   Creatinine 0.61 - 1.24 mg/dL  1.40  1.71    1.71  1.73   Sodium 135 - 145 mmol/L 134  135    135  134    Potassium 3.5 - 5.1 mmol/L 4.0  3.9    3.9  3.9   Chloride 98 - 111 mmol/L 106  103    103  103   CO2 22 - 32 mmol/L '24  26    26  24   '$ Calcium 8.9 - 10.3 mg/dL 10.8  9.8    9.8  9.7   Total Protein 6.5 - 8.1 g/dL 8.6  8.5  8.2   Total Bilirubin 0.3 - 1.2 mg/dL 0.3  0.3  0.4   Alkaline Phos 38 - 126 U/L 77  65  64   AST 15 - 41 U/L '15  18  20   '$ ALT 0 - 44 U/L '11  18  22      '$ RADIOGRAPHIC STUDIES: I have personally reviewed the radiological images as listed and agreed with the findings in the report. No results found.  ASSESSMENT & PLAN: 77 yo male with   Gammopathy  -Discovered in 2016 with thrombocytopenia and elevated serum protein work up. 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 Burr Medico then discharged from f/up back to PCP -repeat panel today  Anemia, sickle cell trait -He reportedly had a blood transfusion 20 years ago for acute blood loss, the source of bleeding was not identified.  -I reviewed his historical records, he had remote anemia in 2009, then mild and intermittent in 2018 - 2019 with hgb in 12 range -CBC 05/06/20 showed hgb 12.5 which has down trended currently 9.0 as of 11/11/22, platelets normal lately. Denies obvious bleeding.  -on oral iron in the past, none in past year. Takes ASA periodically    -FOBT+ GI consult later this month. Last colonoscopy 2022 (Dr. Hilarie Fredrickson) showed 1 polyp and hemorrhoids. -Will proceed with further anemia work up to r/o hemolysis and nutritional anemia   -He has CKD and other co-morbidities. Will check EPO level. I discussed the likelihood he has a component of anemia of chronic disease  -F/up in 2-3 weeks when work up is back, if he has progressive anemia and/or develops other cytopenias, may  consider bone marrow biopsy   Fatigue/weakness, decreased appetite, intentional weight loss  -Onset 2 months ago, he could not finish exercise class.  -He was losing weight intentionally at that time ~20 lbs/4 months -Denies change in bowel habits, pain or any other specific symptoms. Exam is benign -will see what GI consult reveals. 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  -Under active surveillance per urology   -Denies urinary sx except cloudy urine  HTN, HL, CKD, AAA -Per PCP and other care team  -Scr up to 1.8 in the past, currently 1.4 -likely has a component of anemia of chronic disease    PLAN: -Outside medical records reviewed -Lab today (CBC, CMP, EPO, Retic, LDH, haptoglobin, ferritin, iron/TIBC, B12, folate, MMA, light chains, and SPEP/IFE/QIG) -F/up in 2-3 weeks with results and next steps -GI consult 2/23 for +FOBT  and worsening anemia  Orders Placed This Encounter  Procedures   CBC with Differential (Cancer Center Only)    Standing Status:   Future    Number of Occurrences:   1    Standing Expiration Date:   11/18/2023   CMP (Cancer Center only)    Standing Status:   Future    Number of Occurrences:   1    Standing Expiration Date:   11/18/2023   Draw extra clot specimen    Standing Status:   Future    Number of Occurrences:   1    Standing Expiration Date:   11/18/2023   Ferritin    Standing Status:   Future    Number of Occurrences:   1    Standing Expiration Date:   11/18/2023   Iron and Iron Binding Capacity ([facility] only)    Standing Status:   Future    Number of Occurrences:   1    Standing Expiration Date:   11/18/2023   Retic Panel    Standing Status:   Future    Number of Occurrences:   1    Standing Expiration Date:   11/18/2023   Folate RBC     Standing Status:   Future    Number of Occurrences:   1    Standing Expiration Date:   11/17/2023   Erythropoietin    Standing Status:   Future    Number of Occurrences:   1    Standing Expiration Date:   11/17/2023   Haptoglobin    Standing Status:   Future    Number of Occurrences:   1    Standing Expiration Date:   11/17/2023   Vitamin B12    Standing Status:   Future    Number of Occurrences:   1    Standing Expiration Date:   11/17/2023   Methylmalonic acid, serum    Standing Status:   Future    Number of Occurrences:   1    Standing Expiration Date:   11/17/2023   Multiple Myeloma Panel (SPEP&IFE w/QIG)    Standing Status:   Future    Number of Occurrences:   1    Standing Expiration Date:   11/17/2023   Kappa/lambda light chains    Standing Status:   Future    Number of Occurrences:   1    Standing Expiration Date:   11/17/2023   Lactate dehydrogenase (LDH)    Standing Status:   Future    Number of Occurrences:   1    Standing Expiration Date:   11/17/2023   Sample to Blood Bank    Standing Status:   Future    Number of Occurrences:   1    Standing Expiration Date:   11/18/2023       All questions were answered. The patient knows to call the clinic with any problems, questions or concerns. I spent 40 minutes counseling the patient face to face. The total time spent in the appointment was 60 minutes and more than 50% was on counseling.     Alla Feeling, NP 11/18/22

## 2022-11-17 NOTE — Telephone Encounter (Signed)
[facility] to get last office visit note and pathology report, spoke with Larkin Ina he faxed these results over to me and I gave them to Cira Rue NP.

## 2022-11-18 ENCOUNTER — Encounter: Payer: Self-pay | Admitting: Nurse Practitioner

## 2022-11-18 LAB — FOLATE RBC
Folate, Hemolysate: 336 ng/mL
Folate, RBC: 1244 ng/mL (ref >498)
Hematocrit: 27 % — ABNORMAL LOW (ref 37.5–51.0)

## 2022-11-18 LAB — KAPPA/LAMBDA LIGHT CHAINS
Kappa free light chain: 163.6 mg/L — ABNORMAL HIGH (ref 3.3–19.4)
Kappa, lambda light chain ratio: 1.62 (ref 0.26–1.65)
Lambda free light chains: 100.7 mg/L — ABNORMAL HIGH (ref 5.7–26.3)

## 2022-11-18 LAB — ERYTHROPOIETIN: Erythropoietin: 19.8 m[IU]/mL — ABNORMAL HIGH (ref 2.6–18.5)

## 2022-11-19 LAB — HAPTOGLOBIN: Haptoglobin: 175 mg/dL (ref 34–355)

## 2022-11-22 LAB — METHYLMALONIC ACID, SERUM: Methylmalonic Acid, Quantitative: 140 nmol/L (ref 0–378)

## 2022-11-23 ENCOUNTER — Encounter: Payer: Self-pay | Admitting: Hematology

## 2022-11-24 LAB — MULTIPLE MYELOMA PANEL, SERUM
Albumin SerPl Elph-Mcnc: 3.2 g/dL (ref 2.9–4.4)
Albumin/Glob SerPl: 0.7 (ref 0.7–1.7)
Alpha 1: 0.3 g/dL (ref 0.0–0.4)
Alpha2 Glob SerPl Elph-Mcnc: 0.7 g/dL (ref 0.4–1.0)
B-Globulin SerPl Elph-Mcnc: 0.9 g/dL (ref 0.7–1.3)
Gamma Glob SerPl Elph-Mcnc: 3.4 g/dL — ABNORMAL HIGH (ref 0.4–1.8)
Globulin, Total: 5.3 g/dL — ABNORMAL HIGH (ref 2.2–3.9)
IgA: 227 mg/dL (ref 61–437)
IgG (Immunoglobin G), Serum: 3554 mg/dL — ABNORMAL HIGH (ref 603–1613)
IgM (Immunoglobulin M), Srm: 167 mg/dL — ABNORMAL HIGH (ref 15–143)
Total Protein ELP: 8.5 g/dL (ref 6.0–8.5)

## 2022-11-30 NOTE — Assessment & Plan Note (Signed)
-  Discovered in 2016 with thrombocytopenia and elevated serum protein work up. 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

## 2022-11-30 NOTE — Progress Notes (Unsigned)
[place]   Telephone:(336) [phone] Fax:(336) [phone]   Clinic Follow up Note   Patient Care Team: Marin Olp, MD as PCP - General (Family Medicine) Buford Dresser, MD as PCP - Cardiology (Cardiology) Buford Dresser, MD as Consulting Physician (Cardiology) Edythe Clarity, [facility] (Pharmacist) Truitt Merle, MD as Attending Physician (Hematology and Oncology) Alla Feeling, NP as Nurse Practitioner (Hematology and Oncology)  Date of Service:  12/01/2022  CHIEF COMPLAINT: f/u of H/o gammopathy and worsening anemia, referred by PCP Dr. Garret Reddish   CURRENT THERAPY:    ASSESSMENT:  Caleb Horton is a 77 y.o. male with   Gammopathy -Discovered in 2016 with thrombocytopenia and elevated serum protein work up. 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  Anemia of chronic disease -Lab work from November 17, 2022 showed no evidence of nutritional anemia, although ferritin is on the low end of normal.  He has started oral iron -will monitor.    PLAN: -Discuss recentlab results, no MGUS or multiple myeloma -Lab reviewed-mild Anemia -Recommend Prenatal Vitamin or oral iron  -f/u 1 year    INTERVAL HISTORY:  Caleb Horton is here for a follow up of gammopathy  He was last seen by NP Lacie on 11/17/2022 He presents to the clinic accompanied by wife. Pt states he is taking a Iron supplement     All other systems were reviewed with the patient and are  negative.  MEDICAL HISTORY:  Past Medical History:  Diagnosis Date   Anemia    Hypertension    INGROWN NAIL 11/10/2010   Annotation: bilateral  Qualifier: Diagnosis of  By: Arnoldo Morale MD, Balinda Quails    Sickle cell trait [facility])     SURGICAL HISTORY: Past Surgical History:  Procedure Laterality Date   CIRCUMCISION     age 89   COLONOSCOPY      I have reviewed the social history and family history with the patient and they are unchanged from previous note.  ALLERGIES:  has No Known Allergies.  MEDICATIONS:  Current Outpatient Medications  Medication Sig Dispense Refill   atorvastatin (LIPITOR) 10 MG tablet TAKE 1 TABLET BY MOUTH  DAILY 90 tablet 3   COVID-19 mRNA vaccine 2023-2024 (COMIRNATY) syringe Inject into the muscle. 0.3 mL 0   hydrochlorothiazide (HYDRODIURIL) 12.5 MG tablet TAKE 1 TABLET BY MOUTH  DAILY (Patient not taking: Reported on 11/17/2022) 90 tablet 3   ibuprofen (ADVIL) 800 MG tablet Take 800 mg by mouth 3 (three) times daily.     influenza vaccine adjuvanted (FLUAD) 0.5 ML injection Inject into the muscle. 0.5 mL 0   Omega-3 Fatty Acids (FISH OIL) 1000 MG CAPS Take by mouth.     RSV vaccine recomb adjuvanted (AREXVY) 120 MCG/0.5ML injection Inject into the muscle. 0.5 mL 0   triamcinolone ointment (KENALOG) 0.5 % Apply 1 Application topically 2 (two) times daily. Up to 7 days as needed for skin irritation such as poison ivy 60 g 1   valsartan (DIOVAN) 80 MG tablet TAKE  2 TABLETS BY MOUTH DAILY 200 tablet 2   No current facility-administered medications for this visit.    PHYSICAL EXAMINATION: ECOG PERFORMANCE STATUS: 1 - Symptomatic but completely ambulatory  Vitals:   12/01/22 1119  BP: 133/72  Pulse: 97  Resp: 17  Temp: 97.8 F (36.6 C)  SpO2: 99%   Wt Readings from Last 3 Encounters:  12/01/22 214 lb 8 oz (97.3 kg)  11/17/22 211 lb 8 oz (95.9 kg)  11/02/22 215 lb 3.2 oz (97.6 kg)     GENERAL:alert, no distress and comfortable SKIN: skin color normal,  no rashes or significant lesions EYES: normal, Conjunctiva are pink and non-injected, sclera clear  NEURO: alert & oriented x 3 with fluent speech   LABORATORY DATA:  I have reviewed the data as listed    Latest Ref Rng & Units 11/17/2022    1:17 PM 11/11/2022    8:32 AM 11/04/2022    2:39 PM  CBC  WBC 4.0 - 10.5 K/uL 7.6  6.8  8.6   Hemoglobin 13.0 - 17.0 g/dL 9.2  9.0  9.2   Hematocrit 37.5 - 51.0 % 39.0 - 52.0 % 27.0    27.7  26.3 Repeated and verified X2.  27.5   Platelets 150 - 400 K/uL 225  291.0  343.0         Latest Ref Rng & Units 11/17/2022    1:17 PM 11/04/2022    2:39 PM 11/02/2022    3:38 PM  CMP  Glucose 70 - 99 mg/dL 104  117    117  120   BUN 8 - 23 mg/dL 17  32    32  43   Creatinine 0.61 - 1.24 mg/dL 1.40  1.71    1.71  1.73   Sodium 135 - 145 mmol/L 134  135    135  134   Potassium 3.5 - 5.1 mmol/L 4.0  3.9    3.9  3.9   Chloride 98 - 111 mmol/L 106  103    103  103   CO2 22 - 32 mmol/L 24  26    26  24   $ Calcium 8.9 - 10.3 mg/dL 10.8  9.8    9.8  9.7   Total Protein 6.5 - 8.1 g/dL 8.6  8.5  8.2   Total Bilirubin 0.3 - 1.2 mg/dL 0.3  0.3  0.4   Alkaline Phos 38 - 126 U/L 77  65  64   AST 15 - 41 U/L 15  18  20   $ ALT 0 - 44 U/L 11  18  22       $ RADIOGRAPHIC STUDIES: I have personally reviewed the radiological images as listed and agreed with the findings in the report. No results found.    No orders of the defined types were placed in this encounter.  All questions were answered. The patient knows to call the clinic with any problems, questions or concerns. No barriers to learning was detected. The total time spent in the appointment was 25 minutes.     Truitt Merle, MD 12/01/2022   Felicity Coyer, CMA, am acting as scribe for Truitt Merle, MD.   I have reviewed the above documentation for accuracy and completeness, and I agree with the above.

## 2022-12-01 ENCOUNTER — Inpatient Hospital Stay (HOSPITAL_BASED_OUTPATIENT_CLINIC_OR_DEPARTMENT_OTHER): Payer: Medicare Other | Admitting: Hematology

## 2022-12-01 ENCOUNTER — Encounter: Payer: Self-pay | Admitting: Hematology

## 2022-12-01 VITALS — BP 133/72 | HR 97 | Temp 97.8°F | Resp 17 | Ht 73.0 in | Wt 214.5 lb

## 2022-12-01 DIAGNOSIS — D472 Monoclonal gammopathy: Secondary | ICD-10-CM | POA: Diagnosis not present

## 2022-12-03 ENCOUNTER — Encounter: Payer: Self-pay | Admitting: Nurse Practitioner

## 2022-12-03 ENCOUNTER — Ambulatory Visit: Payer: Medicare Other | Admitting: Nurse Practitioner

## 2022-12-03 ENCOUNTER — Other Ambulatory Visit (INDEPENDENT_AMBULATORY_CARE_PROVIDER_SITE_OTHER): Payer: Medicare Other

## 2022-12-03 VITALS — BP 130/60 | HR 100 | Ht 73.0 in | Wt 214.0 lb

## 2022-12-03 DIAGNOSIS — Z86010 Personal history of colonic polyps: Secondary | ICD-10-CM

## 2022-12-03 DIAGNOSIS — K449 Diaphragmatic hernia without obstruction or gangrene: Secondary | ICD-10-CM | POA: Diagnosis not present

## 2022-12-03 DIAGNOSIS — R195 Other fecal abnormalities: Secondary | ICD-10-CM

## 2022-12-03 DIAGNOSIS — D509 Iron deficiency anemia, unspecified: Secondary | ICD-10-CM

## 2022-12-03 LAB — CBC
HCT: 32 % — ABNORMAL LOW (ref 39.0–52.0)
Hemoglobin: 10.5 g/dL — ABNORMAL LOW (ref 13.0–17.0)
MCHC: 32.9 g/dL (ref 30.0–36.0)
MCV: 83.4 fl (ref 78.0–100.0)
Platelets: 197 10*3/uL (ref 150.0–400.0)
RBC: 3.84 Mil/uL — ABNORMAL LOW (ref 4.22–5.81)
RDW: 19.7 % — ABNORMAL HIGH (ref 11.5–15.5)
WBC: 6 10*3/uL (ref 4.0–10.5)

## 2022-12-03 NOTE — Patient Instructions (Addendum)
Your provider has requested that you go to the basement level for lab work before leaving today. Press "B" on the elevator. The lab is located at the first door on the left as you exit the elevator.  You have been scheduled for an endoscopy. Please follow written instructions given to you at your visit today. If you use inhalers (even only as needed), please bring them with you on the day of your procedure.  Due to recent changes in healthcare laws, you may see the results of your imaging and laboratory studies on MyChart before your provider has had a chance to review them.  We understand that in some cases there may be results that are confusing or concerning to you. Not all laboratory results come back in the same time frame and the provider may be waiting for multiple results in order to interpret others.  Please give [place] 48 hours in order for your provider to thoroughly review all the results before contacting the office for clarification of your results.    Thank you for trusting me with your gastrointestinal care!   Carl Best, CRNP

## 2022-12-03 NOTE — Progress Notes (Addendum)
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 He is known by Dr. Hilarie Fredrickson. He presents to our office today as referred by Dr. Annie Main Hunger for further evaluation regarding iron deficiency anemia with a positive FOBT.  Labs 11/17/2022 showed an iron level of 26. Saturation ratios 10%.  TIBC 258.  Ferritin 39.  Vitamin B12 level 900.  Folate 1,244. Elevated erythropoietin level 19.8.  Reticulocyte count 1.6. He was seen by his hematologist, Dr. Burr Medico, who assessed his anemia was likely due to chronic disease and recent laboratory studies were negative for multiple myeloma or MGUS. He restarted taking oral iron 2 or 3 weeks ago.  He denies having any heartburn or dysphagia. No upper or lower abdominal pain. No N/V. He typically passes a normal formed brown BM daily, however, since he "restarted" taking oral iron 2 1/2 weeks ago his stools are darker brown, almost black. He sometimes feels his hemorrhoids prolapse without associated anorectal pain or bright red rectal bleeding. He tucks the hemorrhoids back inside the rectum as needed. No recent hemorrhoidal symptoms. He does not strain to pass a BM.  He developed an ice pica 10 to 15 years ago and he stated his PCP told him to take oral iron and his ice cravings abated. He continued taking oral iron since then but stopped taking it 3 to 4 months ago because he didn't think he needed it and he didn't want to take "so many pills".    He had a near syncopal episode, felt near faint while attending a parade 20 years ago and he was sent to the ED for a blood transfusion and apparently did not require any other  work up. No further blood transfusions since then. No known family history of anemia or other blood disorders.   He tries to avoid NSAIDS as previously instructed by Dr. Yong Channel. However, about one month ago he took ASA for a few days before he had a tooth extracted. He was prescribed Ibuprofen '800mg'$  tid by his dentist but he did not take it.   He has a history of tubulovillous adenomatous colon polyps per colonoscopy in 2005 and 2010. His most recent colonoscopy was 10/19/2020 which identified 1 evolving tubular adenomatous polyp removed from the ascending colon, diverticulosis in the sigmoid colon and internal and external hemorrhoids.  He was advised to repeat a colonoscopy in 5 years.  He drinks 3 to 4 shots of gin weekly.  No fever, sweats or chills. He is intentionally trying to lose weight by exercising and eating smaller portions.     Latest Ref Rng & Units 11/17/2022    1:17 PM 11/11/2022    8:32 AM 11/04/2022    2:39 PM  CBC  WBC 4.0 - 10.5 K/uL 7.6  6.8  8.6   Hemoglobin 13.0 - 17.0 g/dL 9.2  9.0  9.2   Hematocrit 37.5 - 51.0 % 39.0 - 52.0 % 27.0    27.7  26.3 Repeated and verified X2.  27.5   Platelets 150 - 400 K/uL 225  291.0  343.0        Latest Ref Rng & Units 11/17/2022    1:17 PM 11/04/2022  2:39 PM 11/02/2022    3:38 PM  CMP  Glucose 70 - 99 mg/dL 104  117    117  120   BUN 8 - 23 mg/dL 17  32    32  43   Creatinine 0.61 - 1.24 mg/dL 1.40  1.71    1.71  1.73   Sodium 135 - 145 mmol/L 134  135    135  134   Potassium 3.5 - 5.1 mmol/L 4.0  3.9    3.9  3.9   Chloride 98 - 111 mmol/L 106  103    103  103   CO2 22 - 32 mmol/L '24  26    26  24   '$ Calcium 8.9 - 10.3 mg/dL 10.8  9.8    9.8  9.7   Total Protein 6.5 - 8.1 g/dL 8.6  8.5  8.2   Total Bilirubin 0.3 - 1.2 mg/dL 0.3  0.3  0.4   Alkaline Phos 38 - 126 U/L 77  65  64   AST 15 - 41 U/L '15  18  20   '$ ALT 0 - 44 U/L '11  18  22     '$ FOBT + on 11/09/2022  PAST GI PROCEDURES:  Colonoscopy 10/19/2020: Bowel prep  was good  Surgical [P], biopsy site: ascending polyp(1) - EARLY/EVOLVING TUBULAR ADENOMA. - NEGATIVE FOR HIGH GRADE DYSPLASIA. -5-year recall colonoscopy  Colonoscopy 06/26/2015: Mild diverticulosis was noted in the sigmoid colon The examination was otherwise normal Repeat colonoscopy in 5 years given history of tubulovillous adenoma  Colonoscopy 09/15/2009: Diverticula, scattered in the sigmoid to descending colon Pedunculated polyp removed from the sigmoid colon Otherwise normal examination 1. - TUBULOVILLOUS ADENOMA.   - high grade dysplasia is not identified.   - the base appears negative for dysplasia   EGD 02/27/2004: 4 cm hiatal hernia Gastritis  Colonoscopy 09/23/2004: 6 mm sessile polyp removed from the rectum Rectum, polyp: Tubulovillous adenoma.  No high-grade dysplasia or malignancy identified.  Current Outpatient Medications on File Prior to Visit  Medication Sig Dispense Refill   atorvastatin (LIPITOR) 10 MG tablet TAKE 1 TABLET BY MOUTH  DAILY 90 tablet 3   hydrochlorothiazide (HYDRODIURIL) 12.5 MG tablet TAKE 1 TABLET BY MOUTH  DAILY 90 tablet 3   ibuprofen (ADVIL) 800 MG tablet Take 800 mg by mouth 3 (three) times daily.     influenza vaccine adjuvanted (FLUAD) 0.5 ML injection Inject into the muscle. 0.5 mL 0   Omega-3 Fatty Acids (FISH OIL) 1000 MG CAPS Take by mouth.     RSV vaccine recomb adjuvanted (AREXVY) 120 MCG/0.5ML injection Inject into the muscle. 0.5 mL 0   triamcinolone ointment (KENALOG) 0.5 % Apply 1 Application topically 2 (two) times daily. Up to 7 days as needed for skin irritation such as poison ivy 60 g 1   valsartan (DIOVAN) 80 MG tablet TAKE 2 TABLETS BY MOUTH DAILY 200 tablet 2   No current facility-administered medications on file prior to visit.   No Known Allergies  Current Outpatient Medications on File Prior to Visit  Medication Sig Dispense Refill   atorvastatin (LIPITOR) 10 MG tablet TAKE 1 TABLET BY MOUTH  DAILY 90 tablet 3    hydrochlorothiazide (HYDRODIURIL) 12.5 MG tablet TAKE 1 TABLET BY MOUTH  DAILY 90 tablet 3   ibuprofen (ADVIL) 800 MG tablet Take 800 mg by mouth 3 (three) times daily.     influenza vaccine adjuvanted (FLUAD) 0.5 ML injection Inject into the muscle. 0.5 mL 0  Omega-3 Fatty Acids (FISH OIL) 1000 MG CAPS Take by mouth.     RSV vaccine recomb adjuvanted (AREXVY) 120 MCG/0.5ML injection Inject into the muscle. 0.5 mL 0   triamcinolone ointment (KENALOG) 0.5 % Apply 1 Application topically 2 (two) times daily. Up to 7 days as needed for skin irritation such as poison ivy 60 g 1   valsartan (DIOVAN) 80 MG tablet TAKE 2 TABLETS BY MOUTH DAILY 200 tablet 2   No current facility-administered medications on file prior to visit.   No Known Allergies  Current Medications, Allergies, Past Medical History, Past Surgical History, Family History and Social History were reviewed in [employer] record.  Review of Systems:   Constitutional: Negative for fever, sweats, chills or weight loss.  Respiratory: Negative for shortness of breath.   Cardiovascular: Negative for chest pain, palpitations and leg swelling.  Gastrointestinal: See HPI.  Musculoskeletal: Negative for back pain or muscle aches.  Neurological: Negative for dizziness, headaches or paresthesias.   Physical Exam: BP 130/60   Pulse 100   Ht '6\' 1"'$  (1.854 m)   Wt 214 lb (97.1 kg)   BMI 28.23 kg/m   Wt Readings from Last 3 Encounters:  12/03/22 214 lb (97.1 kg)  12/01/22 214 lb 8 oz (97.3 kg)  11/17/22 211 lb 8 oz (95.9 kg)    General: 77 year old male in no acute distress. Head: Normocephalic and atraumatic. Eyes: No scleral icterus. Conjunctiva pink . Ears: Normal auditory acuity. Mouth: Dentition intact. No ulcers or lesions.  Lungs: Clear throughout to auscultation. Heart: Regular rate and rhythm, no murmur. Abdomen: Soft, nontender and nondistended. No masses or hepatomegaly. Normal bowel sounds x 4  quadrants.  Rectal: Patient did not wish to undergo a rectal exam/hemorrhoid check at this time as his hemorrhoids are not bothersome at this time.  Musculoskeletal: Symmetrical with no gross deformities. Extremities: No edema. Neurological: Alert oriented x 4. No focal deficits.  Psychological: Alert and cooperative. Normal mood and affect  Assessment and Recommendations:  77 year old male with iron deficiency anemia with + FOBT. No overt GI bleeding. Remote history of ice pica 10 - 15 years ago for which he took oral iron daily then stopped it 3 to 4 months ago. Since then, his Hg level has drifted downward. Restarted on oral iron 3 weeks ago.  Hematology evaluation was unrevealing. -CBC -EGD to rule out UGI etiology for IDA, rule out cameron lesions in setting of known hiatal hernia -I will consult with Dr. Hilarie Fredrickson to verify if a colonoscopy is warranted at this juncture. His most recent colonoscopy 10/2020 showed a small adenomatous polyp, diverticulosis and internal/external hemorrhoids. -Patient to present to the ED if he develops CP, SOB or profound fatigue.  -Consider CTAP if endoscopic evaluation unrevealing  History of tubulovillous adenomatous colon polyps per colonoscopy in 2005 in 2010.  His most recent colonoscopy 10/2020 identified a 4 mm tubular adenomatous polyp removed from the ascending colon, diverticulosis to the sigmoid colon and internal/external hemorrhoids.  Gammopathy with thrombocytopenia, followed hematology  CKD stage III  History of prostate cancer

## 2022-12-03 NOTE — Progress Notes (Deleted)
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  He is known by Dr. Hilarie Fredrickson.  He presents to our office today as referred by Dr. Annie Main Hunger for further evaluation regarding iron deficiency anemia with a positive FOBT.  Labs 11/17/2022 showed an iron level of 26. Saturation ratios 10%.  TIBC 258.  Ferritin 39.  Vitamin B12 level 900.  Folate 1,244. Elevated erythropoietin level 19.8.  Reticulocyte count 1.6. He was seen by his hematologist, Dr. Burr Medico, who assessed his anemia was likely due to chronic disease and recent laboratory studies were negative for multiple myeloma or MGUS. He was started on oral iron.    He denies having any heartburn or dysphagia. No upper or lower abdominal pain. No N/V. He is passing a brown BM daily. He started taking iron 10 to 15 years ago, started back a few months. Since starting iron, stools look almost black.   His dentist prescribed Ibuprofen.  He took a few Aspirins prior to a dental extraction one month. He did not take Ibuprofen. He is attempting to lose weight. Exercise, reduced sugar. 2 years.   His most recent colonoscopy was 10/19/2020 which identified 1 evolving tubular adenomatous polyp removed from the ascending colon, diverticulosis in the sigmoid colon and internal and external hemorrhoids.  He was advised to repeat a colonoscopy in 5 years.  Hemorrhoids prolapse.   Ice PICA 10 to 15 years ago. IDA, treated with oral iron.  Stopped iron 3 or 4 months ago, took a break.  OTC iron.        Latest Ref Rng & Units 11/17/2022    1:17 PM 11/11/2022    8:32 AM 11/04/2022    2:39 PM  CBC  WBC 4.0 - 10.5 K/uL 7.6  6.8   8.6   Hemoglobin 13.0 - 17.0 g/dL 9.2  9.0  9.2   Hematocrit 37.5 - 51.0 % 39.0 - 52.0 % 27.0    27.7  26.3 Repeated and verified X2.  27.5   Platelets 150 - 400 K/uL 225  291.0  343.0        Latest Ref Rng & Units 11/17/2022    1:17 PM 11/04/2022    2:39 PM 11/02/2022    3:38 PM  CMP  Glucose 70 - 99 mg/dL 104  117    117  120   BUN 8 - 23 mg/dL 17  32    32  43   Creatinine 0.61 - 1.24 mg/dL 1.40  1.71    1.71  1.73   Sodium 135 - 145 mmol/L 134  135    135  134   Potassium 3.5 - 5.1 mmol/L 4.0  3.9    3.9  3.9   Chloride 98 - 111 mmol/L 106  103    103  103   CO2 22 - 32 mmol/L '24  26    26  24   '$ Calcium 8.9 - 10.3 mg/dL 10.8  9.8    9.8  9.7   Total Protein 6.5 - 8.1 g/dL 8.6  8.5  8.2   Total Bilirubin 0.3 - 1.2 mg/dL 0.3  0.3  0.4   Alkaline Phos 38 - 126 U/L 77  65  64   AST 15 - 41 U/L '15  18  20   '$ ALT 0 - 44 U/L '11  18  22     '$ FOBT + on 11/09/2022  Colonoscopy 10/19/2020:  Surgical [P], biopsy site: ascending polyp(1) - EARLY/EVOLVING TUBULAR ADENOMA. - NEGATIVE FOR HIGH GRADE DYSPLASIA. -5-year recall colonoscopy  Colonoscopy 06/26/2015: Mild diverticulosis was noted in the sigmoid colon The examination was otherwise normal Repeat colonoscopy in 5 years given history of tubulovillous adenoma  Colonoscopy 09/15/2009: Diverticula, scattered in the sigmoid to descending colon Pedunculated polyp removed from the sigmoid colon Otherwise normal examination 1. - TUBULOVILLOUS ADENOMA.   - high grade dysplasia is not identified.   - the base appears negative for dysplasia   EGD 02/27/2004: 4 cm hiatal hernia Gastritis  Colonoscopy 09/23/2004: 6 mm sessile polyp removed from the rectum Rectum, polyp: Tubulovillous adenoma.  No high-grade dysplasia or malignancy identified.  Current Medications, Allergies, Past Medical History, Past Surgical History, Family History and Social History were reviewed in [employer] record.   Review of  Systems:   Constitutional: Negative for fever, sweats, chills or weight loss.  Respiratory: Negative for shortness of breath.   Cardiovascular: Negative for chest pain, palpitations and leg swelling.  Gastrointestinal: See HPI.  Musculoskeletal: Negative for back pain or muscle aches.  Neurological: Negative for dizziness, headaches or paresthesias.    Physical Exam: BP 130/60   Pulse 100   Ht '6\' 1"'$  (1.854 m)   Wt 214 lb (97.1 kg)   BMI 28.23 kg/m   Wt Readings from Last 3 Encounters:  12/03/22 214 lb (97.1 kg)  12/01/22 214 lb 8 oz (97.3 kg)  11/17/22 211 lb 8 oz (95.9 kg)    General: in no acute distress. Head: Normocephalic and atraumatic. Eyes: No scleral icterus. Conjunctiva pink . Ears: Normal auditory acuity. Mouth: Dentition intact. No ulcers or lesions.  Lungs: Clear throughout to auscultation. Heart: Regular rate and rhythm, no murmur. Abdomen: Soft, nontender and nondistended. No masses or hepatomegaly. Normal bowel sounds x 4 quadrants.  Rectal: *** Musculoskeletal: Symmetrical with no gross deformities. Extremities: No edema. Neurological: Alert oriented x 4. No focal deficits.  Psychological: Alert and cooperative. Normal mood and affect  Assessment and Recommendations:  77 year old male with anemia  History of tubulovillous adenomatous colon polyps per colonoscopy in 2005 in 2010.  His most recent colonoscopy 10/2020 identified a 4 mm tubular adenomatous polyp removed from the ascending colon, diverticulosis to the sigmoid colon and internal/external hemorrhoids.  CKD stage III  History of prostate cancer

## 2022-12-03 NOTE — Progress Notes (Signed)
Addendum: Reviewed and agree with assessment and management plan. Agree with EGD without colonoscopy given recent exam in 2022 and normal exam before that in 2016 Closely monitor CBC, iron panels VCE and possible CT if EGD neg JMP Nardos Putnam, Lajuan Lines, MD

## 2022-12-06 ENCOUNTER — Encounter: Payer: Self-pay | Admitting: Family Medicine

## 2022-12-06 ENCOUNTER — Ambulatory Visit (INDEPENDENT_AMBULATORY_CARE_PROVIDER_SITE_OTHER): Payer: Medicare Other | Admitting: Family Medicine

## 2022-12-06 VITALS — BP 128/70 | HR 66 | Temp 97.4°F | Ht 73.0 in | Wt 212.6 lb

## 2022-12-06 DIAGNOSIS — I10 Essential (primary) hypertension: Secondary | ICD-10-CM | POA: Diagnosis not present

## 2022-12-06 DIAGNOSIS — D649 Anemia, unspecified: Secondary | ICD-10-CM | POA: Diagnosis not present

## 2022-12-06 DIAGNOSIS — R8290 Unspecified abnormal findings in urine: Secondary | ICD-10-CM | POA: Diagnosis not present

## 2022-12-06 DIAGNOSIS — R300 Dysuria: Secondary | ICD-10-CM

## 2022-12-06 NOTE — Patient Instructions (Addendum)
Please stop by lab before you go- just for urine If you have mychart- we will send your results within 3 business days of [place] receiving them.  If you do not have mychart- we will call you about results within 5 business days of [place] receiving them.  *please also note that you will see labs on mychart as soon as they post. I will later go in and write notes on them- will say "notes from Dr. Yong Channel"   Blood pressure looks great- lets remain on valsartan '160mg'$  alone- hold off on hydrochlorothiazide   I do need to know even if everything looks good with GI - if you continue to lose weight would consider scans of abdomen and chest and bloodwork  Recommended follow up: Return for next already scheduled visit or sooner if needed. -potentially sooner depending on workup with GI

## 2022-12-06 NOTE — Progress Notes (Signed)
Phone [phone] In person visit   Subjective:   Caleb Horton is a 77 y.o. year old very pleasant male patient who presents for/with See problem oriented charting Chief Complaint  Patient presents with   Follow-up    (No Mask).   Hypertension    Pt states he is no longer taking one of the bp meds but he does not know which one   Hyperlipidemia   Past Medical History-  Patient Active Problem List   Diagnosis Date Noted   Prostate cancer ([place]) 08/31/2017    Priority: High   Gammopathy 03/20/2015    Priority: High   AAA (abdominal aortic aneurysm) ([place]) 03/17/2017    Priority: Medium    Idiopathic neuropathy 02/07/2017    Priority: Medium    Hyperlipidemia 09/04/2014    Priority: Medium    CKD (chronic kidney disease), 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    Priority: Medium    Essential hypertension 11/27/2007    Priority: Medium    History of adenomatous polyp of colon 08/05/2015    Priority: Low   Elevated blood protein 02/04/2015    Priority: Low   Hyperglycemia 11/27/2007    Priority: Low   Sickle-cell trait ([place]) 11/27/2007    Priority: Low   BPH (benign prostatic hyperplasia) 11/27/2007    Priority: Low   Stage 3 chronic kidney disease, unspecified whether stage 3a or 3b CKD ([place]) 11/02/2022   Thrombocytopenia ([place]) 09/19/2015    Medications- reviewed and updated Current Outpatient Medications  Medication Sig Dispense Refill   ferrous sulfate 325 (65 FE) MG tablet Take 325 mg by mouth daily with breakfast.     atorvastatin (LIPITOR) 10 MG tablet TAKE 1 TABLET BY MOUTH  DAILY 90 tablet 3   Omega-3 Fatty Acids (FISH OIL) 1000 MG CAPS Take by mouth.     triamcinolone ointment (KENALOG) 0.5 % Apply 1 Application topically 2 (two) times daily. Up to 7 days as needed for skin irritation such as poison ivy 60 g 1   valsartan (DIOVAN) 80 MG tablet TAKE 2 TABLETS BY MOUTH DAILY 200 tablet 2   No current facility-administered medications for this visit.     Objective:  BP  128/70   Pulse 66   Temp (!) 97.4 F (36.3 C)   Ht '6\' 1"'$  (1.854 m)   Wt 212 lb 9.6 oz (96.4 kg)   SpO2 97%   BMI 28.05 kg/m  Gen: NAD, resting comfortably CV: RRR no murmurs rubs or gallops Lungs: CTAB no crackles, wheeze, rhonchi Ext: no edema Skin: warm, dry    Assessment and Plan   #hypertension S: medication:  valsartan '160Mg'$  -last visit we held HCTZ 12.'5Mg'$  BP Readings from Last 3 Encounters:  12/06/22 128/70  12/03/22 130/60  12/01/22 133/72  A/P: Blood pressure well-controlled despite stopping hydrochlorothiazide-continue current medication  # Anemia S:positive ifob 11/09/22- entered urgent referral to GI and also had him see hematology to see if hematology element-extensive blood work was done and plan was for 2 to 3-week follow-up.  Patient was experiencing fatigue/weakness as well as unintentional weight loss with 20 pounds down to 4 months approximately with some consideration of imaging to evaluate for occult malignancy depending on GI workup (already under active surveillance for elevated PSA/prostate cancer -Hematology on follow-up thought most likely related to chronic disease-no evidence of MGUS or multiple myeloma -They also discussed continuing iron or prenatal vitamin as ferritin on the low end of normal-1 year follow-up recommended -He saw GI on February 23-CBC updated  with plan to complete endoscopy with possibility of colonoscopy though most recent in January 2022 -Thankfully hemoglobin increased back to 10.5 after dipping down to 9 after starting iron  A/P: Anemia possibly of chronic disease but we also need to have potential GI bleed further evaluated-upcoming endoscopy - Of note with prior weight loss if no underlying cause found and weight loss continues we discussed doing a CT abdomen pelvis-he agrees to reach out if he continues to have issues - Prior fatigue is improving as anemia has improved some-just recheck on Friday so we opted to not recheck at  this time  # malodorous urine S:he states in last several weeks- after I saw him last developed foul smelling urine- almost like a bowel movement- also somewhat cloudy. He reports staying well hydrated - had slight burning but now improved - no penile discharge - only active with wife sexually- and no concern for fidlity A/P: Unclear cause.  A1c elevated but not in diabetes range.  Will check urinalysis and urine culture.  Declines need for STD testing  Recommended follow up: Return for next already scheduled visit or sooner if needed. Future Appointments  Date Time Provider [place]  12/20/2022  8:30 AM Pyrtle, Lajuan Lines, MD [facility]  07/06/2023 11:00 AM Marin Olp, MD [facility]  08/04/2023 11:30 AM [facility] HEALTH COACH [facility]  12/05/2023 10:30 AM [facility] LAB [facility] None  12/05/2023 11:00 AM Truitt Merle, MD [facility] None    Lab/Order associations:   ICD-10-CM   1. Dysuria  R30.0 Urinalysis, Routine w reflex microscopic    Urine Culture    2. Malodorous urine  R82.90 Urinalysis, Routine w reflex microscopic    Urine Culture    3. Essential hypertension  I10     4. Anemia, unspecified type  D64.9      No orders of the defined types were placed in this encounter.   Return precautions advised.  Garret Reddish, MD

## 2022-12-08 LAB — URINE CULTURE
MICRO NUMBER:: 14614205
SPECIMEN QUALITY:: ADEQUATE

## 2022-12-09 ENCOUNTER — Other Ambulatory Visit: Payer: Self-pay | Admitting: Family Medicine

## 2022-12-09 MED ORDER — CEPHALEXIN 500 MG PO CAPS: 500.0000 mg | ORAL_CAPSULE | Freq: Three times a day (TID) | ORAL | 0 refills | Status: AC

## 2022-12-16 ENCOUNTER — Encounter: Payer: Medicare Other | Admitting: Internal Medicine

## 2022-12-20 ENCOUNTER — Ambulatory Visit (AMBULATORY_SURGERY_CENTER): Payer: Medicare Other | Admitting: Internal Medicine

## 2022-12-20 ENCOUNTER — Encounter: Payer: Self-pay | Admitting: Internal Medicine

## 2022-12-20 VITALS — BP 143/86 | HR 63 | Temp 97.7°F | Resp 19 | Ht 73.0 in | Wt 214.0 lb

## 2022-12-20 DIAGNOSIS — D509 Iron deficiency anemia, unspecified: Secondary | ICD-10-CM

## 2022-12-20 DIAGNOSIS — R195 Other fecal abnormalities: Secondary | ICD-10-CM

## 2022-12-20 DIAGNOSIS — K2970 Gastritis, unspecified, without bleeding: Secondary | ICD-10-CM

## 2022-12-20 DIAGNOSIS — K3189 Other diseases of stomach and duodenum: Secondary | ICD-10-CM | POA: Diagnosis not present

## 2022-12-20 DIAGNOSIS — K2951 Unspecified chronic gastritis with bleeding: Secondary | ICD-10-CM | POA: Diagnosis not present

## 2022-12-20 DIAGNOSIS — B9681 Helicobacter pylori [H. pylori] as the cause of diseases classified elsewhere: Secondary | ICD-10-CM | POA: Diagnosis not present

## 2022-12-20 DIAGNOSIS — K229 Disease of esophagus, unspecified: Secondary | ICD-10-CM

## 2022-12-20 MED ORDER — SODIUM CHLORIDE 0.9 % IV SOLN: 500.0000 mL | INTRAVENOUS | Status: DC

## 2022-12-20 MED ORDER — PANTOPRAZOLE SODIUM 40 MG PO TBEC: 40.0000 mg | DELAYED_RELEASE_TABLET | Freq: Every day | ORAL | 3 refills | Status: DC

## 2022-12-20 NOTE — Progress Notes (Signed)
Called to room to assist during endoscopic procedure.  Patient ID and intended procedure confirmed with present staff. Received instructions for my participation in the procedure from the performing physician.  

## 2022-12-20 NOTE — Patient Instructions (Addendum)
- Resume previous diet.                           - Continue present medications.                           - Begin pantoprazole 40 mg once daily given                            erythema associated with submucosal antral lesion.                           - Await pathology results.                           - May need EUS for further characterization and                            potential treatment.   YOU HAD AN ENDOSCOPIC PROCEDURE TODAY AT [facility]:   Refer to the procedure report that was given to you for any specific questions about what was found during the examination.  If the procedure report does not answer your questions, please call your gastroenterologist to clarify.  If you requested that your care partner not be given the details of your procedure findings, then the procedure report has been included in a sealed envelope for you to review at your convenience later.  YOU SHOULD EXPECT: Some feelings of bloating in the abdomen. Passage of more gas than usual.  Walking can help get rid of the air that was put into your GI tract during the procedure and reduce the bloating. If you had a lower endoscopy (such as a colonoscopy or flexible sigmoidoscopy) you may notice spotting of blood in your stool or on the toilet paper. If you underwent a bowel prep for your procedure, you may not have a normal bowel movement for a few days.  Please Note:  You might notice some irritation and congestion in your nose or some drainage.  This is from the oxygen used during your procedure.  There is no need for concern and it should clear up in a day or so.  SYMPTOMS TO REPORT IMMEDIATELY:   Following upper endoscopy (EGD)  Vomiting of blood or coffee ground material  New chest pain or pain under the shoulder blades  Painful or persistently difficult swallowing  New shortness of breath  Fever of 100F or higher  Black, tarry-looking stools  For urgent or  emergent issues, a gastroenterologist can be reached at any hour by calling [phone]. Do not use MyChart messaging for urgent concerns.    DIET:  We do recommend a small meal at first, but then you may proceed to your regular diet.  Drink plenty of fluids but you should avoid alcoholic beverages for 24 hours.  ACTIVITY:  You should plan to take it easy for the rest of today and you should NOT DRIVE or use heavy machinery until tomorrow (because of the sedation medicines used during the test).    FOLLOW UP: Our staff will call the number listed on your records the next business day following your procedure.  We will call around 7:15- 8:00 am to check on you and address any questions  or concerns that you may have regarding the information given to you following your procedure. If we do not reach you, we will leave a message.     If any biopsies were taken you will be contacted by phone or by letter within the next 1-3 weeks.  Please call us at [phone] if you have not heard about the biopsies in 3 weeks.    SIGNATURES/CONFIDENTIALITY: You and/or your care partner have signed paperwork which will be entered into your electronic medical record.  These signatures attest to the fact that that the information above on your After Visit Summary has been reviewed and is understood.  Full responsibility of the confidentiality of this discharge information lies with you and/or your care-partner.

## 2022-12-20 NOTE — Progress Notes (Signed)
See office note dated 12/03/2022 for details and current H&P  Patient presenting for upper endoscopy to evaluate iron deficiency anemia and heme positive stool  He remains appropriate for upper endoscopy in the [place] today.

## 2022-12-20 NOTE — Op Note (Signed)
[facility] Patient Name: Caleb Horton Procedure Date: 12/20/2022 8:40 AM MRN: [id number] Endoscopist: Jerene Bears , MD, [id number] Age: 77 Referring MD:  Date of Birth: 1946/06/05 Gender: Male Account #: [id number] Procedure:                Upper GI endoscopy Indications:              Iron deficiency anemia, Heme positive stool Medicines:                Monitored Anesthesia Care Procedure:                Pre-Anesthesia Assessment:                           - Prior to the procedure, a History and Physical                            was performed, and patient medications and                            allergies were reviewed. The patient's tolerance of                            previous anesthesia was also reviewed. The risks                            and benefits of the procedure and the sedation                            options and risks were discussed with the patient.                            All questions were answered, and informed consent                            was obtained. Prior Anticoagulants: The patient has                            taken no anticoagulant or antiplatelet agents. ASA                            Grade Assessment: III - A patient with severe                            systemic disease. After reviewing the risks and                            benefits, the patient was deemed in satisfactory                            condition to undergo the procedure.                           After obtaining informed consent, the endoscope was  passed under direct vision. Throughout the                            procedure, the patient's blood pressure, pulse, and                            oxygen saturations were monitored continuously. The                            GIF [id number] was introduced through the                            mouth, and advanced to the second part of duodenum.                            The upper  GI endoscopy was accomplished without                            difficulty. The patient tolerated the procedure                            well. Scope In: Scope Out: Findings:                 The Z-line was irregular and was found 40 cm from                            the incisors. Biopsies were taken with a cold                            forceps for histology to exclude Barrett's                            metaplasia.                           The exam of the esophagus was otherwise normal.                           A firm approximately 2 cm submucosal lesion with no                            bleeding and erythema at the tip was found in the                            gastric antrum. Tunnelled biopsies were taken with                            a cold forceps for histology.                           Diffuse atrophic mucosa was found in the gastric                            body.  Biopsies were taken with a cold forceps for                            histology and Helicobacter pylori testing.                           The examined duodenum was normal. Biopsies were                            taken with a cold forceps for histology. Complications:            No immediate complications. Estimated Blood Loss:     Estimated blood loss was minimal. Impression:               - Z-line irregular, 40 cm from the incisors.                            Biopsied.                           - Submucosal lesion in the gastric antrum. Biopsied.                           - Gastric mucosal atrophy. Biopsied.                           - Normal examined duodenum. Biopsied. Recommendation:           - Patient has a contact number available for                            emergencies. The signs and symptoms of potential                            delayed complications were discussed with the                            patient. Return to normal activities tomorrow.                            Written discharge  instructions were provided to the                            patient.                           - Resume previous diet.                           - Continue present medications.                           - Begin pantoprazole 40 mg once daily given                            erythema associated with submucosal antral lesion.                           -  Await pathology results.                           - May need EUS for further characterization and                            potential treatment. Jerene Bears, MD 12/20/2022 9:11:40 AM This report has been signed electronically.

## 2022-12-20 NOTE — Progress Notes (Signed)
Sedate, gd SR's, VSS, report to RN 

## 2022-12-21 ENCOUNTER — Telehealth: Payer: Self-pay | Admitting: *Deleted

## 2022-12-21 NOTE — Telephone Encounter (Signed)
Post procedure follow up phone call. No answer at number given.  Left message on voicemail.  

## 2022-12-28 ENCOUNTER — Other Ambulatory Visit: Payer: Self-pay

## 2022-12-28 MED ORDER — BISMUTH/METRONIDAZ/TETRACYCLIN 140-125-125 MG PO CAPS: 3.0000 | ORAL_CAPSULE | Freq: Four times a day (QID) | ORAL | 0 refills | Status: DC

## 2023-01-11 ENCOUNTER — Other Ambulatory Visit: Payer: Self-pay

## 2023-01-11 DIAGNOSIS — K3189 Other diseases of stomach and duodenum: Secondary | ICD-10-CM

## 2023-01-13 ENCOUNTER — Telehealth: Payer: Self-pay | Admitting: Internal Medicine

## 2023-01-13 NOTE — Telephone Encounter (Signed)
Pt called wanting to know if he needed to get a refill on his pylera. Discussed with pt that he does not need a refill, just the one course of medication.

## 2023-01-13 NOTE — Telephone Encounter (Signed)
PT is calling to find out if he should continue to take Pylera. Please advise.

## 2023-01-14 ENCOUNTER — Other Ambulatory Visit: Payer: Self-pay | Admitting: Family Medicine

## 2023-02-07 ENCOUNTER — Encounter (HOSPITAL_COMMUNITY): Payer: Self-pay | Admitting: Gastroenterology

## 2023-02-14 ENCOUNTER — Encounter (HOSPITAL_COMMUNITY): Payer: Self-pay | Admitting: Gastroenterology

## 2023-02-14 ENCOUNTER — Other Ambulatory Visit (HOSPITAL_BASED_OUTPATIENT_CLINIC_OR_DEPARTMENT_OTHER): Payer: Self-pay

## 2023-02-14 ENCOUNTER — Other Ambulatory Visit: Payer: Self-pay

## 2023-02-14 ENCOUNTER — Ambulatory Visit (HOSPITAL_COMMUNITY)
Admission: RE | Admit: 2023-02-14 | Discharge: 2023-02-14 | Disposition: A | Discharge status: Home / Self Care | Payer: Medicare Other | Source: Ambulatory Visit | Attending: Gastroenterology | Admitting: Gastroenterology

## 2023-02-14 ENCOUNTER — Encounter (HOSPITAL_COMMUNITY)
Admission: RE | Disposition: A | Discharge status: Home / Self Care | Payer: Self-pay | Source: Ambulatory Visit | Attending: Gastroenterology

## 2023-02-14 ENCOUNTER — Ambulatory Visit (HOSPITAL_BASED_OUTPATIENT_CLINIC_OR_DEPARTMENT_OTHER): Payer: Medicare Other | Admitting: Certified Registered"

## 2023-02-14 ENCOUNTER — Ambulatory Visit (HOSPITAL_COMMUNITY): Payer: Medicare Other | Admitting: Certified Registered"

## 2023-02-14 DIAGNOSIS — I10 Essential (primary) hypertension: Secondary | ICD-10-CM | POA: Diagnosis not present

## 2023-02-14 DIAGNOSIS — K449 Diaphragmatic hernia without obstruction or gangrene: Secondary | ICD-10-CM | POA: Insufficient documentation

## 2023-02-14 DIAGNOSIS — K2289 Other specified disease of esophagus: Secondary | ICD-10-CM | POA: Diagnosis not present

## 2023-02-14 DIAGNOSIS — Z87891 Personal history of nicotine dependence: Secondary | ICD-10-CM | POA: Insufficient documentation

## 2023-02-14 DIAGNOSIS — Z79899 Other long term (current) drug therapy: Secondary | ICD-10-CM | POA: Diagnosis not present

## 2023-02-14 DIAGNOSIS — D509 Iron deficiency anemia, unspecified: Secondary | ICD-10-CM | POA: Insufficient documentation

## 2023-02-14 DIAGNOSIS — D573 Sickle-cell trait: Secondary | ICD-10-CM | POA: Insufficient documentation

## 2023-02-14 DIAGNOSIS — K3189 Other diseases of stomach and duodenum: Secondary | ICD-10-CM | POA: Insufficient documentation

## 2023-02-14 DIAGNOSIS — K229 Disease of esophagus, unspecified: Secondary | ICD-10-CM

## 2023-02-14 DIAGNOSIS — K317 Polyp of stomach and duodenum: Secondary | ICD-10-CM | POA: Diagnosis not present

## 2023-02-14 HISTORY — PX: HEMOSTASIS CLIP PLACEMENT: SHX6857

## 2023-02-14 HISTORY — PX: EUS: SHX5427

## 2023-02-14 HISTORY — PX: SUBMUCOSAL LIFTING INJECTION: SHX6855

## 2023-02-14 HISTORY — PX: ENDOSCOPIC MUCOSAL RESECTION: SHX6839

## 2023-02-14 HISTORY — PX: ESOPHAGOGASTRODUODENOSCOPY: SHX5428

## 2023-02-14 HISTORY — PX: SUBMUCOSAL TATTOO INJECTION: SHX6856

## 2023-02-14 SURGERY — UPPER ENDOSCOPIC ULTRASOUND (EUS) RADIAL
Anesthesia: Monitor Anesthesia Care

## 2023-02-14 MED ORDER — EPHEDRINE SULFATE-NACL 50-0.9 MG/10ML-% IV SOSY
PREFILLED_SYRINGE | INTRAVENOUS | Status: DC | PRN
  Administered 2023-02-14 (×3): 5 mg via INTRAVENOUS

## 2023-02-14 MED ORDER — SODIUM CHLORIDE 0.9 % IV SOLN: INTRAVENOUS | Status: DC

## 2023-02-14 MED ORDER — SUCRALFATE 1 G PO TABS
1.0000 g | ORAL_TABLET | Freq: Two times a day (BID) | ORAL | 0 refills | Status: DC
  Filled 2023-02-14: qty 60, 30d supply, fill #0

## 2023-02-14 MED ORDER — LACTATED RINGERS IV SOLN
INTRAVENOUS | Status: AC | PRN
  Administered 2023-02-14: 1000 mL via INTRAVENOUS

## 2023-02-14 MED ORDER — DEXMEDETOMIDINE HCL IN NACL 80 MCG/20ML IV SOLN
INTRAVENOUS | Status: DC | PRN
  Administered 2023-02-14 (×5): 4 ug via INTRAVENOUS

## 2023-02-14 MED ORDER — SPOT INK MARKER SYRINGE KIT
PACK | SUBMUCOSAL | Status: DC | PRN
  Administered 2023-02-14: 1.5 mL via SUBMUCOSAL

## 2023-02-14 MED ORDER — GLYCOPYRROLATE 0.2 MG/ML IJ SOLN
INTRAMUSCULAR | Status: DC | PRN
  Administered 2023-02-14: .1 mg via INTRAVENOUS

## 2023-02-14 MED ORDER — PANTOPRAZOLE SODIUM 40 MG PO TBEC
40.0000 mg | DELAYED_RELEASE_TABLET | Freq: Two times a day (BID) | ORAL | 6 refills | Status: DC
Start: 2023-02-14 — End: 2024-05-18
  Filled 2023-02-14: qty 60, 30d supply, fill #0
  Filled 2023-03-16: qty 60, 30d supply, fill #1
  Filled 2023-04-20: qty 60, 30d supply, fill #2

## 2023-02-14 MED ORDER — LIDOCAINE HCL (CARDIAC) PF 100 MG/5ML IV SOSY
PREFILLED_SYRINGE | INTRAVENOUS | Status: DC | PRN
  Administered 2023-02-14: 80 mg via INTRAVENOUS
  Administered 2023-02-14: 20 mg via INTRAVENOUS

## 2023-02-14 MED ORDER — PROPOFOL 10 MG/ML IV BOLUS
INTRAVENOUS | Status: DC | PRN
  Administered 2023-02-14: 50 mg via INTRAVENOUS

## 2023-02-14 MED ORDER — PHENYLEPHRINE 80 MCG/ML (10ML) SYRINGE FOR IV PUSH (FOR BLOOD PRESSURE SUPPORT)
PREFILLED_SYRINGE | INTRAVENOUS | Status: DC | PRN
  Administered 2023-02-14 (×7): 80 ug via INTRAVENOUS

## 2023-02-14 MED ORDER — PROPOFOL 500 MG/50ML IV EMUL
INTRAVENOUS | Status: DC | PRN
  Administered 2023-02-14: 150 ug/kg/min via INTRAVENOUS

## 2023-02-14 NOTE — Discharge Instructions (Signed)
YOU HAD AN ENDOSCOPIC PROCEDURE TODAY: Refer to the procedure report and other information in the discharge instructions given to you for any specific questions about what was found during the examination. If this information does not answer your questions, please call [place] office at [phone] to clarify.  ° °YOU SHOULD EXPECT: Some feelings of bloating in the abdomen. Passage of more gas than usual. Walking can help get rid of the air that was put into your GI tract during the procedure and reduce the bloating. If you had a lower endoscopy (such as a colonoscopy or flexible sigmoidoscopy) you may notice spotting of blood in your stool or on the toilet paper. Some abdominal soreness may be present for a day or two, also. ° °DIET: Your first meal following the procedure should be a light meal and then it is ok to progress to your normal diet. A half-sandwich or bowl of soup is an example of a good first meal. Heavy or fried foods are harder to digest and may make you feel nauseous or bloated. Drink plenty of fluids but you should avoid alcoholic beverages for 24 hours. If you had a esophageal dilation, please see attached instructions for diet.   ° °ACTIVITY: Your care partner should take you home directly after the procedure. You should plan to take it easy, moving slowly for the rest of the day. You can resume normal activity the day after the procedure however YOU SHOULD NOT DRIVE, use power tools, machinery or perform tasks that involve climbing or major physical exertion for 24 hours (because of the sedation medicines used during the test).  ° °SYMPTOMS TO REPORT IMMEDIATELY: °A gastroenterologist can be reached at any hour. Please call [phone]  for any of the following symptoms:  °Following lower endoscopy (colonoscopy, flexible sigmoidoscopy) °Excessive amounts of blood in the stool  °Significant tenderness, worsening of abdominal pains  °Swelling of the abdomen that is new, acute  °Fever of 100° or  higher  °Following upper endoscopy (EGD, EUS, ERCP, esophageal dilation) °Vomiting of blood or coffee ground material  °New, significant abdominal pain  °New, significant chest pain or pain under the shoulder blades  °Painful or persistently difficult swallowing  °New shortness of breath  °Black, tarry-looking or red, bloody stools ° °FOLLOW UP:  °If any biopsies were taken you will be contacted by phone or by letter within the next 1-3 weeks. Call [phone]  if you have not heard about the biopsies in 3 weeks.  °Please also call with any specific questions about appointments or follow up tests. ° °

## 2023-02-14 NOTE — Anesthesia Preprocedure Evaluation (Addendum)
Anesthesia Evaluation  Patient identified by MRN, date of birth, ID band Patient awake    Reviewed: Allergy & Precautions, NPO status , Patient's Chart, lab work & pertinent test results  Airway Mallampati: I  TM Distance: >3 FB Neck ROM: Full    Dental no notable dental hx. (+) Teeth Intact, Dental Advisory Given   Pulmonary former smoker   Pulmonary exam normal breath sounds clear to auscultation       Cardiovascular hypertension, Pt. on medications Normal cardiovascular exam Rhythm:Regular Rate:Normal     Neuro/Psych negative neurological ROS  negative psych ROS   GI/Hepatic negative GI ROS, Neg liver ROS,,,  Endo/Other  negative endocrine ROS    Renal/GU Renal InsufficiencyRenal disease  negative genitourinary   Musculoskeletal negative musculoskeletal ROS (+)    Abdominal   Peds  Hematology  (+) Blood dyscrasia, anemia   Anesthesia Other Findings   Reproductive/Obstetrics                             Anesthesia Physical Anesthesia Plan  ASA: 2  Anesthesia Plan: MAC   Post-op Pain Management:    Induction: Intravenous  PONV Risk Score and Plan: Propofol infusion and Treatment may vary due to age or medical condition  Airway Management Planned: Natural Airway  Additional Equipment:   Intra-op Plan:   Post-operative Plan:   Informed Consent: I have reviewed the patients History and Physical, chart, labs and discussed the procedure including the risks, benefits and alternatives for the proposed anesthesia with the patient or authorized representative who has indicated his/her understanding and acceptance.     Dental advisory given  Plan Discussed with: CRNA  Anesthesia Plan Comments:        Anesthesia Quick Evaluation

## 2023-02-14 NOTE — Anesthesia Postprocedure Evaluation (Signed)
Anesthesia Post Note  Patient: [name]  Procedure(s) Performed: UPPER ENDOSCOPIC ULTRASOUND (EUS) RADIAL ESOPHAGOGASTRODUODENOSCOPY (EGD) ENDOSCOPIC MUCOSAL RESECTION SUBMUCOSAL LIFTING INJECTION HEMOSTASIS CLIP PLACEMENT SUBMUCOSAL TATTOO INJECTION     Patient location during evaluation: Endoscopy Anesthesia Type: MAC Level of consciousness: awake and alert Pain management: pain level controlled Vital Signs Assessment: post-procedure vital signs reviewed and stable Respiratory status: spontaneous breathing, nonlabored ventilation, respiratory function stable and patient connected to nasal cannula oxygen Cardiovascular status: blood pressure returned to baseline and stable Postop Assessment: no apparent nausea or vomiting Anesthetic complications: no  No notable events documented.  Last Vitals:  Vitals:   02/14/23 1340 02/14/23 1350  BP: (!) 97/45 113/62  Pulse: 73 76  Resp: (!) 21 (!) 23  Temp:    SpO2: 98% 96%    Last Pain:  Vitals:   02/14/23 1350  TempSrc:   PainSc: 0-No pain                 Sicilia Killough L Ena Demary

## 2023-02-14 NOTE — Transfer of Care (Signed)
Immediate Anesthesia Transfer of Care Note  Patient: Caleb Horton  Procedure(s) Performed: UPPER ENDOSCOPIC ULTRASOUND (EUS) RADIAL ESOPHAGOGASTRODUODENOSCOPY (EGD) ENDOSCOPIC MUCOSAL RESECTION SUBMUCOSAL LIFTING INJECTION HEMOSTASIS CLIP PLACEMENT SUBMUCOSAL TATTOO INJECTION  Patient Location: PACU and Endoscopy Unit  Anesthesia Type:MAC  Level of Consciousness: drowsy  Airway & Oxygen Therapy: Patient Spontanous Breathing and Patient connected to face mask oxygen  Post-op Assessment: Report given to RN and Post -op Vital signs reviewed and stable  Post vital signs: Reviewed and stable  Last Vitals:  Vitals Value Taken Time  BP    Temp    Pulse 66 02/14/23 1332  Resp 21 02/14/23 1332  SpO2 100 % 02/14/23 1332  Vitals shown include unvalidated device data.  Last Pain:  Vitals:   02/14/23 1148  TempSrc: Tympanic         Complications: No notable events documented.

## 2023-02-14 NOTE — H&P (Signed)
GASTROENTEROLOGY PROCEDURE H&P NOTE   Primary Care Physician: Shelva Majestic, MD  HPI: Caleb Horton is a 77 y.o. male who presents for EGD/EUS to evaluate gastric SEL for further evaluation.  Past Medical History:  Diagnosis Date   Anemia    Hypertension    INGROWN NAIL 11/10/2010   Annotation: bilateral  Qualifier: Diagnosis of  By: Lovell Sheehan MD, Balinda Quails    Sickle cell trait [facility])    Past Surgical History:  Procedure Laterality Date   CIRCUMCISION     age 74   COLONOSCOPY     Current Facility-Administered Medications  Medication Dose Route Frequency Provider Last Rate Last Admin   0.9 %  sodium chloride infusion   Intravenous Continuous Mansouraty, Netty Starring., MD        Current Facility-Administered Medications:    0.9 %  sodium chloride infusion, , Intravenous, Continuous, Mansouraty, Netty Starring., MD No Known Allergies Family History  Problem Relation Age of Onset   Hypertension Father    Heart disease Father    Hearing loss Mother    Heart disease Daughter    Obesity Daughter    Obesity Son    Diabetes Maternal Grandmother    Colon cancer Neg Hx    Esophageal cancer Neg Hx    Stomach cancer Neg Hx    Rectal cancer Neg Hx    Social History   Socioeconomic History   Marital status: Married    Spouse name: Ruby   Number of children: Not on file   Years of education: Not on file   Highest education level: Some college, no degree  Occupational History    Employer: RETIRED  Tobacco Use   Smoking status: Former    Packs/day: 0.50    Years: 3.00    Additional pack years: 0.00    Total pack years: 1.50    Types: Cigarettes    Quit date: 10/11/1968    Years since quitting: 54.3   Smokeless tobacco: Never  Vaping Use   Vaping Use: Never used  Substance and Sexual Activity   Alcohol use: Yes    Alcohol/week: 5.0 standard drinks of alcohol    Types: 1 Shots of liquor, 4 Standard drinks or equivalent per week    Comment: once a week    Drug use: No    Sexual activity: Not on file  Other Topics Concern   Not on file  Social History Narrative   Married (3rd marriage). 5 children (3 from 1st, 2 from second), 7 grandkids.       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 He lives with his wife in a 2 story house. He drinks one cup of coffee QOD, and occasionally drinks tea.   Social Determinants of Health   Financial Resource Strain: Low Risk  (07/29/2022)   Overall Financial Resource Strain (CARDIA)    Difficulty of Paying Living Expenses: Not hard at all  Food Insecurity: No Food Insecurity (07/29/2022)   Hunger Vital Sign    Worried About Running Out of Food in the Last Year: Never true    Ran Out of Food in the Last Year: Never true  Transportation Needs: No Transportation Needs (07/29/2022)   PRAPARE - Administrator, Civil Service (Medical): No    Lack of Transportation (Non-Medical): No  Physical Activity: Sufficiently Active (07/29/2022)   Exercise Vital Sign  Days of Exercise per Week: 6 days    Minutes of Exercise per Session: 90 min  Stress: No Stress Concern Present (07/29/2022)   [employer] of Occupational Health - Occupational Stress Questionnaire    Feeling of Stress : Not at all  Social Connections: Moderately Integrated (07/29/2022)   Social Connection and Isolation Panel [NHANES]    Frequency of Communication with Friends and Family: More than three times a week    Frequency of Social Gatherings with Friends and Family: More than three times a week    Attends Religious Services: Never    Database administrator or Organizations: Yes    Attends Banker Meetings: 1 to 4 times per year    Marital Status: Married  Catering manager Violence: Not At Risk (07/29/2022)   Humiliation, Afraid, Rape, and Kick questionnaire    Fear of Current or Ex-Partner: No    Emotionally Abused: No    Physically Abused: No     Sexually Abused: No    Physical Exam: Today's Vitals   02/07/23 1617  Weight: 100.2 kg   Body mass index is 29.16 kg/m. GEN: NAD EYE: Sclerae anicteric ENT: MMM CV: Non-tachycardic GI: Soft, NT/ND NEURO:  Alert & Oriented x 3  Lab Results: No results for input(s): "WBC", "HGB", "HCT", "PLT" in the last 72 hours. BMET No results for input(s): "NA", "K", "CL", "CO2", "GLUCOSE", "BUN", "CREATININE", "CALCIUM" in the last 72 hours. LFT No results for input(s): "PROT", "ALBUMIN", "AST", "ALT", "ALKPHOS", "BILITOT", "BILIDIR", "IBILI" in the last 72 hours. PT/INR No results for input(s): "LABPROT", "INR" in the last 72 hours.   Impression / Plan: This is a 77 y.o.male who presents for EGD/EUS to evaluate gastric SEL for further evaluation.  The risks of an EUS including intestinal perforation, bleeding, infection, aspiration, and medication effects were discussed as was the possibility it may not give a definitive diagnosis if a biopsy is performed.  When a biopsy of the pancreas is done as part of the EUS, there is an additional risk of pancreatitis at the rate of about 1-2%.  It was explained that procedure related pancreatitis is typically mild, although it can be severe and even life threatening, which is why we do not perform random pancreatic biopsies and only biopsy a lesion/area we feel is concerning enough to warrant the risk.  The risks and benefits of endoscopic evaluation/treatment were discussed with the patient and/or family; these include but are not limited to the risk of perforation, infection, bleeding, missed lesions, lack of diagnosis, severe illness requiring hospitalization, as well as anesthesia and sedation related illnesses.  The patient's history has been reviewed, patient examined, no change in status, and deemed stable for procedure.  The patient and/or family is agreeable to proceed.    Caleb Parish, MD [facility] Advanced  Endoscopy Office # [phone]

## 2023-02-14 NOTE — Op Note (Signed)
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 Rhea Belton, MD, Aldine Contes. Hunter MD Medicines:                Monitored Anesthesia Care Complications:            No immediate complications. Estimated Blood Loss:     Estimated blood loss was minimal. Procedure:                Pre-Anesthesia Assessment:                           - Prior to the procedure, a History and Physical                            was performed, and patient medications and                            allergies were reviewed. The patient's tolerance of                            previous anesthesia was also reviewed. The risks                            and benefits of the procedure and the sedation                            options and risks were discussed with the patient.                            All questions were answered, and informed consent                            was obtained. Prior Anticoagulants: The patient has  taken no anticoagulant or antiplatelet agents. ASA                            Grade Assessment: II - A patient with mild systemic                            disease. After reviewing the risks and benefits,                            the patient was deemed in satisfactory condition to                            undergo the procedure.                            After obtaining informed consent, the endoscope was                            passed under direct vision. Throughout the                            procedure, the patient's blood pressure, pulse, and                            oxygen saturations were monitored continuously. The                            [id number] ([id number]) Olympus therapeutic endoscope                            was introduced through the mouth, and advanced to                            the second part of duodenum. The [id number]                            ([id number]) Olympus radial ultrasound scope was                            introduced through the mouth, and advanced to the                            stomach for ultrasound examination from the                            esophagus and stomach. The [id number] ([id number])                            Olympus Forward View EUS was introduced through the                            mouth, and advanced to the stomach for ultrasound  examination. The upper EUS was accomplished without                            difficulty. The patient tolerated the procedure. Scope In: Scope Out: Findings:      ENDOSCOPIC FINDING: :      No gross lesions were noted in the entire esophagus.      The Z-line was irregular and was found 43 cm from the incisors.      A 1 cm hiatal hernia was present.      A single 25 mm submucosal papule (nodule) with no bleeding and stigmata       of potential recent bleeding (large erosion on it) was found in the       gastric antrum posterior aspect. After the EUS was completed and it was       apparent that this did not involve the submucosa, preparations were made       for attempt at mucosal resection since the patient has evidence of iron       deficiency anemia and this looked to be a potential source for chronic       blood loss. Demarcation of the lesion was performed with high-definition       white light and  narrow band imaging to clearly identify the boundaries       of the lesion. EverLift was injected to raise the lesion. Snare mucosal       resection was performed. Resection and retrieval were complete. Resected       tissue margins were examined and clear of polyp tissue. To prevent       bleeding after mucosal resection, four hemostatic clips were       successfully placed (MR conditional). Clip manufacturer: [employer]. There was no bleeding during, or at the end, of the       procedure. Area just proximal on the posterior wall of the antrum/body       was tattooed with an injection of Spot (carbon black) for demarcation       purposes.      Patchy mildly erythematous mucosa without bleeding was found in the       entire examined stomach. Previously biopsied so not rebiopsied today.      No gross lesions were noted in the duodenal bulb, in the first portion       of the duodenum and in the second portion of the duodenum.      ENDOSONOGRAPHIC FINDING: :      An oval intramural (subepithelial) lesion was found in the antrum of the       stomach. The lesion was hypoechoic. Sonographically, the lesion appeared       to originate from the deep mucosa (Layer 2). The lesion measured 22 mm       (in maximum thickness). The lesion also measured 11 mm in diameter. The       outer endosonographic borders were well defined.      Endosonographic imaging in the visualized portion of the liver showed no       mass.      The celiac region was visualized. Impression:               EGD impression:                           -  No gross lesions in the entire esophagus. Z-line                            irregular, 43 cm from the incisors.                           - 1 cm hiatal hernia.                           - A single submucosal papule (nodule) found in the                            posterior wall of the antrum. After EUS was                            completed, mucosal resection  performed with what                            appeared to be complete removal of the lesion.                            Clips (MR conditional) were placed. Clip                            manufacturer: [employer]. Tattooed proximal                            to resection site.                           - Erythematous mucosa in the stomach (previously                            biopsied).                           - No gross lesions in the duodenal bulb, in the                            first portion of the duodenum and in the second                            portion of the duodenum.                           EUS impression:                           - An intramural (subepithelial) lesion was found in                            the antrum of the stomach. The lesion appeared to                            originate from within the deep  mucosa (Layer 2).                            Tissue has not been obtained. However, the                            endosonographic appearance is suspicious for a                            fibroid polyp though other layer 2 lesions should                            be considered as well. Moderate Sedation:      Not Applicable - Patient had care per Anesthesia. Recommendation:           - The patient will be observed post-procedure,                            until all discharge criteria are met.                           - Discharge patient to home.                           - Patient has a contact number available for                            emergencies. The signs and symptoms of potential                            delayed complications were discussed with the                            patient. Return to normal activities tomorrow.                            Written discharge instructions were provided to the                            patient.                           - Full liquid diet today.                           - Observe patient's  clinical course.                           - Increase PPI to 40 mg twice daily for next 2                            months.                           - Carafate 1 g twice daily for 1 month.                           -  No aspirin, ibuprofen, naproxen, or other                            non-steroidal anti-inflammatory drugs for 1 week.                           - Observe patient's clinical course.                           - Await path results.                           - The findings and recommendations were discussed                            with the patient.                           - The findings and recommendations were discussed                            with the patient's family. Procedure Code(s):        --- Professional ---                           [phone], Esophagogastroduodenoscopy, flexible,                            transoral; with endoscopic mucosal resection                           43237, Esophagogastroduodenoscopy, flexible,                            transoral; with endoscopic ultrasound examination                            limited to the esophagus, stomach or duodenum, and                            adjacent structures Diagnosis Code(s):        --- Professional ---                           K22.89, Other specified disease of esophagus                           K44.9, Diaphragmatic hernia without obstruction or                            gangrene                           K31.89, Other diseases of stomach and duodenum                           D50.9, Iron deficiency anemia, unspecified CPT copyright 2022 American Medical Association. All  rights reserved. The codes documented in this report are preliminary and upon coder review may  be revised to meet current compliance requirements. Corliss Parish, MD 02/14/2023 1:45:06 PM Number of Addenda: 0

## 2023-02-16 LAB — SURGICAL PATHOLOGY

## 2023-02-17 ENCOUNTER — Encounter (HOSPITAL_COMMUNITY): Payer: Self-pay | Admitting: Gastroenterology

## 2023-02-18 ENCOUNTER — Encounter: Payer: Self-pay | Admitting: Gastroenterology

## 2023-03-16 ENCOUNTER — Other Ambulatory Visit (HOSPITAL_BASED_OUTPATIENT_CLINIC_OR_DEPARTMENT_OTHER): Payer: Self-pay

## 2023-03-16 ENCOUNTER — Other Ambulatory Visit: Payer: Self-pay | Admitting: Gastroenterology

## 2023-03-16 MED ORDER — SUCRALFATE 1 G PO TABS
1.0000 g | ORAL_TABLET | Freq: Two times a day (BID) | ORAL | 0 refills | Status: DC
  Filled 2023-03-16: qty 60, 30d supply, fill #0

## 2023-03-21 ENCOUNTER — Other Ambulatory Visit: Payer: Self-pay | Admitting: Family Medicine

## 2023-04-05 ENCOUNTER — Other Ambulatory Visit: Payer: Self-pay | Admitting: Family Medicine

## 2023-04-20 ENCOUNTER — Other Ambulatory Visit: Payer: Self-pay | Admitting: Gastroenterology

## 2023-04-20 ENCOUNTER — Other Ambulatory Visit (HOSPITAL_BASED_OUTPATIENT_CLINIC_OR_DEPARTMENT_OTHER): Payer: Self-pay

## 2023-04-28 ENCOUNTER — Emergency Department (HOSPITAL_COMMUNITY): Payer: Medicare Other

## 2023-04-28 ENCOUNTER — Other Ambulatory Visit (HOSPITAL_BASED_OUTPATIENT_CLINIC_OR_DEPARTMENT_OTHER): Payer: Self-pay

## 2023-04-28 ENCOUNTER — Other Ambulatory Visit: Payer: Self-pay

## 2023-04-28 ENCOUNTER — Encounter (HOSPITAL_COMMUNITY): Payer: Self-pay

## 2023-04-28 ENCOUNTER — Emergency Department (HOSPITAL_COMMUNITY)
Admission: EM | Admit: 2023-04-28 | Discharge: 2023-04-28 | Disposition: A | Discharge status: Home / Self Care | Payer: Medicare Other | Attending: Emergency Medicine | Admitting: Emergency Medicine

## 2023-04-28 DIAGNOSIS — R531 Weakness: Secondary | ICD-10-CM

## 2023-04-28 DIAGNOSIS — R509 Fever, unspecified: Secondary | ICD-10-CM | POA: Insufficient documentation

## 2023-04-28 DIAGNOSIS — Z8546 Personal history of malignant neoplasm of prostate: Secondary | ICD-10-CM | POA: Diagnosis not present

## 2023-04-28 DIAGNOSIS — I129 Hypertensive chronic kidney disease with stage 1 through stage 4 chronic kidney disease, or unspecified chronic kidney disease: Secondary | ICD-10-CM | POA: Diagnosis not present

## 2023-04-28 DIAGNOSIS — Z20822 Contact with and (suspected) exposure to covid-19: Secondary | ICD-10-CM | POA: Diagnosis not present

## 2023-04-28 DIAGNOSIS — N189 Chronic kidney disease, unspecified: Secondary | ICD-10-CM | POA: Diagnosis not present

## 2023-04-28 DIAGNOSIS — N390 Urinary tract infection, site not specified: Secondary | ICD-10-CM

## 2023-04-28 DIAGNOSIS — B999 Unspecified infectious disease: Secondary | ICD-10-CM

## 2023-04-28 HISTORY — DX: Polyneuropathy, unspecified: G62.9

## 2023-04-28 LAB — CBC WITH DIFFERENTIAL/PLATELET
Abs Immature Granulocytes: 0.05 10*3/uL (ref 0.00–0.07)
Basophils Absolute: 0 10*3/uL (ref 0.0–0.1)
Basophils Relative: 0 %
Eosinophils Absolute: 0 10*3/uL (ref 0.0–0.5)
Eosinophils Relative: 0 %
HCT: 34.3 % — ABNORMAL LOW (ref 39.0–52.0)
Hemoglobin: 11.3 g/dL — ABNORMAL LOW (ref 13.0–17.0)
Immature Granulocytes: 0 %
Lymphocytes Relative: 8 %
Lymphs Abs: 0.9 10*3/uL (ref 0.7–4.0)
MCH: 27.2 pg (ref 26.0–34.0)
MCHC: 32.9 g/dL (ref 30.0–36.0)
MCV: 82.7 fL (ref 80.0–100.0)
Monocytes Absolute: 1 10*3/uL (ref 0.1–1.0)
Monocytes Relative: 9 %
Neutro Abs: 9.2 10*3/uL — ABNORMAL HIGH (ref 1.7–7.7)
Neutrophils Relative %: 83 %
Platelets: 146 10*3/uL — ABNORMAL LOW (ref 150–400)
RBC: 4.15 MIL/uL — ABNORMAL LOW (ref 4.22–5.81)
RDW: 16.5 % — ABNORMAL HIGH (ref 11.5–15.5)
WBC: 11.2 10*3/uL — ABNORMAL HIGH (ref 4.0–10.5)
nRBC: 0 % (ref 0.0–0.2)

## 2023-04-28 LAB — COMPREHENSIVE METABOLIC PANEL
ALT: 19 U/L (ref 0–44)
AST: 34 U/L (ref 15–41)
Albumin: 3.2 g/dL — ABNORMAL LOW (ref 3.5–5.0)
Alkaline Phosphatase: 69 U/L (ref 38–126)
Anion gap: 8 (ref 5–15)
BUN: 16 mg/dL (ref 8–23)
CO2: 21 mmol/L — ABNORMAL LOW (ref 22–32)
Calcium: 9.5 mg/dL (ref 8.9–10.3)
Chloride: 101 mmol/L (ref 98–111)
Creatinine, Ser: 1.6 mg/dL — ABNORMAL HIGH (ref 0.61–1.24)
GFR, Estimated: 45 mL/min — ABNORMAL LOW (ref >60)
Glucose, Bld: 141 mg/dL — ABNORMAL HIGH (ref 70–99)
Potassium: 3.4 mmol/L — ABNORMAL LOW (ref 3.5–5.1)
Sodium: 130 mmol/L — ABNORMAL LOW (ref 135–145)
Total Bilirubin: 1 mg/dL (ref 0.3–1.2)
Total Protein: 8.7 g/dL — ABNORMAL HIGH (ref 6.5–8.1)

## 2023-04-28 LAB — URINALYSIS, W/ REFLEX TO CULTURE (INFECTION SUSPECTED)
Bilirubin Urine: NEGATIVE
Glucose, UA: NEGATIVE mg/dL
Ketones, ur: NEGATIVE mg/dL
Nitrite: NEGATIVE
Protein, ur: 100 mg/dL — AB
Specific Gravity, Urine: 1.01 (ref 1.005–1.030)
WBC, UA: >50 WBC/hpf (ref 0–5)
pH: 5 (ref 5.0–8.0)

## 2023-04-28 LAB — PROTIME-INR
INR: 1.2 (ref 0.8–1.2)
Prothrombin Time: 15.7 seconds — ABNORMAL HIGH (ref 11.4–15.2)

## 2023-04-28 LAB — RESP PANEL BY RT-PCR (RSV, FLU A&B, COVID)  RVPGX2
Influenza A by PCR: NEGATIVE
Influenza B by PCR: NEGATIVE
Resp Syncytial Virus by PCR: NEGATIVE
SARS Coronavirus 2 by RT PCR: NEGATIVE

## 2023-04-28 LAB — I-STAT CG4 LACTIC ACID, ED: Lactic Acid, Venous: 1.4 mmol/L (ref 0.5–1.9)

## 2023-04-28 LAB — APTT: aPTT: 26 seconds (ref 24–36)

## 2023-04-28 MED ORDER — LACTATED RINGERS IV BOLUS (SEPSIS)
1000.0000 mL | Freq: Once | INTRAVENOUS | Status: AC
  Administered 2023-04-28: 1000 mL via INTRAVENOUS

## 2023-04-28 MED ORDER — CEFPODOXIME PROXETIL 200 MG PO TABS
200.0000 mg | ORAL_TABLET | Freq: Two times a day (BID) | ORAL | 0 refills | Status: AC
  Filled 2023-04-28: qty 20, 10d supply, fill #0

## 2023-04-28 MED ORDER — ACETAMINOPHEN 500 MG PO TABS
1000.0000 mg | ORAL_TABLET | Freq: Once | ORAL | Status: AC
  Administered 2023-04-28: 1000 mg via ORAL
  Filled 2023-04-28: qty 2

## 2023-04-28 MED ORDER — SODIUM CHLORIDE 0.9 % IV SOLN
1.0000 g | Freq: Once | INTRAVENOUS | Status: AC
  Administered 2023-04-28: 1 g via INTRAVENOUS
  Filled 2023-04-28: qty 10

## 2023-04-28 NOTE — ED Triage Notes (Signed)
Pt present to ED from home with c/o BLE. Pt has hx of peripheral neuropathy. Per EMS, wife gave pt medication prior ED arrival, pt unable to voice name of medication. Pt denies pain/discomfort at this time.

## 2023-04-28 NOTE — ED Provider Notes (Signed)
Signed out by [facility]. Stood up after using bathroom and felt weak. No focal deficits. Feeling better now. Found to have temp of 101.6F. Pending urinalysis. Walking trial.   Physical Exam  BP (!) 148/71   Pulse 97   Temp 99.5 F (37.5 C) (Oral)   Resp (!) 25   Ht 6' (1.829 m)   Wt 104.3 kg   SpO2 96%   BMI 31.19 kg/m   Physical Exam Vitals and nursing note reviewed.  Constitutional:      General: He is not in acute distress.    Appearance: He is well-developed.  HENT:     Head: Normocephalic and atraumatic.  Eyes:     Conjunctiva/sclera: Conjunctivae normal.  Cardiovascular:     Rate and Rhythm: Normal rate and regular rhythm.     Heart sounds: No murmur heard. Pulmonary:     Effort: Pulmonary effort is normal. No respiratory distress.     Breath sounds: Normal breath sounds.  Abdominal:     Palpations: Abdomen is soft.     Tenderness: There is no abdominal tenderness.  Musculoskeletal:        General: No swelling.     Cervical back: Neck supple.  Skin:    General: Skin is warm and dry.     Capillary Refill: Capillary refill takes less than 2 seconds.  Neurological:     Mental Status: He is alert.  Psychiatric:        Mood and Affect: Mood normal.     Procedures  Procedures  ED Course / MDM    Medical Decision Making Amount and/or Complexity of Data Reviewed Labs: ordered. Radiology: ordered. ECG/medicine tests: ordered.  Risk OTC drugs. Prescription drug management.   Patient is a 77 year old male, here for weakness that occurred earlier today, he is well-appearing on my exam.  We had him ambulate, he did fairly well, he was leaning forward a little bit, but maintained his weight, and gait okay.  We ordered a walker for increased assistance.  He has been urinary tract infection, and had a fever of 101.4 in the ER.  He denied any suprapubic pain, or symptoms, however given his fever, and overt urinary tract infection we will continue to treat.  He does  not have any back pain, abdominal pain, does not appear ill, or diaphoretic.  Is of well aligned, able to answer questions appropriately.  We discussed possible inpatient admission given his urinary tract infection, with fever and increased weakness, I recommended this, however he declined.  He would rather follow-up with his primary care doctor, and to take the antibiotics at home.  Given his fever, age, and male status, we will treat him for possible complicated UTI, with 1 dose of ceftriaxone, and 10 days of cefpodoxime.  We discussed return precautions and he voiced understanding.  He does have a good support system at home with his wife, and his son is very attentive.       Pete Pelt, [place] 04/28/23 1733    Laurence Spates, MD 04/29/23 1550

## 2023-04-28 NOTE — ED Provider Notes (Signed)
[place] EMERGENCY DEPARTMENT AT [facility] Provider Note   CSN: [id number] Arrival date & time: 04/28/23  1306     History  No chief complaint on file.   Caleb Horton is a 77 y.o. male.  The history is provided by the patient and medical records. No language interpreter was used.     77 year old male with significant history of hypertension, anemia, sickle cell trait, AAA, CKD, prostate cancer brought here via EMS from home for evaluation of weakness.  Patient report today he was in the bathroom having a bowel movement.  When he tries to pull up his pants to get up he reported he was quite weak.  States both of his legs felt weak and he was unable to support himself.  His son did came in to assist him and patient subsequently brought here for further assessment.  Since then he reported he is feeling better now.  He does not endorse any fever or chills no lightheadedness or dizziness no chest pain no shortness of breath no runny nose sneezing or coughing no nausea vomiting or diarrhea no dysuria or hematuria no focal numbness focal weakness.  Denies any recent medication changes denies any recent sick contact.  He normally walks unassisted.  Home Medications Prior to Admission medications   Medication Sig Start Date End Date Taking? Authorizing Provider  atorvastatin (LIPITOR) 10 MG tablet TAKE 1 TABLET BY MOUTH DAILY 01/17/23   Shelva Majestic, MD  Bismuth/Metronidaz/Tetracyclin [facility]) [phone] MG CAPS Take 3 capsules by mouth 4 (four) times daily. Patient not taking: Reported on 02/10/2023 12/28/22   Beverley Fiedler, MD  Coenzyme Q10 (CO Q 10 PO) Take 400 mg by mouth daily.    [provider]  ferrous sulfate 325 (65 FE) MG tablet Take 325 mg by mouth daily with breakfast.    [provider]  Multiple Vitamin (MULTIVITAMIN) capsule Take 1 capsule by mouth daily. Centrum silver    [provider]  Omega-3 Fatty Acids (FISH OIL) 1000 MG CAPS  Take 1,000 mg by mouth daily.    [provider]  pantoprazole (PROTONIX) 40 MG tablet Take 1 tablet (40 mg total) by mouth 2 (two) times daily before a meal. 02/14/23   Mansouraty, Netty Starring., MD  sucralfate (CARAFATE) 1 g tablet Take 1 tablet (1 g total) by mouth 2 (two) times daily. 03/16/23 03/15/24  Mansouraty, Netty Starring., MD  triamcinolone ointment (KENALOG) 0.5 % Apply 1 Application topically 2 (two) times daily. Up to 7 days as needed for skin irritation such as poison ivy 07/01/22   Shelva Majestic, MD  valsartan (DIOVAN) 80 MG tablet TAKE 2 TABLETS BY MOUTH DAILY 09/17/22   Shelva Majestic, MD      Allergies    Patient has no known allergies.    Review of Systems   Review of Systems  All other systems reviewed and are negative.   Physical Exam Updated Vital Signs BP (!) 148/71   Pulse 97   Temp 99.5 F (37.5 C) (Oral)   Resp (!) 25   Ht 6' (1.829 m)   Wt 104.3 kg   SpO2 96%   BMI 31.19 kg/m  Physical Exam Vitals and nursing note reviewed.  Constitutional:      General: He is not in acute distress.    Appearance: He is well-developed.     Comments: Elderly male laying in bed appears to be in no acute discomfort.  HENT:  Head: Atraumatic.  Eyes:     General: No visual field deficit.    Conjunctiva/sclera: Conjunctivae normal.  Cardiovascular:     Rate and Rhythm: Normal rate and regular rhythm.     Pulses: Normal pulses.     Heart sounds: Normal heart sounds.  Pulmonary:     Effort: Pulmonary effort is normal.     Breath sounds: Normal breath sounds.  Abdominal:     Palpations: Abdomen is soft.     Tenderness: There is no abdominal tenderness.  Musculoskeletal:     Cervical back: Normal range of motion and neck supple. No rigidity or tenderness.     Comments: Poor effort but equal strength throughout all 4 extremities  Skin:    Findings: No rash.  Neurological:     Mental Status: He is alert.     GCS: GCS eye subscore is 4. GCS verbal subscore  is 5. GCS motor subscore is 6.     Cranial Nerves: Cranial nerves 2-12 are intact. No cranial nerve deficit, dysarthria or facial asymmetry.     Sensory: Sensation is intact.     Motor: Motor function is intact.     Coordination: Coordination is intact.     Comments: Gait not tested     ED Results / Procedures / Treatments   Labs (all labs ordered are listed, but only abnormal results are displayed) Labs Reviewed  COMPREHENSIVE METABOLIC PANEL - Abnormal; Notable for the following components:      Result Value   Sodium 130 (*)    Potassium 3.4 (*)    CO2 21 (*)    Glucose, Bld 141 (*)    Creatinine, Ser 1.60 (*)    Total Protein 8.7 (*)    Albumin 3.2 (*)    GFR, Estimated 45 (*)    All other components within normal limits  CBC WITH DIFFERENTIAL/PLATELET - Abnormal; Notable for the following components:   WBC 11.2 (*)    RBC 4.15 (*)    Hemoglobin 11.3 (*)    HCT 34.3 (*)    RDW 16.5 (*)    Platelets 146 (*)    Neutro Abs 9.2 (*)    All other components within normal limits  PROTIME-INR - Abnormal; Notable for the following components:   Prothrombin Time 15.7 (*)    All other components within normal limits  RESP PANEL BY RT-PCR (RSV, FLU A&B, COVID)  RVPGX2  CULTURE, BLOOD (ROUTINE X 2)  CULTURE, BLOOD (ROUTINE X 2)  APTT  URINALYSIS, W/ REFLEX TO CULTURE (INFECTION SUSPECTED)  I-STAT CG4 LACTIC ACID, ED  I-STAT CG4 LACTIC ACID, ED    EKG EKG Interpretation Date/Time:  Thursday April 28 2023 13:21:12 EDT Ventricular Rate:  99 PR Interval:  222 QRS Duration:  90 QT Interval:  324 QTC Calculation: 416 R Axis:   45  Text Interpretation: Sinus rhythm Prolonged PR interval No previous tracing Confirmed by Cathren Laine ([id number]) on 04/28/2023 2:32:07 PM  Radiology DG Chest Port 1 View  Result Date: 04/28/2023 CLINICAL DATA:  Questionable sepsis - evaluate for abnormality. EXAM: PORTABLE CHEST 1 VIEW COMPARISON:  None Available. FINDINGS: Clear lungs. Normal heart  size. Atherosclerotic calcifications and tortuosity of the thoracic aorta. No pleural effusion or pneumothorax. Visualized bones and upper abdomen are unremarkable. IMPRESSION: 1. No evidence of acute cardiopulmonary disease. 2.  Aortic Atherosclerosis (ICD10-I70.0). Electronically Signed   By: Orvan Falconer M.D.   On: 04/28/2023 13:51    Procedures Procedures    Medications Ordered  in ED Medications  lactated ringers bolus 1,000 mL (1,000 mLs Intravenous New Bag/Given 04/28/23 1405)  acetaminophen (TYLENOL) tablet 1,000 mg (1,000 mg Oral Given 04/28/23 1403)    ED Course/ Medical Decision Making/ A&P                             Medical Decision Making Amount and/or Complexity of Data Reviewed Labs: ordered. Radiology: ordered. ECG/medicine tests: ordered.  Risk OTC drugs.   BP (!) 146/68 (BP Location: Left Arm)   Pulse 97   Temp (!) 101.4 F (38.6 C) (Oral)   Resp 20   Ht 6' (1.829 m)   Wt 104.3 kg   SpO2 97%   BMI 31.19 kg/m   81:28 PM  77 year old male with significant history of hypertension, anemia, sickle cell trait, AAA, CKD, prostate cancer brought here via EMS from home for evaluation of weakness.  Patient report today he was in the bathroom having a bowel movement.  When he tries to pull up his pants to get up he reported he was quite weak.  States both of his legs felt weak and he was unable to support himself.  His son did came in to assist him and patient subsequently brought here for further assessment.  Since then he reported he is feeling better now.  He does not endorse any fever or chills no lightheadedness or dizziness no chest pain no shortness of breath no runny nose sneezing or coughing no nausea vomiting or diarrhea no dysuria or hematuria no focal numbness focal weakness.  Denies any recent medication changes denies any recent sick contact.  He normally walks unassisted.  On exam patient is resting comfortably appears to be in no acute discomfort.  He is  alert oriented x 4.  He is globally weak but without focal weakness.  He is overall neurovascularly intact.  Heart with normal rate and rhythm, lungs are clear to auscultation bilaterally abdomen is soft nontender.  Skin is warm to the touch   -Labs ordered, independently viewed and interpreted by me.  Labs remarkable for Cr 1.6, improves from prior.  Negative resp panel. Normal lactic acid, UA pending -The patient was maintained on a cardiac monitor.  I personally viewed and interpreted the cardiac monitored which showed an underlying rhythm of: NSR -Imaging independently viewed and interpreted by me and I agree with radiologist's interpretation.  Result remarkable for CXR unremarkable -This patient presents to the ED for concern of weakness, this involves an extensive number of treatment options, and is a complaint that carries with it a high risk of complications and morbidity.  The differential diagnosis includes decondition, sepsis, caudal equina, stroke, TIA, anemia, dehydration, electrolytes imbalance -Co morbidities that complicate the patient evaluation includes CKD, AAA, prostate CA -Treatment includes IVF, Tylenol -Reevaluation of the patient after these medicines showed that the patient improved -PCP office notes or outside notes reviewed -Discussion with oncoming team who will f/u on labs, ambulate pt and determine disposition -Escalation to admission/observation considered: pending work up  .       Final Clinical Impression(s) / ED Diagnoses Final diagnoses:  None    Rx / DC Orders ED Discharge Orders     None         Fayrene Helper, PA-C 04/28/23 1507    Cathren Laine, MD 04/29/23 [phone]

## 2023-04-28 NOTE — ED Notes (Signed)
Called ortho times two for walker, with no answer. Family states that they can come back in the morning to received ordered walker. Voiced to wife and son to call ED in the morning to arrange for walker.

## 2023-04-28 NOTE — Discharge Instructions (Signed)
Today we found that you have a urinary tract infection this is likely causing the weakness, and your fever.  We discussed admission to the hospital, but you would prefer to go home, thus we recommend that you follow-up with your primary care doctor this next week.  Return to the ER if you have worsening weakness, confusion, or abdominal pain.  Your fever and weakness should improve after 48 hours of antibiotics.

## 2023-04-29 LAB — URINE CULTURE: Culture: >=100000 — AB

## 2023-04-29 LAB — CULTURE, BLOOD (ROUTINE X 2): Special Requests: ADEQUATE

## 2023-04-30 LAB — CULTURE, BLOOD (ROUTINE X 2)
Culture: NO GROWTH
Culture: NO GROWTH
Special Requests: ADEQUATE

## 2023-04-30 LAB — URINE CULTURE

## 2023-05-01 ENCOUNTER — Telehealth (HOSPITAL_BASED_OUTPATIENT_CLINIC_OR_DEPARTMENT_OTHER): Payer: Self-pay | Admitting: *Deleted

## 2023-05-01 NOTE — Telephone Encounter (Signed)
Post ED Visit - Positive Culture Follow-up  Culture report reviewed by antimicrobial stewardship pharmacist: [facility] []  Enzo Bi, Pharm.D. []  Celedonio Miyamoto, Pharm.D., BCPS AQ-ID []  Garvin Fila, Pharm.D., BCPS []  Georgina Pillion, Pharm.D., BCPS []  [place], [address].D., BCPS, AAHIVP []  Estella Husk, Pharm.D., BCPS, AAHIVP []  Lysle Pearl, PharmD, BCPS []  Phillips Climes, PharmD, BCPS []  Agapito Games, PharmD, BCPS []  Verlan Friends, PharmD []  Mervyn Gay, PharmD, BCPS [x]  Riccardo Dubin, PharmD  [facility] []  Len Childs, PharmD []  Greer Pickerel, PharmD []  Adalberto Cole, PharmD []  Perlie Gold, Rph []  Lonell Face) Jean Rosenthal, PharmD []  Earl Many, PharmD []  Junita Push, PharmD []  Dorna Leitz, PharmD []  Terrilee Files, PharmD []  Lynann Beaver, PharmD []  Keturah Barre, PharmD []  Loralee Pacas, PharmD []  Bernadene Person, PharmD   Positive urine culture Treated with Cefpodoxime Proxetil, organism sensitive to the same and no further patient follow-up is required at this time.  Caleb Horton 05/01/2023, 12:30 PM

## 2023-05-03 LAB — CULTURE, BLOOD (ROUTINE X 2)

## 2023-05-05 ENCOUNTER — Encounter: Payer: Self-pay | Admitting: Family Medicine

## 2023-05-05 ENCOUNTER — Ambulatory Visit (INDEPENDENT_AMBULATORY_CARE_PROVIDER_SITE_OTHER): Payer: Medicare Other | Admitting: Family Medicine

## 2023-05-05 VITALS — BP 116/70 | HR 76 | Temp 98.7°F | Ht 72.0 in | Wt 222.4 lb

## 2023-05-05 DIAGNOSIS — E785 Hyperlipidemia, unspecified: Secondary | ICD-10-CM

## 2023-05-05 DIAGNOSIS — I10 Essential (primary) hypertension: Secondary | ICD-10-CM

## 2023-05-05 DIAGNOSIS — N390 Urinary tract infection, site not specified: Secondary | ICD-10-CM | POA: Diagnosis not present

## 2023-05-05 NOTE — Progress Notes (Signed)
Phone [phone] In person visit   Subjective:   Caleb Horton is a 77 y.o. year old very pleasant male patient who presents for/with See problem oriented charting Chief Complaint  Patient presents with   ER F/U    Pt is here to f/u from ER visit due to UTI, pt states he is feeling better and symptoms have resolved.   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    Priority: Medium    Essential hypertension 11/27/2007    Priority: Medium    History of adenomatous polyp of colon 08/05/2015    Priority: Low   Elevated blood protein 02/04/2015    Priority: Low   Hyperglycemia 11/27/2007    Priority: Low   Sickle-cell trait (HCC) 11/27/2007    Priority: Low   BPH (benign prostatic hyperplasia) 11/27/2007    Priority: Low   Stage 3 chronic kidney disease, unspecified whether stage 3a or 3b CKD (HCC) 11/02/2022   Thrombocytopenia (HCC) 09/19/2015    Medications- reviewed and updated Current Outpatient Medications  Medication Sig Dispense Refill   atorvastatin (LIPITOR) 10 MG tablet TAKE 1 TABLET BY MOUTH DAILY 90 tablet 3   Bismuth/Metronidaz/Tetracyclin (PYLERA) 140-125-125 MG CAPS Take 3 capsules by mouth 4 (four) times daily. 120 capsule 0   cefpodoxime (VANTIN) 200 MG tablet Take 1 tablet (200 mg total) by mouth 2 (two) times daily for 10 days. 20 tablet 0   Coenzyme Q10 (CO Q 10 PO) Take 400 mg by mouth daily.     ferrous sulfate 325 (65 FE) MG tablet Take 325 mg by mouth daily with breakfast.     Multiple Vitamin (MULTIVITAMIN) capsule Take 1 capsule by mouth daily. Centrum silver     Omega-3 Fatty Acids (FISH  OIL) 1000 MG CAPS Take 1,000 mg by mouth daily.     pantoprazole (PROTONIX) 40 MG tablet Take 1 tablet (40 mg total) by mouth 2 (two) times daily before a meal. 60 tablet 6   sucralfate (CARAFATE) 1 g tablet Take 1 tablet (1 g total) by mouth 2 (two) times daily. 60 tablet 0   triamcinolone ointment (KENALOG) 0.5 % Apply 1 Application topically 2 (two) times daily. Up to 7 days as needed for skin irritation such as poison ivy 60 g 1   valsartan (DIOVAN) 80 MG tablet TAKE 2 TABLETS BY MOUTH DAILY 200 tablet 2   No current facility-administered medications for this visit.     Objective:  BP 116/70   Pulse 76   Temp 98.7 F (37.1 C)   Ht 6' (1.829 m)   Wt 222 lb 6.4 oz (100.9 kg)   SpO2 95%   BMI 30.16 kg/m  Gen: NAD, resting comfortably CV: RRR no murmurs rubs or gallops Lungs: CTAB no crackles, wheeze, rhonchi Abdomen: soft/nontender/nondistended/normal bowel sounds. No rebound or guarding.  Ext: no edema Skin: warm, dry    Assessment and Plan   # ED follow-up for complicated cystitis S: Patient was seen in the department on 04/28/2023-complaint of generalized weakness and found to have fever of 101.  He was unaware of  the source and had no specific symptoms-labs and urinalysis pointed toward potential urinary tract infection with concerning urinalysis findings and leukocytosis on CBC-final culture did show greater than 100,000 colonies of E. coli that were resistant to ampicillin and ampicillin sulbactam but otherwise sensitive-he was treated with a single dose of ceftriaxone and 10 days of cefpodoxime which would provide sufficient coverage.  Other findings on labs was more significant hyponatremia than typical for him.  Respiratory viral panel negative.Blood cultures negative  Today he reports feeling much better and back to baseline-denies fevers or significant fatigue. He reflects back and did have some urinary odor but has not had recurrence since treatment A/P: Patient with  complicated cystitis provoked and generalized fatigue as well as fever leading to emergency room visit-is appropriately treated for his E. coli UTI and has had full resolution of symptoms -He does have underlying prostate cancer on active surveillance as well as underlying BPH which may increase likelihood of UTI-we discussed potentially taking medication like tamsulosin if recurrent issues-he can also discussed with urology -Several mild lab abnormalities including hyponatremia at time hospitalization-recommended CBC and CMP and could even update his A1c for prediabetes but he declines -with hematuria on prior UA offered repeat today- but he prefers to push this until CPE  #Hypertension S: medication: valsartan 160Mg  (takes 2 of the 80 mg tablets) -stopped hydrochlorothiazide 12.5 mg February 2024- lower BP when dealing with anemia. Also with hyponatremia want to avoid BP Readings from Last 3 Encounters:  05/05/23 116/70  04/28/23 (!) 153/78  02/14/23 113/62   A/P: stable- continue current medicines   #hyperlipidemia S: Medication:atorvastatin 10 mg  Lab Results  Component Value Date   CHOL 131 07/01/2022   HDL 46.60 07/01/2022   LDLCALC 70 07/01/2022   LDLDIRECT 81.0 11/13/2020   TRIG 74.0 07/01/2022   CHOLHDL 3 07/01/2022   A/P: controlled last year- will be due for lipid panel next visit- continue current medications   Recommended follow up: Return for physical or sooner if needed.Schedule b4 you leave. Future Appointments  Date Time Provider Department Center  08/04/2023 11:30 AM [facility] ANNUAL WELLNESS VISIT 1 [facility]  11/16/2023 11:00 AM Shelva Majestic, MD [facility]  12/05/2023 10:30 AM [facility] LAB [facility] None  12/05/2023 11:00 AM Malachy Mood, MD [facility] None    Lab/Order associations:   ICD-10-CM   1. Essential hypertension  I10     2. Hyperlipidemia, unspecified hyperlipidemia type  E78.5     3. Complicated UTI (urinary tract infection)  N39.0       No orders of the defined types were placed in this encounter.  Return precautions advised.  Tana Conch, MD

## 2023-05-05 NOTE — Patient Instructions (Addendum)
Let us know if you get any COVID vaccines this fall.  Reschedule physical on way out to next available- sorry but I know I'm going to need to take September 25th off  Glad you are better- if any burning with peeing, frequent urination, new odor let us know as soon as possible   Recommended follow up: Return for physical or sooner if needed.Schedule b4 you leave. - sometime November or later

## 2023-05-11 ENCOUNTER — Other Ambulatory Visit: Payer: Self-pay | Admitting: Family Medicine

## 2023-07-06 ENCOUNTER — Encounter: Payer: Medicare Other | Admitting: Family Medicine

## 2023-07-16 IMAGING — MR MR PROSTATE WO/W CM
12 series · 48 of 48 positions shown · IV contrast (multihance)
Comparison: None.

CLINICAL DATA: Elevated PSA.

EXAM:
MR PROSTATE WITHOUT AND WITH CONTRAST
TECHNIQUE: Multiplanar multisequence MRI images were obtained of the pelvis
centered about the prostate. Pre and post contrast images were
obtained.
CONTRAST:  20mL MULTIHANCE GADOBENATE DIMEGLUMINE 529 MG/ML IV SOLN

[Series 3: T2 · coronal · 3.0mm · 0.56mm/px · 1 of 23 slices shown (1 of 3)]
[im 1/23]
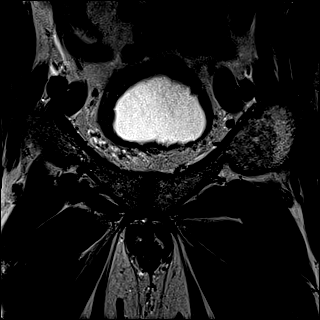

[Series 4: T1 · axial · 5.0mm · 1.25mm/px · 1 of 80 slices shown]
[im 1/80]
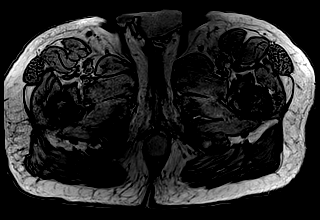

[Series 5: DWI · axial · 3.0mm · 1.75mm/px · 1 of 81 slices shown (1 of 3)]
[im 1/81]
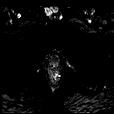

[Series 6: DWI · axial · 3.0mm · 1.75mm/px · 1 of 27 slices shown (2 of 3)]
[im 1/27]
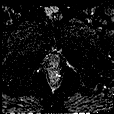

[Series 7: DWI · axial · 3.0mm · 1.75mm/px · 1 of 27 slices shown (3 of 3)]
[im 1/27]
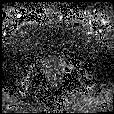

[Series 8: T2 · axial · 3.0mm · 0.56mm/px · 1 of 27 slices shown (2 of 3)]
[im 1/27]
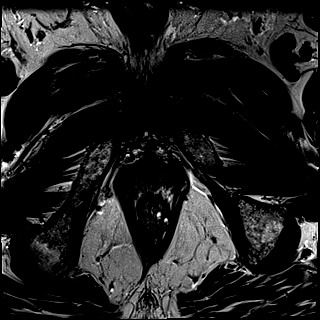

[Series 9: T2 · axial · 1.0mm · 1.04mm/px · z∈[+48,+119]mm · 2 of 72 slices shown (3 of 3)]
[im 1/72]
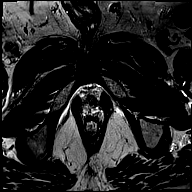
[im 72/72]
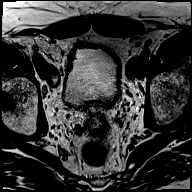

[Series 10: pre t1_twist_tra_dyn · axial · non-contrast · 3.5mm · 0.83mm/px · 1 of 24 slices shown]
[im 1/24]
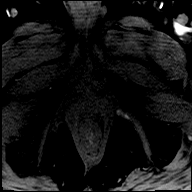

[Series 11: post t1_twist_tra_dyn-copy center · axial · non-contrast · 3.5mm · 0.83mm/px · z∈[+43,+124]mm · 18 of 720 slices shown]
[im 1/720]
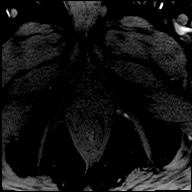
[im 43/720]
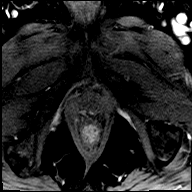
[im 85/720]
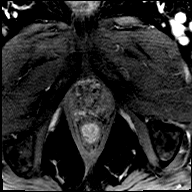
[im 127/720]
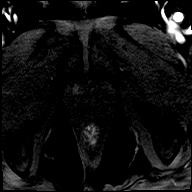
[im 170/720]
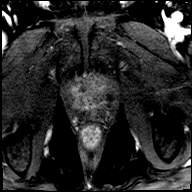
[im 212/720]
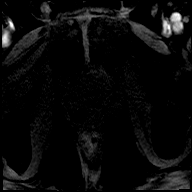
[im 254/720]
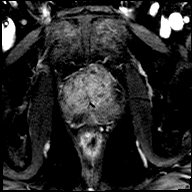
[im 297/720]
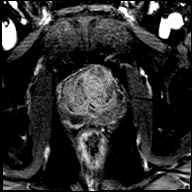
[im 339/720]
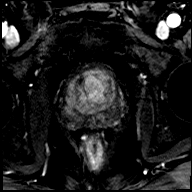
[im 381/720]
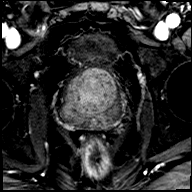
[im 423/720]
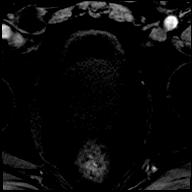
[im 466/720]
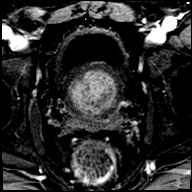
[im 508/720]
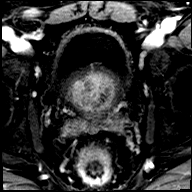
[im 550/720]
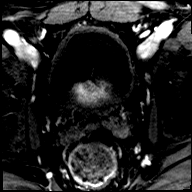
[im 593/720]
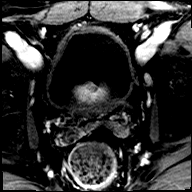
[im 635/720]
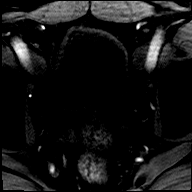
[im 677/720]
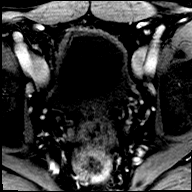
[im 720/720]
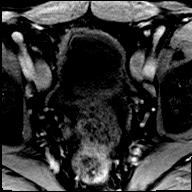

[Series 12: post t1_twist_tra_dyn-copy cent_sub · axial · 3.5mm · 0.83mm/px · z∈[+43,+124]mm · 17 of 696 slices shown]
[im 1/696]
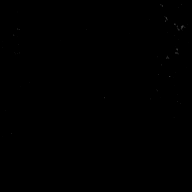
[im 44/696]
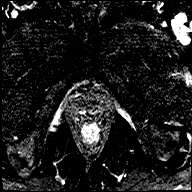
[im 87/696]
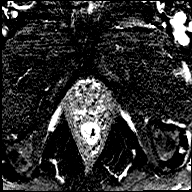
[im 131/696]
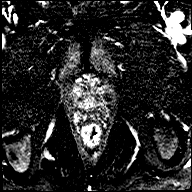
[im 174/696]
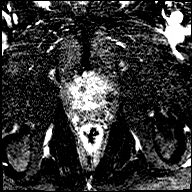
[im 218/696]
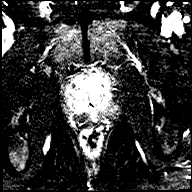
[im 261/696]
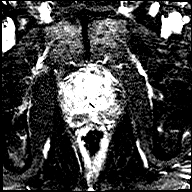
[im 305/696]
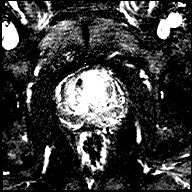
[im 348/696]
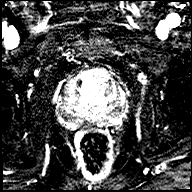
[im 391/696]
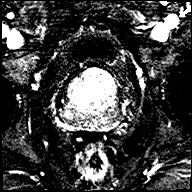
[im 435/696]
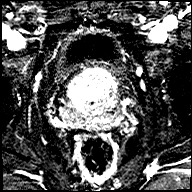
[im 478/696]
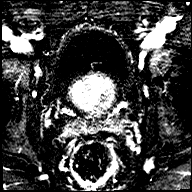
[im 522/696]
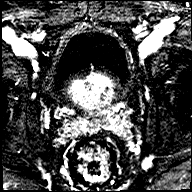
[im 565/696]
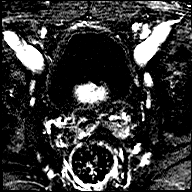
[im 609/696]
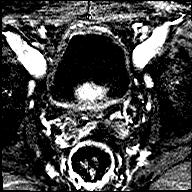
[im 652/696]
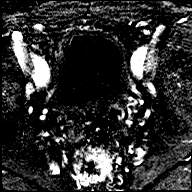
[im 696/696]
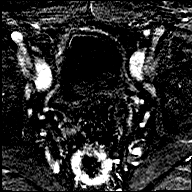

[Series 13: t1_vibe_dixon_tra_f · axial · 2.5mm · 0.91mm/px · z∈[+20,+217]mm · 2 of 80 slices shown]
[im 1/80]
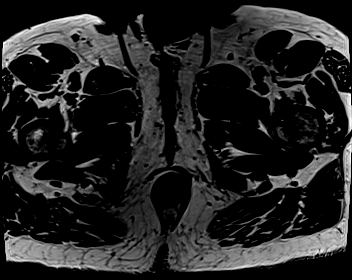
[im 80/80]
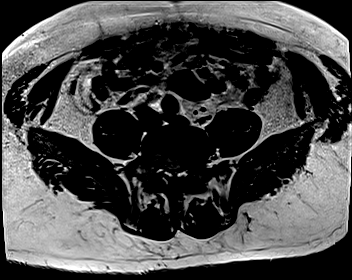

[Series 14: t1_vibe_dixon_tra_w · axial · 2.5mm · 0.91mm/px · z∈[+20,+217]mm · 2 of 80 slices shown]
[im 1/80]
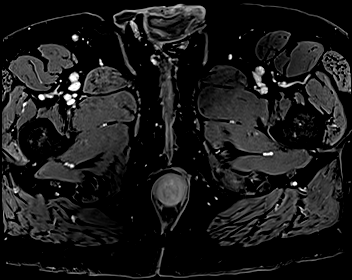
[im 80/80]
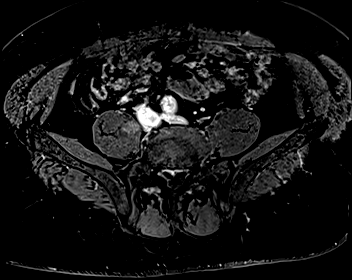

[48 of 48 positions shown; findings below may reference images not displayed]

FINDINGS: Prostate:

-- Peripheral Zone: Linear/wedge shaped hypointensities are noted on
ADC, left side greater than right. A focal nodular area of T2
hypointensity is seen in the posterior mid gland near the midline
which measures 1.4 x 0.9 cm (e.g. Image [DATE]). This nodule shows
moderate ADC hypointensity, but no significant DWI hyperintensity or
early focal contrast enhancement. PI-RADS 3

-- Transition/Central Zone: Moderately enlarged with encapsulated
BPH nodules. No suspicious nodules identified on T2-weighted or
diffusion sequences.

-- Measurements/Volume:  5.9 by 5.2 x 5.8 cm

Transcapsular spread:  Absent

Seminal vesicle involvement:  Absent

Neurovascular bundle involvement:  Absent

Pelvic adenopathy: None visualized

Bone metastasis: None visualized

Other:  None
IMPRESSION: 1.4 cm peripheral zone nodule in the posterior mid gland near the
midline, which is indeterminate. PI-RADS 3 (v2.1): Intermediate
(clinically significant cancer equivocal)

No evidence of extracapsular extension or pelvic metastatic disease.

(I have post-processed this exam in the [employer] application for
potential fusion-guided biopsy.)

## 2023-08-04 ENCOUNTER — Ambulatory Visit: Payer: Medicare Other

## 2023-08-04 VITALS — BP 130/78 | HR 71 | Temp 98.1°F | Wt 229.2 lb

## 2023-08-04 DIAGNOSIS — Z0000 Encounter for general adult medical examination without abnormal findings: Secondary | ICD-10-CM

## 2023-08-04 NOTE — Progress Notes (Signed)
Subjective:   Caleb Horton is a 77 y.o. male who presents for Medicare Annual/Subsequent preventive examination.  Visit Complete: In person  Patient Medicare AWV questionnaire was completed by the patient on 07/31/23; I have confirmed that all information answered by patient is correct and no changes since this date.  Cardiac Risk Factors include: advanced age (>49men, >72 women);male gender;hypertension;dyslipidemia;obesity (BMI >30kg/m2)     Objective:    Today's Vitals   08/04/23 1125  BP: 130/78  Pulse: 71  Temp: 98.1 F (36.7 C)  SpO2: 96%  Weight: 229 lb 3.2 oz (104 kg)   Body mass index is 31.09 kg/m.     08/04/2023   11:29 AM 04/28/2023    1:23 PM 02/14/2023   11:45 AM 07/29/2022   11:53 AM 08/12/2021    1:46 PM 07/16/2021   12:00 PM 07/10/2020   11:56 AM  Advanced Directives  Does Patient Have a Medical Advance Directive? Yes No No Yes No Yes Yes  Type of Estate agent of [place];Living will   Healthcare Power of [place];Living will  Healthcare Power of [employer] of [place];Living will  Copy of Healthcare Power of Attorney in Chart? No - copy requested   No - copy requested   No - copy requested  Would patient like information on creating a medical advance directive?   No - Patient declined  No - Patient declined      Current Medications (verified) Outpatient Encounter Medications as of 08/04/2023  Medication Sig   atorvastatin (LIPITOR) 10 MG tablet TAKE 1 TABLET BY MOUTH DAILY   Coenzyme Q10 (CO Q 10 PO) Take 400 mg by mouth daily.   ferrous sulfate 325 (65 FE) MG tablet Take 325 mg by mouth daily with breakfast.   hydrochlorothiazide (HYDRODIURIL) 12.5 MG tablet Take 12.5 mg by mouth daily.   Multiple Vitamin (MULTIVITAMIN) capsule Take 1 capsule by mouth daily. Centrum silver   Omega-3 Fatty Acids (FISH OIL) 1000 MG CAPS Take 1,000 mg by mouth daily.   triamcinolone ointment (KENALOG) 0.5 % Apply 1 Application  topically 2 (two) times daily. Up to 7 days as needed for skin irritation such as poison ivy   valsartan (DIOVAN) 80 MG tablet TAKE 2 TABLETS BY MOUTH DAILY   pantoprazole (PROTONIX) 40 MG tablet Take 1 tablet (40 mg total) by mouth 2 (two) times daily before a meal. (Patient not taking: Reported on 08/04/2023)   sucralfate (CARAFATE) 1 g tablet Take 1 tablet (1 g total) by mouth 2 (two) times daily. (Patient not taking: Reported on 08/04/2023)   [DISCONTINUED] Bismuth/Metronidaz/Tetracyclin (PYLERA) 140-125-125 MG CAPS Take 3 capsules by mouth 4 (four) times daily.   No facility-administered encounter medications on file as of 08/04/2023.    Allergies (verified) Patient has no known allergies.   History: Past Medical History:  Diagnosis Date   Anemia    Hypertension    INGROWN NAIL 11/10/2010   Annotation: bilateral  Qualifier: Diagnosis of  By: Lovell Sheehan MD, John E    Peripheral neuropathy    Sickle cell trait [facility])    Past Surgical History:  Procedure Laterality Date   CIRCUMCISION     age 38   COLONOSCOPY     ENDOSCOPIC MUCOSAL RESECTION  02/14/2023   Procedure: ENDOSCOPIC MUCOSAL RESECTION;  Surgeon: Lemar Lofty., MD;  Location: [facility];  Service: Gastroenterology;;   ESOPHAGOGASTRODUODENOSCOPY N/A 02/14/2023   Procedure: ESOPHAGOGASTRODUODENOSCOPY (EGD);  Surgeon: Meridee Score Netty Starring., MD;  Location: [facility];  Service: Gastroenterology;  Laterality: N/A;   EUS N/A 02/14/2023   Procedure: UPPER ENDOSCOPIC ULTRASOUND (EUS) RADIAL;  Surgeon: Lemar Lofty., MD;  Location: [facility];  Service: Gastroenterology;  Laterality: N/A;   HEMOSTASIS CLIP PLACEMENT  02/14/2023   Procedure: HEMOSTASIS CLIP PLACEMENT;  Surgeon: Meridee Score Netty Starring., MD;  Location: [facility];  Service: Gastroenterology;;   SUBMUCOSAL LIFTING INJECTION  02/14/2023   Procedure: SUBMUCOSAL LIFTING INJECTION;  Surgeon: Lemar Lofty., MD;  Location: [facility];  Service:  Gastroenterology;;   SUBMUCOSAL TATTOO INJECTION  02/14/2023   Procedure: SUBMUCOSAL TATTOO INJECTION;  Surgeon: Lemar Lofty., MD;  Location: [facility];  Service: Gastroenterology;;   Family History  Problem Relation Age of Onset   Hypertension Father    Heart disease Father    Hearing loss Mother    Heart disease Daughter    Obesity Daughter    Obesity Son    Diabetes Maternal Grandmother    Colon cancer Neg Hx    Esophageal cancer Neg Hx    Stomach cancer Neg Hx    Rectal cancer Neg Hx    Social History   Socioeconomic History   Marital status: Married    Spouse name: Ruby   Number of children: Not on file   Years of education: Not on file   Highest education level: Some college, no degree  Occupational History    Employer: RETIRED  Tobacco Use   Smoking status: Former    Current packs/day: 0.00    Average packs/day: 0.5 packs/day for 3.0 years (1.5 ttl pk-yrs)    Types: Cigarettes    Start date: 10/11/1965    Quit date: 10/11/1968    Years since quitting: 54.8   Smokeless tobacco: Never  Vaping Use   Vaping status: Never Used  Substance and Sexual Activity   Alcohol use: Yes    Alcohol/week: 5.0 standard drinks of alcohol    Types: 1 Shots of liquor, 4 Standard drinks or equivalent per week    Comment: once a week    Drug use: No   Sexual activity: Not on file  Other Topics Concern   Not on file  Social History Narrative   Married (3rd marriage). 5 children (3 from 1st, 2 from second), 7 grandkids.       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 He lives with his wife in a 2 story house. He drinks one cup of coffee QOD, and occasionally drinks tea.   Social Determinants of Health   Financial Resource Strain: Low Risk  (07/31/2023)   Overall Financial Resource Strain (CARDIA)    Difficulty of Paying Living Expenses: Not hard at all  Food Insecurity: No Food Insecurity  (07/31/2023)   Hunger Vital Sign    Worried About Running Out of Food in the Last Year: Never true    Ran Out of Food in the Last Year: Never true  Transportation Needs: No Transportation Needs (07/31/2023)   PRAPARE - Administrator, Civil Service (Medical): No    Lack of Transportation (Non-Medical): No  Physical Activity: Sufficiently Active (07/31/2023)   Exercise Vital Sign    Days of Exercise per Week: 5 days    Minutes of Exercise per Session: 50 min  Stress: No Stress Concern Present (07/31/2023)   [employer] of Occupational Health - Occupational Stress Questionnaire    Feeling of Stress :  Not at all  Social Connections: Moderately Integrated (07/31/2023)   Social Connection and Isolation Panel [NHANES]    Frequency of Communication with Friends and Family: Three times a week    Frequency of Social Gatherings with Friends and Family: Once a week    Attends Religious Services: Never    Database administrator or Organizations: Yes    Attends Engineer, structural: More than 4 times per year    Marital Status: Married    Tobacco Counseling Counseling given: Not Answered   Clinical Intake:  Pre-visit preparation completed: Yes  Pain : No/denies pain     BMI - recorded: 31.09 Nutritional Status: BMI > 30  Obese Nutritional Risks: None Diabetes: No  How often do you need to have someone help you when you read instructions, pamphlets, or other written materials from your doctor or pharmacy?: 1 - Never  Interpreter Needed?: No  Information entered by :: Lanier Ensign, LPN   Activities of Daily Living    07/31/2023    8:51 AM  In your present state of health, do you have any difficulty performing the following activities:  Hearing? 1  Comment has hearing aids he's trying out  Vision? 0  Difficulty concentrating or making decisions? 0  Walking or climbing stairs? 0  Dressing or bathing? 0  Doing errands, shopping? 0  Preparing Food  and eating ? N  Using the Toilet? N  In the past six months, have you accidently leaked urine? Y  Comment wears a brief  Do you have problems with loss of bowel control? N  Managing your Medications? N  Managing your Finances? N  Housekeeping or managing your Housekeeping? N    Patient Care Team: Shelva Majestic, MD as PCP - General (Family Medicine) Jodelle Red, MD as PCP - Cardiology (Cardiology) Jodelle Red, MD as Consulting Physician (Cardiology) Erroll Luna, [facility] (Inactive) (Pharmacist) Malachy Mood, MD as Attending Physician (Hematology and Oncology) Pollyann Samples, NP as Nurse Practitioner (Hematology and Oncology)  Indicate any recent Medical Services you may have received from other than [facility] providers in the past year (date may be approximate).     Assessment:   This is a routine wellness examination for [place].  Hearing/Vision screen Hearing Screening - Comments:: Pt trying hearing aids  Vision Screening - Comments:: Pt follows with provider for annual eye exams    Goals Addressed             This Visit's Progress    Patient Stated       Get back in shape        Depression Screen    08/04/2023   11:30 AM 12/06/2022   12:54 PM 07/29/2022   11:51 AM 07/16/2021   11:59 AM 11/13/2020    9:48 AM 07/10/2020   11:54 AM 08/29/2019    9:55 AM  PHQ 2/9 Scores  PHQ - 2 Score 0 0 0 0 0 0 0  PHQ- 9 Score  0     0    Fall Risk    07/31/2023    8:51 AM 12/06/2022   12:54 PM 11/02/2022    3:10 PM 07/29/2022   11:54 AM 07/16/2021   12:01 PM  Fall Risk   Falls in the past year? 0 0 0 0 0  Number falls in past yr: 0 0 0 0 0  Injury with Fall? 0 0 0 0 0  Risk for fall due to :  No Fall  Risks No Fall Risks No Fall Risks;Impaired vision Impaired vision  Follow up Falls prevention discussed Falls evaluation completed Falls evaluation completed Falls prevention discussed Falls prevention discussed    MEDICARE RISK AT HOME: Medicare Risk at  Home Any stairs in or around the home?: Yes If so, are there any without handrails?: Yes Home free of loose throw rugs in walkways, pet beds, electrical cords, etc?: Yes Adequate lighting in your home to reduce risk of falls?: Yes Life alert?: No Use of a cane, walker or w/c?: No Grab bars in the bathroom?: Yes Shower chair or bench in shower?: No Elevated toilet seat or a handicapped toilet?: No  TIMED UP AND GO:  Was the test performed?  Yes  Length of time to ambulate 10 feet: 10 sec Gait steady and fast without use of assistive device    Cognitive Function:    02/02/2017    9:47 AM  MMSE - Mini Mental State Exam  Not completed: --        08/04/2023   11:32 AM 07/29/2022   11:54 AM 07/16/2021   12:02 PM 07/10/2020   12:01 PM 06/14/2019    3:29 PM  6CIT Screen  What Year? 0 points 0 points 0 points 0 points 0 points  What month? 0 points 0 points 0 points 0 points 0 points  What time? 0 points 0 points 0 points  0 points  Count back from 20 0 points 0 points 0 points 0 points 0 points  Months in reverse 0 points 0 points 0 points 0 points 0 points  Repeat phrase 0 points 0 points 0 points 0 points 2 points  Total Score 0 points 0 points 0 points  2 points    Immunizations Immunization History  Administered Date(s) Administered   Fluad Quad(high Dose 65+) 06/14/2019, 09/15/2020, 09/24/2022   Influenza Split 08/15/2012   Influenza, High Dose Seasonal PF 07/14/2013, 07/05/2014   Influenza-Unspecified 07/20/2015, 07/26/2018   PFIZER Comirnaty(Gray Top)Covid-19 Tri-Sucrose Vaccine 02/09/2021   PFIZER(Purple Top)SARS-COV-2 Vaccination 12/04/2019, 12/25/2019, 07/15/2020   Pfizer Covid-19 Vaccine Bivalent Booster 15yrs & up 08/12/2021, 02/23/2022   Pfizer(Comirnaty)Fall Seasonal Vaccine 12 years and older 09/24/2022   Pneumococcal Conjugate-13 07/05/2014   Pneumococcal Polysaccharide-23 02/12/2013   Respiratory Syncytial Virus Vaccine,Recomb Aduvanted(Arexvy) 07/09/2022    Td 10/11/2005   Tdap 03/17/2018   Zoster Recombinant(Shingrix) 12/09/2021, 02/17/2022   Zoster, Live 02/04/2015    TDAP status: Up to date  Flu Vaccine status: Due, Education has been provided regarding the importance of this vaccine. Advised may receive this vaccine at local pharmacy or Health Dept. Aware to provide a copy of the vaccination record if obtained from local pharmacy or Health Dept. Verbalized acceptance and understanding.  Pneumococcal vaccine status: Up to date  Covid-19 vaccine status: Information provided on how to obtain vaccines.   Qualifies for Shingles Vaccine? Yes   Zostavax completed Yes   Shingrix Completed?: Yes  Screening Tests Health Maintenance  Topic Date Due   COVID-19 Vaccine (8 - 2023-24 season) 06/12/2023   INFLUENZA VACCINE  01/09/2024 (Originally 05/12/2023)   Hepatitis C Screening  10/08/2098 (Originally 06/19/1964)   Medicare Annual Wellness (AWV)  08/03/2024   Colonoscopy  10/29/2025   DTaP/Tdap/Td (3 - Td or Tdap) 03/17/2028   Pneumonia Vaccine 11+ Years old  Completed   Zoster Vaccines- Shingrix  Completed   HPV VACCINES  Aged Out    Health Maintenance  Health Maintenance Due  Topic Date Due   COVID-19 Vaccine (8 -  2023-24 season) 06/12/2023    Colorectal cancer screening: Type of screening: Colonoscopy. Completed 10/29/20. Repeat every 5 years  Additional Screening:  Hepatitis C Screening: does qualify;  Vision Screening: Recommended annual ophthalmology exams for early detection of glaucoma and other disorders of the eye. Is the patient up to date with their annual eye exam?  Yes  Who is the provider or what is the name of the office in which the patient attends annual eye exams? Unsure of providers name  If pt is not established with a provider, would they like to be referred to a provider to establish care? No .   Dental Screening: Recommended annual dental exams for proper oral hygiene  Community Resource Referral / Chronic  Care Management: CRR required this visit?  No   CCM required this visit?  No     Plan:     I have personally reviewed and noted the following in the patient's chart:   Medical and social history Use of alcohol, tobacco or illicit drugs  Current medications and supplements including opioid prescriptions. Patient is not currently taking opioid prescriptions. Functional ability and status Nutritional status Physical activity Advanced directives List of other physicians Hospitalizations, surgeries, and ER visits in previous 12 months Vitals Screenings to include cognitive, depression, and falls Referrals and appointments  In addition, I have reviewed and discussed with patient certain preventive protocols, quality metrics, and best practice recommendations. A written personalized care plan for preventive services as well as general preventive health recommendations were provided to patient.     Marzella Schlein, LPN   [id number]   After Visit Summary: (MyChart) Due to this being a telephonic visit, the after visit summary with patients personalized plan was offered to patient via MyChart   Nurse Notes: none

## 2023-08-04 NOTE — Patient Instructions (Signed)
Caleb Horton , Thank you for taking time to come for your Medicare Wellness Visit. I appreciate your ongoing commitment to your health goals. Please review the following plan we discussed and let me know if I can assist you in the future.   Referrals/Orders/Follow-Ups/Clinician Recommendations: Aim for 30 minutes of exercise or brisk walking, 6-8 glasses of water, and 5 servings of fruits and vegetables each day. Continue to lose weight and stay active   This is a list of the screening recommended for you and due dates:  Health Maintenance  Topic Date Due   Flu Shot  05/12/2023   COVID-19 Vaccine (8 - 2023-24 season) 06/12/2023   Hepatitis C Screening  10/08/2098*   Medicare Annual Wellness Visit  08/03/2024   Colon Cancer Screening  10/29/2025   DTaP/Tdap/Td vaccine (3 - Td or Tdap) 03/17/2028   Pneumonia Vaccine  Completed   Zoster (Shingles) Vaccine  Completed   HPV Vaccine  Aged Out  *Topic was postponed. The date shown is not the original due date.    Advanced directives: (Copy Requested) Please bring a copy of your health care power of attorney and living will to the office to be added to your chart at your convenience.  Next Medicare Annual Wellness Visit scheduled for next year: Yes

## 2023-11-16 ENCOUNTER — Ambulatory Visit (INDEPENDENT_AMBULATORY_CARE_PROVIDER_SITE_OTHER): Payer: Medicare (Managed Care) | Admitting: Family Medicine

## 2023-11-16 ENCOUNTER — Encounter: Payer: Self-pay | Admitting: Family Medicine

## 2023-11-16 VITALS — BP 132/76 | HR 71 | Temp 97.4°F | Ht 72.0 in | Wt 232.4 lb

## 2023-11-16 DIAGNOSIS — Z0000 Encounter for general adult medical examination without abnormal findings: Secondary | ICD-10-CM | POA: Diagnosis not present

## 2023-11-16 DIAGNOSIS — E785 Hyperlipidemia, unspecified: Secondary | ICD-10-CM

## 2023-11-16 DIAGNOSIS — C61 Malignant neoplasm of prostate: Secondary | ICD-10-CM | POA: Diagnosis not present

## 2023-11-16 DIAGNOSIS — D573 Sickle-cell trait: Secondary | ICD-10-CM | POA: Diagnosis not present

## 2023-11-16 DIAGNOSIS — R739 Hyperglycemia, unspecified: Secondary | ICD-10-CM

## 2023-11-16 DIAGNOSIS — I10 Essential (primary) hypertension: Secondary | ICD-10-CM

## 2023-11-16 DIAGNOSIS — Z131 Encounter for screening for diabetes mellitus: Secondary | ICD-10-CM | POA: Diagnosis not present

## 2023-11-16 DIAGNOSIS — N183 Chronic kidney disease, stage 3 unspecified: Secondary | ICD-10-CM

## 2023-11-16 DIAGNOSIS — I714 Abdominal aortic aneurysm, without rupture, unspecified: Secondary | ICD-10-CM

## 2023-11-16 DIAGNOSIS — Z23 Encounter for immunization: Secondary | ICD-10-CM | POA: Diagnosis not present

## 2023-11-16 LAB — CBC WITH DIFFERENTIAL/PLATELET
Basophils Absolute: 0 10*3/uL (ref 0.0–0.1)
Basophils Relative: 0.8 % (ref 0.0–3.0)
Eosinophils Absolute: 0.1 10*3/uL (ref 0.0–0.7)
Eosinophils Relative: 2.5 % (ref 0.0–5.0)
HCT: 36.4 % — ABNORMAL LOW (ref 39.0–52.0)
Hemoglobin: 11.8 g/dL — ABNORMAL LOW (ref 13.0–17.0)
Lymphocytes Relative: 37.8 % (ref 12.0–46.0)
Lymphs Abs: 1.7 10*3/uL (ref 0.7–4.0)
MCHC: 32.5 g/dL (ref 30.0–36.0)
MCV: 88.8 fL (ref 78.0–100.0)
Monocytes Absolute: 0.6 10*3/uL (ref 0.1–1.0)
Monocytes Relative: 13.7 % — ABNORMAL HIGH (ref 3.0–12.0)
Neutro Abs: 2 10*3/uL (ref 1.4–7.7)
Neutrophils Relative %: 45.2 % (ref 43.0–77.0)
Platelets: 139 10*3/uL — ABNORMAL LOW (ref 150.0–400.0)
RBC: 4.1 Mil/uL — ABNORMAL LOW (ref 4.22–5.81)
RDW: 15.4 % (ref 11.5–15.5)
WBC: 4.5 10*3/uL (ref 4.0–10.5)

## 2023-11-16 LAB — LIPID PANEL
Cholesterol: 142 mg/dL (ref 0–200)
HDL: 53 mg/dL (ref >39.00)
LDL Cholesterol: 78 mg/dL (ref 0–99)
NonHDL: 88.92
Total CHOL/HDL Ratio: 3
Triglycerides: 55 mg/dL (ref 0.0–149.0)
VLDL: 11 mg/dL (ref 0.0–40.0)

## 2023-11-16 LAB — COMPREHENSIVE METABOLIC PANEL
ALT: 18 U/L (ref 0–53)
AST: 22 U/L (ref 0–37)
Albumin: 4 g/dL (ref 3.5–5.2)
Alkaline Phosphatase: 72 U/L (ref 39–117)
BUN: 17 mg/dL (ref 6–23)
CO2: 24 meq/L (ref 19–32)
Calcium: 10.3 mg/dL (ref 8.4–10.5)
Chloride: 107 meq/L (ref 96–112)
Creatinine, Ser: 1.2 mg/dL (ref 0.40–1.50)
GFR: 58.78 mL/min — ABNORMAL LOW (ref >60.00)
Glucose, Bld: 94 mg/dL (ref 70–99)
Potassium: 3.8 meq/L (ref 3.5–5.1)
Sodium: 138 meq/L (ref 135–145)
Total Bilirubin: 0.4 mg/dL (ref 0.2–1.2)
Total Protein: 8.8 g/dL — ABNORMAL HIGH (ref 6.0–8.3)

## 2023-11-16 LAB — HEMOGLOBIN A1C: Hgb A1c MFr Bld: 4.4 % — ABNORMAL LOW (ref 4.6–6.5)

## 2023-11-16 MED ORDER — TRIAMCINOLONE ACETONIDE 0.5 % EX OINT: 1.0000 | TOPICAL_OINTMENT | Freq: Two times a day (BID) | CUTANEOUS | 1 refills | Status: AC

## 2023-11-16 MED ORDER — VALSARTAN 160 MG PO TABS: 160.0000 mg | ORAL_TABLET | Freq: Every day | ORAL | 3 refills | Status: DC

## 2023-11-16 NOTE — Patient Instructions (Addendum)
 Flu shot today  Please stop by lab before you go If you have mychart- we will send your results within 3 business days of us  receiving them.  If you do not have mychart- we will call you about results within 5 business days of us  receiving them.  *please also note that you will see labs on mychart as soon as they post. I will later go in and write notes on them- will say notes from Dr. Katrinka   Recommended follow up: Return in about 6 months (around 05/15/2024) for followup or sooner if needed.Schedule b4 you leave.

## 2023-11-16 NOTE — Addendum Note (Signed)
 Addended by: Almira Jaeger on: 11/16/2023 11:45 AM   Modules accepted: Level of Service

## 2023-11-16 NOTE — Progress Notes (Signed)
 Phone: [phone]   Subjective:  Patient presents today for their annual physical. Chief complaint-noted.   See problem oriented charting- ROS- full  review of systems was completed and negative  Per full ROS sheet completed by patient  The following were reviewed and entered/updated in epic: Past Medical History:  Diagnosis Date   Anemia Sickle cell trait   None   Hypertension 03-30-89   None   INGROWN NAIL 11/10/2010   Annotation: bilateral  Qualifier: Diagnosis of  By: Mavis MD, John E    Peripheral neuropathy    Sickle cell trait [facility])    Patient Active Problem List   Diagnosis Date Noted   Prostate cancer (HCC) 08/31/2017    Priority: High   Gammopathy 03/20/2015    Priority: High   AAA (abdominal aortic aneurysm) (HCC) 03/17/2017    Priority: Medium    Idiopathic neuropathy 02/07/2017    Priority: Medium    Hyperlipidemia 09/04/2014    Priority: Medium    CKD (chronic kidney disease), 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    Priority: Medium    Essential hypertension 11/27/2007    Priority: Medium    History of adenomatous polyp of colon 08/05/2015    Priority: Low   Elevated blood protein 02/04/2015    Priority: Low   Hyperglycemia 11/27/2007    Priority: Low   Sickle-cell trait (HCC) 11/27/2007    Priority: Low   BPH (benign prostatic hyperplasia) 11/27/2007    Priority: Low   Stage 3 chronic kidney disease, unspecified whether stage 3a or 3b CKD (HCC) 11/02/2022   Thrombocytopenia (HCC) 09/19/2015   Past Surgical History:  Procedure Laterality Date   CIRCUMCISION     age 34   COLONOSCOPY     ENDOSCOPIC MUCOSAL RESECTION  02/14/2023   Procedure: ENDOSCOPIC MUCOSAL RESECTION;  Surgeon: Wilhelmenia Aloha Raddle., MD;  Location: [facility];  Service: Gastroenterology;;   ESOPHAGOGASTRODUODENOSCOPY N/A 02/14/2023   Procedure: ESOPHAGOGASTRODUODENOSCOPY (EGD);  Surgeon: Wilhelmenia Aloha Raddle., MD;  Location: [facility];  Service: Gastroenterology;  Laterality: N/A;    EUS N/A 02/14/2023   Procedure: UPPER ENDOSCOPIC ULTRASOUND (EUS) RADIAL;  Surgeon: Wilhelmenia Aloha Raddle., MD;  Location: [facility];  Service: Gastroenterology;  Laterality: N/A;   HEMOSTASIS CLIP PLACEMENT  02/14/2023   Procedure: HEMOSTASIS CLIP PLACEMENT;  Surgeon: Wilhelmenia Aloha Raddle., MD;  Location: [facility];  Service: Gastroenterology;;   SUBMUCOSAL LIFTING INJECTION  02/14/2023   Procedure: SUBMUCOSAL LIFTING INJECTION;  Surgeon: Wilhelmenia Aloha Raddle., MD;  Location: [facility];  Service: Gastroenterology;;   SUBMUCOSAL TATTOO INJECTION  02/14/2023   Procedure: SUBMUCOSAL TATTOO INJECTION;  Surgeon: Wilhelmenia Aloha Raddle., MD;  Location: [facility];  Service: Gastroenterology;;    Family History  Problem Relation Age of Onset   Hypertension Father    Heart disease Father    Hearing loss Mother    Heart disease Daughter    Obesity Daughter    Obesity Son    Diabetes Maternal Grandmother    Colon cancer Neg Hx    Esophageal cancer Neg Hx    Stomach cancer Neg Hx    Rectal cancer Neg Hx     Medications- reviewed and updated Current Outpatient Medications  Medication Sig Dispense Refill   atorvastatin  (LIPITOR) 10 MG tablet TAKE 1 TABLET BY MOUTH DAILY 90 tablet 3   Coenzyme Q10 (CO Q 10 PO) Take 400 mg by mouth daily.     ferrous sulfate 325 (65 FE) MG tablet Take 325 mg by mouth daily with breakfast.  hydrochlorothiazide  (HYDRODIURIL ) 12.5 MG tablet Take 12.5 mg by mouth daily.     Multiple Vitamin (MULTIVITAMIN) capsule Take 1 capsule by mouth daily. Centrum silver     Omega-3 Fatty Acids  (FISH OIL) 1000 MG CAPS Take 1,000 mg by mouth daily.     pantoprazole  (PROTONIX ) 40 MG tablet Take 1 tablet (40 mg total) by mouth 2 (two) times daily before a meal. 60 tablet 6   sucralfate  (CARAFATE ) 1 g tablet Take 1 tablet (1 g total) by mouth 2 (two) times daily. 60 tablet 0   triamcinolone  ointment (KENALOG ) 0.5 % Apply 1 Application topically 2 (two) times daily. Up to  7 days as needed for skin irritation such as poison ivy 60 g 1   valsartan  (DIOVAN ) 160 MG tablet Take 1 tablet (160 mg total) by mouth daily. 90 tablet 3   No current facility-administered medications for this visit.    Allergies-reviewed and updated No Known Allergies  Social History   Social History Narrative   Married (3rd marriage). 5 children (3 from 1st, 2 from second), 7 grandkids.       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 He lives with his wife in a 2 story house. He drinks one cup of coffee QOD, and occasionally drinks tea.   Objective  Objective:  BP 132/76   Pulse 71   Temp (!) 97.4 F (36.3 C)   Ht 6' (1.829 m)   Wt 232 lb 6.4 oz (105.4 kg)   SpO2 98%   BMI 31.52 kg/m  Gen: NAD, resting comfortably HEENT: Mucous membranes are moist. Oropharynx normal Neck: no thyromegaly CV: RRR no murmurs rubs or gallops Lungs: CTAB no crackles, wheeze, rhonchi Abdomen: soft/nontender/nondistended/normal bowel sounds. No rebound or guarding.  Ext: no edema Skin: warm, dry Neuro: grossly normal, moves all extremities, PERRLA Declines rectal and genitourinary exam    Assessment and Plan  78 y.o. male presenting for annual physical.  Health Maintenance counseling: 1. Anticipatory guidance: Patient counseled regarding regular dental exams -q6 months, eye exams -yearly,  avoiding smoking and second hand smoke , limiting alcohol to 2 beverages per day - 4 shots total in a week, no illicit drugs .   2. Risk factor reduction:  Advised patient of need for regular exercise and diet rich and fruits and vegetables to reduce risk of heart attack and stroke.  Exercise- [employer] at [facility] [employer] back to 5 days a week- short break with cold weather.  Diet/weight management-stable from last physical- mild weight loss may be helpful- he has done a good job though- down  from 259 in may 2020.  Wt Readings from Last 3 Encounters:  11/16/23 232 lb 6.4 oz (105.4 kg)  08/04/23 229 lb 3.2 oz (104 kg)  05/05/23 222 lb 6.4 oz (100.9 kg)  3. Immunizations/screenings/ancillary studies- considering COVID at pharmacy, flu shot today,  Immunization History  Administered Date(s) Administered   Fluad Quad(high Dose 65+) 06/14/2019, 09/15/2020, 09/24/2022   Influenza Split 08/15/2012   Influenza, High Dose Seasonal PF 07/14/2013, 07/05/2014   Influenza-Unspecified 07/20/2015, 07/26/2018   PFIZER Comirnaty (Gray Top)Covid-19 Tri-Sucrose Vaccine 02/09/2021   PFIZER(Purple Top)SARS-COV-2 Vaccination 12/04/2019, 12/25/2019, 07/15/2020   Pfizer Covid-19 Vaccine Bivalent Booster 28yrs & up 08/12/2021, 02/23/2022   Pfizer(Comirnaty )Fall Seasonal Vaccine 12 years and older 09/24/2022   Pneumococcal Conjugate-13 07/05/2014   Pneumococcal Polysaccharide-23 02/12/2013   Respiratory  Syncytial Virus Vaccine,Recomb Aduvanted(Arexvy ) 07/09/2022   Td 10/11/2005   Tdap 03/17/2018   Zoster Recombinant(Shingrix) 12/09/2021, 02/17/2022   Zoster, Live 02/04/2015  4. Prostate cancer screening- under active surveillance with urology- they check his PSA's. Sees them in next month or so   5. Colon cancer screening - history of adenomatous colon polyps October 29 2020 with 5 year repat  6. Skin cancer screening- lower risk due to melanin content. advised regular sunscreen use. Denies worrisome, changing, or new skin lesions.  7. Smoking associated screening (lung cancer screening, AAA screen 65-75, UA)- former smoker- no regular screening other than AAA discussion as below 8. STD screening - only active with [facility] so not needed  Status of chronic or acute concerns   #prostate cancer- under active surveillance as above- reports stable PSA's- most recent prostate MRI 2023  #gammopathy/thrombocytopenia - released from hematology in 2017. We continue to monitor CBC with diff. Also with  thrombocytopenia monitor with CBC diff today  #hyperlipidemia S: Medication: Atorvastatin  10mg   Lab Results  Component Value Date   CHOL 131 07/01/2022   HDL 46.60 07/01/2022   LDLCALC 70 07/01/2022   LDLDIRECT 81.0 11/13/2020   TRIG 74.0 07/01/2022   CHOLHDL 3 07/01/2022  A/P: #s at ideal goal last time- update with labs today- continue current medications for now   #hypertension S: medication: valsartan  160Mg , hydrochlorothiazide  12.5 mg daily BP Readings from Last 3 Encounters:  11/16/23 132/76  08/04/23 130/78  05/05/23 116/70  A/P: well controlled continue current medications   # Hyperglycemia/insulin resistance/prediabetes- peak a1c 6.6 x1 with at home test, otherwise lower than that S:  Medication:  none Exercise and diet-  works at [employer] Value Date   HGBA1C 5.7 11/02/2022   HGBA1C 5.9 07/01/2022   HGBA1C 5.8 11/13/2020   A/P: hopefully stable- update a1c today. Continue without meds for now   #anemia- issues in 2024 requirng updated endoscopy- thought of the be irritated mucosa/h pylori related -On long-term pantoprazole  after this with inflamed mucosa previously biopsied.  Was also treated for H. 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 Future Appointments  Date Time Provider Department Center  12/05/2023 10:30 AM [facility] LAB [facility] None  12/05/2023 11:00 AM Lanny Callander, MD [facility] None  08/09/2024 11:00 AM [facility] ANNUAL WELLNESS VISIT 1 [facility]EC   Lab/Order associations: fasting   ICD-10-CM   1. Preventative health care  Z00.00     2.  Hyperlipidemia, unspecified hyperlipidemia type  E78.5 Comprehensive metabolic panel    CBC with Differential/Platelet    Lipid panel    3. Prostate cancer (HCC)  C61     4. Sickle-cell trait (HCC)  D57.3     5. Abdominal aortic aneurysm (AAA) without rupture, unspecified part (HCC)  I71.40     6. Essential hypertension  I10     7. Hyperglycemia  R73.9 Hemoglobin A1c    8. Screening for diabetes mellitus  Z13.1 Hemoglobin A1c    9. Stage 3 chronic kidney disease, unspecified whether stage 3a or 3b CKD (HCC) Chronic N18.30       Meds ordered  this encounter  Medications   triamcinolone  ointment (KENALOG ) 0.5 %    Sig: Apply 1 Application topically 2 (two) times daily. Up to 7 days as needed for skin irritation such as poison ivy    Dispense:  60 g    Refill:  1   valsartan  (DIOVAN ) 160 MG tablet    Sig: Take 1 tablet (160 mg total) by mouth daily.    Dispense:  90 tablet    Refill:  3    Please send a replace/new response with 90-Day Supply if appropriate to maximize member benefit. Requesting 1 year supply.    Return precautions advised.  Garnette Lukes, MD

## 2023-11-16 NOTE — Addendum Note (Signed)
 Addended by: Arva Lathe on: 11/16/2023 11:59 AM   Modules accepted: Orders

## 2023-11-17 ENCOUNTER — Encounter: Payer: Self-pay | Admitting: Family Medicine

## 2023-11-28 ENCOUNTER — Other Ambulatory Visit (HOSPITAL_BASED_OUTPATIENT_CLINIC_OR_DEPARTMENT_OTHER): Payer: Self-pay

## 2023-11-28 MED ORDER — COMIRNATY 30 MCG/0.3ML IM SUSY
0.3000 mL | PREFILLED_SYRINGE | Freq: Once | INTRAMUSCULAR | 0 refills | Status: AC
  Filled 2023-11-28: qty 0.3, 1d supply, fill #0

## 2023-11-30 ENCOUNTER — Other Ambulatory Visit: Payer: Self-pay | Admitting: Medical Genetics

## 2023-11-30 DIAGNOSIS — C61 Malignant neoplasm of prostate: Secondary | ICD-10-CM | POA: Diagnosis not present

## 2023-12-01 NOTE — Assessment & Plan Note (Signed)
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 Both his kappa and lambda light chain are elevated. His 24-hour urine was also negative for Lillie Fragmin protein. This is more consistent with multiclonal gammopathy. I think MGUS or multiple myeloma is very unlikely. -His repeated SPEP and light chain level again showed no detectable M protein.  -His bone survey was negative

## 2023-12-05 ENCOUNTER — Encounter: Payer: Self-pay | Admitting: Hematology

## 2023-12-05 ENCOUNTER — Inpatient Hospital Stay: Payer: Medicare (Managed Care) | Attending: Hematology

## 2023-12-05 ENCOUNTER — Inpatient Hospital Stay: Payer: Medicare (Managed Care) | Admitting: Hematology

## 2023-12-05 VITALS — BP 153/70 | HR 70 | Temp 97.7°F | Resp 20 | Wt 235.8 lb

## 2023-12-05 DIAGNOSIS — N289 Disorder of kidney and ureter, unspecified: Secondary | ICD-10-CM | POA: Insufficient documentation

## 2023-12-05 DIAGNOSIS — D472 Monoclonal gammopathy: Secondary | ICD-10-CM

## 2023-12-05 DIAGNOSIS — D649 Anemia, unspecified: Secondary | ICD-10-CM

## 2023-12-05 DIAGNOSIS — Z79899 Other long term (current) drug therapy: Secondary | ICD-10-CM | POA: Insufficient documentation

## 2023-12-05 LAB — FERRITIN: Ferritin: 35 ng/mL (ref 24–336)

## 2023-12-05 NOTE — Progress Notes (Signed)
 [facility]   Telephone:([phone]) [phone] Fax:(336) [phone]   Clinic Follow up Note   Patient Care Team: Shelva Majestic, MD as PCP - General (Family Medicine) Jodelle Red, MD as PCP - Cardiology (Cardiology) Jodelle Red, MD as Consulting Physician (Cardiology) Erroll Luna, [facility] (Inactive) (Pharmacist) Malachy Mood, MD as Attending Physician (Hematology and Oncology) Pollyann Samples, NP as Nurse Practitioner (Hematology and Oncology)  Date of Service:  12/05/2023  CHIEF COMPLAINT: f/u of gammopathy  CURRENT THERAPY:  Monitoring  Oncology History   Gammopathy Gammopathy, 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 Both his kappa and lambda light chain are elevated. His 24-hour urine was also negative for Lillie Fragmin protein. This is more consistent with multiclonal gammopathy. I think MGUS or multiple myeloma is very unlikely. -His repeated SPEP and light chain level again showed no detectable M protein.  -His bone survey was negative   Assessment and Plan    Anemia Chronic mild anemia with hemoglobin levels previously at 10-11 g/dL, currently improved to 11.8 g/dL. Likely secondary to chronic kidney disease, which has shown improvement. Previous iron studies and M protein tests were unremarkable. Discussed discontinuing Centrum multivitamin as it may interfere with other treatments. Awaiting current lab results to confirm ongoing improvement.   Acute Kidney Disease Pt had previously impaired kidney function, now improved to normal levels. Electrolytes are within normal limits. Improved kidney function may be contributing to the improvement in anemia. Awaiting current lab results to ensure continued stability. - Monitor kidney function - Review current lab results  General Health Maintenance Overall health is  well-managed with no new complaints. Recent lab results, including A1c and cholesterol levels, are within normal limits. - Continue follow-up with primary care physician - Maintain current medication regimen  Follow-up Plan -I reviewed his recent lab work from his PCPs office on November 16, 2023 -I repeated SPEP and serum light chain level today, results are still pending, will call him when results return.  If unremarkable, I will see him as needed in future.      Discussed the use of AI scribe software for clinical note transcription with the patient, who gave verbal consent to proceed.  History of Present Illness   A 78 year old patient presents for a follow-up visit after a year. The patient reports having a urinary tract infection (UTI) last year around July or August. The patient's medication list has been reviewed and is accurate. The patient has a history of mild anemia and abnormal protein in the blood. The patient's recent blood count and kidney liver function tests were normal. The patient's kidney function has improved, and the anemia has improved. The patient was taking Centrum, a multivitamin, but has stopped.         All other systems were reviewed with the patient and are negative.  MEDICAL HISTORY:  Past Medical History:  Diagnosis Date   Anemia Sickle cell trait   None   Hypertension 03-30-89   None   INGROWN NAIL 11/10/2010   Annotation: bilateral  Qualifier: Diagnosis of  By: Lovell Sheehan MD, John E    Peripheral neuropathy    Sickle cell trait [facility])     SURGICAL HISTORY: Past Surgical History:  Procedure Laterality Date   CIRCUMCISION     age 7   COLONOSCOPY     ENDOSCOPIC MUCOSAL RESECTION  02/14/2023   Procedure: ENDOSCOPIC MUCOSAL RESECTION;  Surgeon:  Mansouraty, Netty Starring., MD;  Location: [facility];  Service: Gastroenterology;;   ESOPHAGOGASTRODUODENOSCOPY N/A 02/14/2023   Procedure: ESOPHAGOGASTRODUODENOSCOPY (EGD);  Surgeon: Lemar Lofty.,  MD;  Location: [facility];  Service: Gastroenterology;  Laterality: N/A;   EUS N/A 02/14/2023   Procedure: UPPER ENDOSCOPIC ULTRASOUND (EUS) RADIAL;  Surgeon: Lemar Lofty., MD;  Location: [facility];  Service: Gastroenterology;  Laterality: N/A;   HEMOSTASIS CLIP PLACEMENT  02/14/2023   Procedure: HEMOSTASIS CLIP PLACEMENT;  Surgeon: Lemar Lofty., MD;  Location: [facility];  Service: Gastroenterology;;   SUBMUCOSAL LIFTING INJECTION  02/14/2023   Procedure: SUBMUCOSAL LIFTING INJECTION;  Surgeon: Lemar Lofty., MD;  Location: [facility];  Service: Gastroenterology;;   SUBMUCOSAL TATTOO INJECTION  02/14/2023   Procedure: SUBMUCOSAL TATTOO INJECTION;  Surgeon: Lemar Lofty., MD;  Location: [facility];  Service: Gastroenterology;;    I have reviewed the social history and family history with the patient and they are unchanged from previous note.  ALLERGIES:  has no known allergies.  MEDICATIONS:  Current Outpatient Medications  Medication Sig Dispense Refill   atorvastatin (LIPITOR) 10 MG tablet TAKE 1 TABLET BY MOUTH DAILY 90 tablet 3   Coenzyme Q10 (CO Q 10 PO) Take 400 mg by mouth daily.     ferrous sulfate 325 (65 FE) MG tablet Take 325 mg by mouth daily with breakfast.     hydrochlorothiazide (HYDRODIURIL) 12.5 MG tablet Take 12.5 mg by mouth daily.     Multiple Vitamin (MULTIVITAMIN) capsule Take 1 capsule by mouth daily. Centrum silver     Omega-3 Fatty Acids (FISH OIL) 1000 MG CAPS Take 1,000 mg by mouth daily.     pantoprazole (PROTONIX) 40 MG tablet Take 1 tablet (40 mg total) by mouth 2 (two) times daily before a meal. 60 tablet 6   sucralfate (CARAFATE) 1 g tablet Take 1 tablet (1 g total) by mouth 2 (two) times daily. 60 tablet 0   triamcinolone ointment (KENALOG) 0.5 % Apply 1 Application topically 2 (two) times daily. Up to 7 days as needed for skin irritation such as poison ivy 60 g 1   valsartan (DIOVAN) 160 MG tablet Take 1 tablet  (160 mg total) by mouth daily. 90 tablet 3   No current facility-administered medications for this visit.    PHYSICAL EXAMINATION: ECOG PERFORMANCE STATUS: 0 - Asymptomatic  Vitals:   12/05/23 1046  BP: (!) 153/70  Pulse: 70  Resp: 20  Temp: 97.7 F (36.5 C)  SpO2: 98%   Wt Readings from Last 3 Encounters:  12/05/23 235 lb 12.8 oz (107 kg)  11/16/23 232 lb 6.4 oz (105.4 kg)  08/04/23 229 lb 3.2 oz (104 kg)     GENERAL:alert, no distress and comfortable SKIN: skin color, texture, turgor are normal, no rashes or significant lesions EYES: normal, Conjunctiva are pink and non-injected, sclera clear NECK: supple, thyroid normal size, non-tender, without nodularity LYMPH:  no palpable lymphadenopathy in the cervical, axillary  LUNGS: clear to auscultation and percussion with normal breathing effort HEART: regular rate & rhythm and no murmurs and no lower extremity edema ABDOMEN:abdomen soft, non-tender and normal bowel sounds Musculoskeletal:no cyanosis of digits and no clubbing  NEURO: alert & oriented x 3 with fluent speech, no focal motor/sensory deficits   LABORATORY DATA:  I have reviewed the data as listed    Latest Ref Rng & Units 11/16/2023   11:52 AM 04/28/2023    1:45 PM 12/03/2022   10:50 AM  CBC  WBC 4.0 -  10.5 K/uL 4.5  11.2  6.0   Hemoglobin 13.0 - 17.0 g/dL 60.6  30.1  60.1   Hematocrit 39.0 - 52.0 % 36.4  34.3  32.0   Platelets 150.0 - 400.0 K/uL 139.0  146  197.0         Latest Ref Rng & Units 11/16/2023   11:52 AM 04/28/2023    1:45 PM 11/17/2022    1:17 PM  CMP  Glucose 70 - 99 mg/dL 94  093  235   BUN 6 - 23 mg/dL 17  16  17    Creatinine 0.40 - 1.50 mg/dL 5.73  2.20  2.54   Sodium 135 - 145 mEq/L 138  130  134   Potassium 3.5 - 5.1 mEq/L 3.8  3.4  4.0   Chloride 96 - 112 mEq/L 107  101  106   CO2 19 - 32 mEq/L 24  21  24    Calcium 8.4 - 10.5 mg/dL 27.0  9.5  62.3   Total Protein 6.0 - 8.3 g/dL 8.8  8.7  8.6   Total Bilirubin 0.2 - 1.2 mg/dL 0.4   1.0  0.3   Alkaline Phos 39 - 117 U/L 72  69  77   AST 0 - 37 U/L 22  34  15   ALT 0 - 53 U/L 18  19  11        RADIOGRAPHIC STUDIES: I have personally reviewed the radiological images as listed and agreed with the findings in the report. No results found.    Orders Placed This Encounter  Procedures   SPEP with reflex to IFE    Standing Status:   Future    Number of Occurrences:   1    Expected Date:   12/05/2023    Expiration Date:   11/30/2024   Kappa/lambda light chains    Standing Status:   Future    Number of Occurrences:   1    Expected Date:   12/05/2023    Expiration Date:   11/30/2024   Ferritin    Standing Status:   Future    Number of Occurrences:   1    Expiration Date:   11/30/2024   All questions were answered. The patient knows to call the clinic with any problems, questions or concerns. No barriers to learning was detected. The total time spent in the appointment was 15 minutes.     Malachy Mood, MD 12/05/2023

## 2023-12-06 LAB — PROTEIN ELECTROPHORESIS, SERUM, WITH REFLEX
A/G Ratio: 0.7 (ref 0.7–1.7)
Albumin ELP: 3.5 g/dL (ref 2.9–4.4)
Alpha-1-Globulin: 0.2 g/dL (ref 0.0–0.4)
Alpha-2-Globulin: 0.7 g/dL (ref 0.4–1.0)
Beta Globulin: 0.9 g/dL (ref 0.7–1.3)
Gamma Globulin: 3 g/dL — ABNORMAL HIGH (ref 0.4–1.8)
Globulin, Total: 4.8 g/dL — ABNORMAL HIGH (ref 2.2–3.9)
Total Protein ELP: 8.3 g/dL (ref 6.0–8.5)

## 2023-12-06 LAB — KAPPA/LAMBDA LIGHT CHAINS
Kappa free light chain: 108.4 mg/L — ABNORMAL HIGH (ref 3.3–19.4)
Kappa, lambda light chain ratio: 1.58 (ref 0.26–1.65)
Lambda free light chains: 68.5 mg/L — ABNORMAL HIGH (ref 5.7–26.3)

## 2023-12-19 ENCOUNTER — Telehealth: Payer: Self-pay

## 2023-12-19 NOTE — Telephone Encounter (Signed)
 Reached out to the patient via telephone call, per Dr. Mosetta Putt.  let pt know his MM and light chain level, overall non-specific, no evidence of MGUS or MM, and that Dr. Mosetta Putt will see him as needed in future.Patient verbalized understanding and did not have any further concerns or questions.

## 2024-05-18 ENCOUNTER — Ambulatory Visit: Payer: Self-pay | Admitting: Family Medicine

## 2024-05-18 ENCOUNTER — Encounter: Payer: Self-pay | Admitting: Family Medicine

## 2024-05-18 ENCOUNTER — Ambulatory Visit: Payer: Medicare (Managed Care) | Admitting: Family Medicine

## 2024-05-18 ENCOUNTER — Other Ambulatory Visit (HOSPITAL_BASED_OUTPATIENT_CLINIC_OR_DEPARTMENT_OTHER): Payer: Self-pay

## 2024-05-18 VITALS — BP 136/74 | HR 84 | Temp 97.2°F | Ht 72.0 in | Wt 241.0 lb

## 2024-05-18 DIAGNOSIS — E785 Hyperlipidemia, unspecified: Secondary | ICD-10-CM | POA: Diagnosis not present

## 2024-05-18 DIAGNOSIS — Z131 Encounter for screening for diabetes mellitus: Secondary | ICD-10-CM

## 2024-05-18 DIAGNOSIS — D472 Monoclonal gammopathy: Secondary | ICD-10-CM

## 2024-05-18 DIAGNOSIS — R7303 Prediabetes: Secondary | ICD-10-CM

## 2024-05-18 DIAGNOSIS — I10 Essential (primary) hypertension: Secondary | ICD-10-CM | POA: Diagnosis not present

## 2024-05-18 LAB — CBC WITH DIFFERENTIAL/PLATELET
Basophils Absolute: 0 K/uL (ref 0.0–0.1)
Basophils Relative: 0.7 % (ref 0.0–3.0)
Eosinophils Absolute: 0.1 K/uL (ref 0.0–0.7)
Eosinophils Relative: 2.4 % (ref 0.0–5.0)
HCT: 35.6 % — ABNORMAL LOW (ref 39.0–52.0)
Hemoglobin: 11.6 g/dL — ABNORMAL LOW (ref 13.0–17.0)
Lymphocytes Relative: 31.2 % (ref 12.0–46.0)
Lymphs Abs: 1.7 K/uL (ref 0.7–4.0)
MCHC: 32.7 g/dL (ref 30.0–36.0)
MCV: 80.8 fl (ref 78.0–100.0)
Monocytes Absolute: 0.6 K/uL (ref 0.1–1.0)
Monocytes Relative: 11.6 % (ref 3.0–12.0)
Neutro Abs: 2.9 K/uL (ref 1.4–7.7)
Neutrophils Relative %: 54.1 % (ref 43.0–77.0)
Platelets: 137 K/uL — ABNORMAL LOW (ref 150.0–400.0)
RBC: 4.41 Mil/uL (ref 4.22–5.81)
RDW: 14.7 % (ref 11.5–15.5)
WBC: 5.3 K/uL (ref 4.0–10.5)

## 2024-05-18 LAB — COMPREHENSIVE METABOLIC PANEL WITH GFR
ALT: 20 U/L (ref 0–53)
AST: 21 U/L (ref 0–37)
Albumin: 3.8 g/dL (ref 3.5–5.2)
Alkaline Phosphatase: 80 U/L (ref 39–117)
BUN: 14 mg/dL (ref 6–23)
CO2: 25 meq/L (ref 19–32)
Calcium: 10.1 mg/dL (ref 8.4–10.5)
Chloride: 106 meq/L (ref 96–112)
Creatinine, Ser: 1.15 mg/dL (ref 0.40–1.50)
GFR: 61.65 mL/min (ref >60.00)
Glucose, Bld: 97 mg/dL (ref 70–99)
Potassium: 3.8 meq/L (ref 3.5–5.1)
Sodium: 137 meq/L (ref 135–145)
Total Bilirubin: 0.4 mg/dL (ref 0.2–1.2)
Total Protein: 8.7 g/dL — ABNORMAL HIGH (ref 6.0–8.3)

## 2024-05-18 LAB — LDL CHOLESTEROL, DIRECT: Direct LDL: 130 mg/dL

## 2024-05-18 LAB — HEMOGLOBIN A1C: Hgb A1c MFr Bld: 5.6 % (ref 4.6–6.5)

## 2024-05-18 MED ORDER — ATORVASTATIN CALCIUM 10 MG PO TABS
10.0000 mg | ORAL_TABLET | Freq: Every day | ORAL | 3 refills | Status: AC
  Filled 2024-05-18: qty 90, 90d supply, fill #0
  Filled 2024-07-31: qty 90, 90d supply, fill #1
  Filled 2024-11-07: qty 90, 90d supply, fill #2

## 2024-05-18 NOTE — Patient Instructions (Addendum)
 Please stop by lab before you go If you have mychart- we will send your results within 3 business days of us  receiving them.  If you do not have mychart- we will call you about results within 5 business days of us  receiving them.  *please also note that you will see labs on mychart as soon as they post. I will later go in and write notes on them- will say notes from Dr. Katrinka   Get back on the weight loss journey- up 9 lbs- try to reverse this by next visit  Recommended follow up: Return in about 6 months (around 11/18/2024) for physical or sooner if needed.Schedule b4 you leave.

## 2024-05-18 NOTE — Progress Notes (Signed)
 Phone [phone] In person visit   Subjective:   Caleb OBRYANT Sr. is a 78 y.o. 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    Priority: Medium    Essential hypertension 11/27/2007    Priority: Medium    History of adenomatous polyp of colon 08/05/2015    Priority: Low   Elevated blood protein 02/04/2015    Priority: Low   Hyperglycemia 11/27/2007    Priority: Low   Sickle-cell trait (HCC) 11/27/2007    Priority: Low   BPH (benign prostatic hyperplasia) 11/27/2007    Priority: Low   Stage 3 chronic kidney disease, unspecified whether stage 3a or 3b CKD (HCC) 11/02/2022   Thrombocytopenia (HCC) 09/19/2015    Medications- reviewed and updated Current Outpatient Medications  Medication Sig Dispense Refill   Coenzyme Q10 (CO Q 10 PO) Take 400 mg by mouth daily.     Multiple Vitamin (MULTIVITAMIN) capsule Take 1 capsule by mouth daily. Centrum silver     Omega-3 Fatty Acids  (FISH OIL) 1000 MG CAPS Take 1,000 mg by mouth daily.     triamcinolone  ointment (KENALOG ) 0.5 % Apply 1 Application topically 2 (two) times daily. Up to 7 days as needed for skin irritation such as poison ivy 60 g 1   valsartan  (DIOVAN ) 160 MG tablet Take 1 tablet (160 mg total) by mouth daily. 90 tablet 3   No current facility-administered medications for this visit.     Objective:  BP 136/74   Pulse 84   Temp (!) 97.2 F (36.2 C)   Ht 6' (1.829 m)   Wt  241 lb (109.3 kg)   SpO2 96%   BMI 32.69 kg/m  Gen: NAD, resting comfortably CV: RRR no murmurs rubs or gallops Lungs: CTAB no crackles, wheeze, rhonchi Ext: trace edema Skin: warm, dry     Assessment and Plan    #prostate cancer- under active surveillance -urology monitors PSAs with most recent prostate MRI- February 2023 with Dr. Nieves. Also had visit February 2025  #gammopathy/thrombocytopenia - released from hematology in 2017 and again in 2025. We continue to monitor CBC with diff. Also with thrombocytopenia .  We referred back in 2024 due to anemia but thankfully workup was reassuring-Dr. Lanny recommended only as needed follow-up-we will check CBC and CMP-consider recheck SPEP with reflex to IFE as well as kappa lambda light chains annually  #hyperlipidemia S: Medication: Atorvastatin  10mg   daily IN PAST- he has stopped -still working out with [facility] Lab Results  Component Value Date   CHOL 142 11/16/2023   HDL 53.00 11/16/2023   LDLCALC 78 11/16/2023   LDLDIRECT 81.0 11/13/2020   TRIG 55.0 11/16/2023   CHOLHDL 3 11/16/2023  A/P: close to ideal goal last visit- plan was to continue current medications - but he believed he was to stop this- we will check LDL and see if we need to restart- if LDL over 100  likely restart  #hypertension S: medication: valsartan  160Mg  - in past on hydrochlorothiazide  12.5 mg daily Home readings #s: doesn't check BP Readings from Last 3 Encounters:  05/18/24 136/74  12/05/23 (!) 153/70  11/16/23 132/76  A/P: blood pressure reasonably controlled- push for further improvement with healthy eating and regular exercise   # Hyperglycemia/insulin resistance/prediabetes- peak a1c 6.6 x1 with at home test, otherwise lower than that S:  Medication:  none Lab Results  Component Value Date   HGBA1C 4.4 (L) 11/16/2023   HGBA1C 5.7 11/02/2022   HGBA1C 5.9 07/01/2022   A/P: hopefully stable- update a1c today. Continue without meds for now    #history anemia- issues in 2024 requirng updated colonoscopy and endoscopy . -initially on pantoprazole  40 mg daily and carafate  1g twice daily after this with inflamed mucosa previously biopsied.  Was also treated for H. 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 Future Appointments  Date Time Provider Department Center  08/09/2024 11:20 AM [facility] ANNUAL WELLNESS VISIT 1 [facility]    Lab/Order associations: fasting   ICD-10-CM   1. Essential hypertension  I10 Comprehensive metabolic panel with GFR    CBC with Differential/Platelet    2. Hyperlipidemia, unspecified hyperlipidemia type  E78.5 Comprehensive metabolic panel with GFR    CBC with Differential/Platelet    LDL cholesterol, direct    3. Gammopathy  D47.2     4. Screening for diabetes mellitus  Z13.1 Hemoglobin A1c    5. Prediabetes  R73.03 Hemoglobin A1c      No orders of the defined types were placed in this encounter.   Return precautions advised.  Garnette Lukes, MD

## 2024-05-19 ENCOUNTER — Other Ambulatory Visit (HOSPITAL_BASED_OUTPATIENT_CLINIC_OR_DEPARTMENT_OTHER): Payer: Self-pay

## 2024-07-12 DIAGNOSIS — Z0100 Encounter for examination of eyes and vision without abnormal findings: Secondary | ICD-10-CM | POA: Diagnosis not present

## 2024-07-12 DIAGNOSIS — H43393 Other vitreous opacities, bilateral: Secondary | ICD-10-CM | POA: Diagnosis not present

## 2024-08-01 ENCOUNTER — Other Ambulatory Visit: Payer: Self-pay | Admitting: Medical Genetics

## 2024-08-01 DIAGNOSIS — Z006 Encounter for examination for normal comparison and control in clinical research program: Secondary | ICD-10-CM

## 2024-08-09 ENCOUNTER — Ambulatory Visit: Payer: Medicare (Managed Care)

## 2024-08-09 VITALS — BP 150/80 | HR 102 | Temp 98.3°F | Ht 72.0 in | Wt 242.8 lb

## 2024-08-09 DIAGNOSIS — Z0000 Encounter for general adult medical examination without abnormal findings: Secondary | ICD-10-CM | POA: Diagnosis not present

## 2024-08-09 NOTE — Progress Notes (Addendum)
 Subjective:   Caleb DELENA Paras Sr. is a 78 y.o. who presents for a Medicare Wellness preventive visit.  As a reminder, Annual Wellness Visits don't include a physical exam, and some assessments may be limited, especially if this visit is performed virtually. We may recommend an in-person follow-up visit with your provider if needed.  Visit Complete: In person    Persons Participating in Visit: Patient.  AWV Questionnaire: No: Patient Medicare AWV questionnaire was not completed prior to this visit.  Cardiac Risk Factors include: advanced age (>51men, >18 women);dyslipidemia;hypertension;male gender;obesity (BMI >30kg/m2)     Objective:    Today's Vitals   08/09/24 1109 08/09/24 1124  BP: (!) 160/80 (!) 150/80  Pulse: (!) 102   Temp: 98.3 F (36.8 C)   SpO2: 93%   Weight: 242 lb 12.8 oz (110.1 kg)   Height: 6' (1.829 m)    Body mass index is 32.93 kg/m.     08/09/2024   11:18 AM 08/04/2023   11:29 AM 04/28/2023    1:23 PM 02/14/2023   11:45 AM 07/29/2022   11:53 AM 08/12/2021    1:46 PM 07/16/2021   12:00 PM  Advanced Directives  Does Patient Have a Medical Advance Directive? Yes Yes No No Yes No Yes  Type of Estate Agent of [place];Living will Healthcare Power of [place];Living will   Healthcare Power of [place];Living will  Healthcare Power of Attorney  Copy of Healthcare Power of Attorney in Chart? No - copy requested No - copy requested   No - copy requested    Would patient like information on creating a medical advance directive?    No - Patient declined  No - Patient declined     Current Medications (verified) Outpatient Encounter Medications as of 08/09/2024  Medication Sig   atorvastatin  (LIPITOR) 10 MG tablet Take 1 tablet (10 mg total) by mouth daily.   Coenzyme Q10 (CO Q 10 PO) Take 400 mg by mouth daily.   Multiple Vitamin (MULTIVITAMIN) capsule Take 1 capsule by mouth daily. Centrum silver   Omega-3 Fatty Acids  (FISH OIL) 1000  MG CAPS Take 1,000 mg by mouth daily.   triamcinolone  ointment (KENALOG ) 0.5 % Apply 1 Application topically 2 (two) times daily. Up to 7 days as needed for skin irritation such as poison ivy   valsartan  (DIOVAN ) 160 MG tablet Take 1 tablet (160 mg total) by mouth daily.   No facility-administered encounter medications on file as of 08/09/2024.    Allergies (verified) Patient has no known allergies.   History: Past Medical History:  Diagnosis Date   Anemia Sickle cell trait   None   Hypertension 03-30-89   None   INGROWN NAIL 11/10/2010   Annotation: bilateral  Qualifier: Diagnosis of  By: Mavis MD, John E    Peripheral neuropathy    Sickle cell trait    Past Surgical History:  Procedure Laterality Date   CIRCUMCISION     age 46   COLONOSCOPY     ENDOSCOPIC MUCOSAL RESECTION  02/14/2023   Procedure: ENDOSCOPIC MUCOSAL RESECTION;  Surgeon: Wilhelmenia Aloha Raddle., MD;  Location: [facility];  Service: Gastroenterology;;   ESOPHAGOGASTRODUODENOSCOPY N/A 02/14/2023   Procedure: ESOPHAGOGASTRODUODENOSCOPY (EGD);  Surgeon: Wilhelmenia Aloha Raddle., MD;  Location: [facility];  Service: Gastroenterology;  Laterality: N/A;   EUS N/A 02/14/2023   Procedure: UPPER ENDOSCOPIC ULTRASOUND (EUS) RADIAL;  Surgeon: Wilhelmenia Aloha Raddle., MD;  Location: [facility];  Service: Gastroenterology;  Laterality: N/A;   HEMOSTASIS CLIP PLACEMENT  02/14/2023   Procedure: HEMOSTASIS CLIP PLACEMENT;  Surgeon: Wilhelmenia Aloha Raddle., MD;  Location: [facility];  Service: Gastroenterology;;   SUBMUCOSAL LIFTING INJECTION  02/14/2023   Procedure: SUBMUCOSAL LIFTING INJECTION;  Surgeon: Wilhelmenia Aloha Raddle., MD;  Location: [facility];  Service: Gastroenterology;;   SUBMUCOSAL TATTOO INJECTION  02/14/2023   Procedure: SUBMUCOSAL TATTOO INJECTION;  Surgeon: Wilhelmenia Aloha Raddle., MD;  Location: [facility];  Service: Gastroenterology;;   Family History  Problem Relation Age of Onset   Hypertension  Father    Heart disease Father    Hearing loss Mother    Heart disease Daughter    Obesity Daughter    Obesity Son    Diabetes Maternal Grandmother    Colon cancer Neg Hx    Esophageal cancer Neg Hx    Stomach cancer Neg Hx    Rectal cancer Neg Hx    Social History   Socioeconomic History   Marital status: Married    Spouse name: Ruby   Number of children: Not on file   Years of education: Not on file   Highest education level: Some college, no degree  Occupational History    Employer: RETIRED  Tobacco Use   Smoking status: Former    Current packs/day: 0.00    Average packs/day: 0.5 packs/day for 3.0 years (1.5 ttl pk-yrs)    Types: Cigarettes    Start date: 10/11/1965    Quit date: 10/11/1968    Years since quitting: 55.8   Smokeless tobacco: Never  Vaping Use   Vaping status: Never Used  Substance and Sexual Activity   Alcohol use: Yes    Alcohol/week: 4.0 standard drinks of alcohol    Types: 4 Shots of liquor per week    Comment: once a week    Drug use: No   Sexual activity: Not on file  Other Topics Concern   Not on file  Social History Narrative   Married (3rd marriage). 5 children (3 from 1st, 2 from second), 7 grandkids.       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 He lives with his wife in a 2 story house. He drinks one cup of coffee QOD, and occasionally drinks tea.   Social Drivers of Corporate Investment Banker Strain: Low Risk  (08/09/2024)   Overall Financial Resource Strain (CARDIA)    Difficulty of Paying Living Expenses: Not hard at all  Food Insecurity: No Food Insecurity (08/09/2024)   Hunger Vital Sign    Worried About Running Out of Food in the Last Year: Never true    Ran Out of Food in the Last Year: Never true  Transportation Needs: No Transportation Needs (08/09/2024)   PRAPARE - Administrator, Civil Service (Medical): No    Lack of  Transportation (Non-Medical): No  Physical Activity: Sufficiently Active (08/09/2024)   Exercise Vital Sign    Days of Exercise per Week: 5 days    Minutes of Exercise per Session: 60 min  Stress: No Stress Concern Present (08/09/2024)   [employer] of Occupational Health - Occupational Stress Questionnaire    Feeling of Stress: Not at all  Social Connections: Moderately Isolated (08/09/2024)   Social Connection and Isolation Panel    Frequency of Communication with Friends and Family: More than three times a week    Frequency of Social Gatherings with Friends and Family: More than three  times a week    Attends Religious Services: Never    Active Member of Clubs or Organizations: No    Attends Banker Meetings: Never    Marital Status: Married    Tobacco Counseling Counseling given: Not Answered    Clinical Intake:  Pre-visit preparation completed: Yes  Pain : No/denies pain     BMI - recorded: 32.93 Nutritional Status: BMI > 30  Obese Nutritional Risks: None Diabetes: No  Lab Results  Component Value Date   HGBA1C 5.6 05/18/2024   HGBA1C 4.4 (L) 11/16/2023   HGBA1C 5.7 11/02/2022     How often do you need to have someone help you when you read instructions, pamphlets, or other written materials from your doctor or pharmacy?: 1 - Never  Interpreter Needed?: No  Information entered by :: Ellouise Haws, LPN   Activities of Daily Living     08/09/2024   11:15 AM  In your present state of health, do you have any difficulty performing the following activities:  Hearing? 1  Comment slight loss of hearing  Vision? 0  Difficulty concentrating or making decisions? 0  Walking or climbing stairs? 0  Dressing or bathing? 0  Doing errands, shopping? 0  Preparing Food and eating ? N  Using the Toilet? N  In the past six months, have you accidently leaked urine? Y  Comment wears a pad  Do you have problems with loss of bowel control? N  Managing  your Medications? N  Managing your Finances? N  Housekeeping or managing your Housekeeping? N    Patient Care Team: Katrinka Garnette KIDD, MD as PCP - General (Family Medicine) Lonni Slain, MD as PCP - Cardiology (Cardiology) Lonni Slain, MD as Consulting Physician (Cardiology) Nicholaus Sherlean CROME, [facility] (Inactive) (Pharmacist) Lanny Callander, MD as Attending Physician (Hematology and Oncology) Burton, Lacie K, NP as Nurse Practitioner (Hematology and Oncology)  I have updated your Care Teams any recent Medical Services you may have received from other providers in the past year.     Assessment:   This is a routine wellness examination for Caleb Horton.  Hearing/Vision screen Hearing Screening - Comments:: Pt stated loss of hearing  Vision Screening - Comments:: Wears rx glasses - up to date with routine eye exams with [facility] Dr Raelyn    Goals Addressed               This Visit's Progress     weight loss (pt-stated)         Depression Screen     08/09/2024   11:18 AM 11/16/2023   11:00 AM 08/04/2023   11:30 AM 12/06/2022   12:54 PM 07/29/2022   11:51 AM 07/16/2021   11:59 AM 11/13/2020    9:48 AM  PHQ 2/9 Scores  PHQ - 2 Score 0 0 0 0 0 0 0  PHQ- 9 Score  0  0       Fall Risk     08/09/2024   11:20 AM 11/16/2023   11:00 AM 07/31/2023    8:51 AM 12/06/2022   12:54 PM 11/02/2022    3:10 PM  Fall Risk   Falls in the past year? 0 0 0 0 0  Number falls in past yr: 0 0 0 0 0  Injury with Fall? 0 0 0 0 0  Risk for fall due to : No Fall Risks No Fall Risks  No Fall Risks No Fall Risks  Follow up Falls prevention discussed Falls evaluation  completed Falls prevention discussed Falls evaluation completed Falls evaluation completed      Data saved with a previous flowsheet row definition    MEDICARE RISK AT HOME:  Medicare Risk at Home Any stairs in or around the home?: Yes If so, are there any without handrails?: No Home free of loose throw rugs in walkways,  pet beds, electrical cords, etc?: Yes Adequate lighting in your home to reduce risk of falls?: Yes Life alert?: No Use of a cane, walker or w/c?: No Grab bars in the bathroom?: Yes Shower chair or bench in shower?: No Elevated toilet seat or a handicapped toilet?: No  TIMED UP AND GO:  Was the test performed?  Yes  Length of time to ambulate 10 feet: 10 sec Gait steady and fast without use of assistive device  Cognitive Function: 6CIT completed    02/02/2017    9:47 AM  MMSE - Mini Mental State Exam  Not completed: --        08/09/2024   11:20 AM 08/04/2023   11:32 AM 07/29/2022   11:54 AM 07/16/2021   12:02 PM 07/10/2020   12:01 PM  6CIT Screen  What Year? 0 points 0 points 0 points 0 points 0 points  What month? 0 points 0 points 0 points 0 points 0 points  What time? 0 points 0 points 0 points 0 points   Count back from 20 0 points 0 points 0 points 0 points 0 points  Months in reverse 0 points 0 points 0 points 0 points 0 points  Repeat phrase 0 points 0 points 0 points 0 points 0 points  Total Score 0 points 0 points 0 points 0 points     Immunizations Immunization History  Administered Date(s) Administered   Fluad Quad(high Dose 65+) 06/14/2019, 09/15/2020, 09/24/2022   Fluad Trivalent(High Dose 65+) 11/16/2023   INFLUENZA, HIGH DOSE SEASONAL PF 07/14/2013, 07/05/2014   Influenza Split 08/15/2012   Influenza-Unspecified 07/20/2015, 07/26/2018   PFIZER Comirnaty (Gray Top)Covid-19 Tri-Sucrose Vaccine 02/09/2021   PFIZER(Purple Top)SARS-COV-2 Vaccination 12/04/2019, 12/25/2019, 07/15/2020   Pfizer Covid-19 Vaccine Bivalent Booster 72yrs & up 08/12/2021, 02/23/2022   Pfizer(Comirnaty )Fall Seasonal Vaccine 12 years and older 09/24/2022, 11/28/2023   Pneumococcal Conjugate-13 07/05/2014   Pneumococcal Polysaccharide-23 02/12/2013   Respiratory Syncytial Virus Vaccine ,Recomb Aduvanted(Arexvy ) 07/09/2022   Td 10/11/2005   Tdap 03/17/2018   Zoster  Recombinant(Shingrix) 12/09/2021, 02/17/2022   Zoster, Live 02/04/2015    Screening Tests Health Maintenance  Topic Date Due   COVID-19 Vaccine (9 - 2025-26 season) 06/11/2024   Influenza Vaccine  01/08/2025 (Originally 05/11/2024)   Hepatitis C Screening  10/08/2098 (Originally 06/19/1964)   Medicare Annual Wellness (AWV)  08/09/2025   Colonoscopy  10/29/2025   DTaP/Tdap/Td (3 - Td or Tdap) 03/17/2028   Pneumococcal Vaccine: 50+ Years  Completed   Zoster Vaccines- Shingrix  Completed   Meningococcal B Vaccine  Aged Out    Health Maintenance Items Addressed: Vaccines Due: and discussed , See Nurse Notes at the end of this note  Additional Screening:  Vision Screening: Recommended annual ophthalmology exams for early detection of glaucoma and other disorders of the eye. Is the patient up to date with their annual eye exam?  Yes  Who is the provider or what is the name of the office in which the patient attends annual eye exams? [facility]   Dental Screening: Recommended annual dental exams for proper oral hygiene  Community Resource Referral / Chronic Care Management: CRR required this visit?  No  CCM required this visit?  No   Plan:    I have personally reviewed and noted the following in the patient's chart:   Medical and social history Use of alcohol, tobacco or illicit drugs  Current medications and supplements including opioid prescriptions. Patient is not currently taking opioid prescriptions. Functional ability and status Nutritional status Physical activity Advanced directives List of other physicians Hospitalizations, surgeries, and ER visits in previous 12 months Vitals Screenings to include cognitive, depression, and falls Referrals and appointments  In addition, I have reviewed and discussed with patient certain preventive protocols, quality metrics, and best practice recommendations. A written personalized care plan for preventive services as well as  general preventive health recommendations were provided to patient.   Ellouise VEAR Haws, LPN   [id number]   After Visit Summary: (In Person-Printed) AVS printed and given to the patient  Notes: PCP Follow Up Recommendations: pt stated had taken medication , blood pressure sheet given to monitor over next few weeks with appt to be made at checkout for 09/20/24

## 2024-08-09 NOTE — Patient Instructions (Signed)
 Caleb Horton,  Thank you for taking the time for your Medicare Wellness Visit. I appreciate your continued commitment to your health goals. Please review the care plan we discussed, and feel free to reach out if I can assist you further.  Medicare recommends these wellness visits once per year to help you and your care team stay ahead of potential health issues. These visits are designed to focus on prevention, allowing your provider to concentrate on managing your acute and chronic conditions during your regular appointments.  Please note that Annual Wellness Visits do not include a physical exam. Some assessments may be limited, especially if the visit was conducted virtually. If needed, we may recommend a separate in-person follow-up with your provider.  Ongoing Care Seeing your primary care provider every 3 to 6 months helps us  monitor your health and provide consistent, personalized care.   Referrals If a referral was made during today's visit and you haven't received any updates within two weeks, please contact the referred provider directly to check on the status.  Recommended Screenings:  Health Maintenance  Topic Date Due   COVID-19 Vaccine (9 - 2025-26 season) 06/11/2024   Medicare Annual Wellness Visit  08/03/2024   Flu Shot  01/08/2025*   Hepatitis C Screening  10/08/2098*   Colon Cancer Screening  10/29/2025   DTaP/Tdap/Td vaccine (3 - Td or Tdap) 03/17/2028   Pneumococcal Vaccine for age over 13  Completed   Zoster (Shingles) Vaccine  Completed   Meningitis B Vaccine  Aged Out  *Topic was postponed. The date shown is not the original due date.       08/04/2023   11:29 AM  Advanced Directives  Does Patient Have a Medical Advance Directive? Yes  Type of Estate Agent of [place];Living will  Copy of Healthcare Power of Attorney in Chart? No - copy requested   Advance Care Planning is important because it: Ensures you receive medical care that  aligns with your values, goals, and preferences. Provides guidance to your family and loved ones, reducing the emotional burden of decision-making during critical moments.  Vision: Annual vision screenings are recommended for early detection of glaucoma, cataracts, and diabetic retinopathy. These exams can also reveal signs of chronic conditions such as diabetes and high blood pressure.  Dental: Annual dental screenings help detect early signs of oral cancer, gum disease, and other conditions linked to overall health, including heart disease and diabetes.  Please see the attached documents for additional preventive care recommendations.

## 2024-09-03 LAB — GENECONNECT MOLECULAR SCREEN: Genetic Analysis Overall Interpretation: NEGATIVE

## 2024-09-20 ENCOUNTER — Encounter: Payer: Self-pay | Admitting: Family Medicine

## 2024-09-20 ENCOUNTER — Ambulatory Visit (INDEPENDENT_AMBULATORY_CARE_PROVIDER_SITE_OTHER): Payer: Medicare (Managed Care) | Admitting: Family Medicine

## 2024-09-20 VITALS — BP 144/76 | HR 71 | Temp 97.7°F | Ht 72.0 in | Wt 243.8 lb

## 2024-09-20 DIAGNOSIS — I10 Essential (primary) hypertension: Secondary | ICD-10-CM | POA: Diagnosis not present

## 2024-09-20 MED ORDER — VALSARTAN-HYDROCHLOROTHIAZIDE 160-12.5 MG PO TABS: 1.0000 | ORAL_TABLET | Freq: Every day | ORAL | 3 refills | Status: AC

## 2024-09-20 NOTE — Patient Instructions (Addendum)
 blood pressure gradually increasing over time with weight gain. He wants to work on reversing this after the holidays but we also don't want to let his blood pressure remain high for now so we opted to restart his hydrochlorothiazide  element- so he will take valsartan  hydrochlorothiazide  160-12.5 mg through mail order and update me in 1-2 weeks by mychart with home readings after starting new dose  As you work on weight loss if you get lightheaded/dizzy again update me with your blood pressure readings as we may need to decrease  Recommended follow up: Return for next already scheduled visit or sooner if needed.

## 2024-09-20 NOTE — Progress Notes (Signed)
 Phone [phone] In person visit   Subjective:   Caleb TURKO Sr. is a 78 y.o. 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    Priority: Medium    Essential hypertension 11/27/2007    Priority: Medium    History of adenomatous polyp of colon 08/05/2015    Priority: Low   Elevated blood protein 02/04/2015    Priority: Low   Hyperglycemia 11/27/2007    Priority: Low   Sickle-cell trait 11/27/2007    Priority: Low   BPH (benign prostatic hyperplasia) 11/27/2007    Priority: Low   Stage 3 chronic kidney disease, unspecified whether stage 3a or 3b CKD (HCC) 11/02/2022   Thrombocytopenia 09/19/2015    Medications- reviewed and updated Current Outpatient Medications  Medication Sig Dispense Refill   atorvastatin  (LIPITOR) 10 MG tablet Take 1 tablet (10 mg total) by mouth daily. 90 tablet 3   Coenzyme Q10 (CO Q 10 PO) Take 400 mg by mouth daily.     Multiple Vitamin (MULTIVITAMIN) capsule Take 1 capsule by mouth daily. Centrum silver     Omega-3 Fatty Acids  (FISH OIL) 1000 MG CAPS Take 1,000 mg by mouth daily.     triamcinolone  ointment (KENALOG ) 0.5 % Apply 1 Application topically 2 (two) times daily. Up to 7 days as needed for skin irritation such as poison ivy 60 g 1   valsartan -hydrochlorothiazide  (DIOVAN -HCT) 160-12.5 MG tablet Take 1 tablet by mouth daily. 90 tablet 3   No current facility-administered medications for this visit.     Objective:  BP (!) 144/76   Pulse 71    Temp 97.7 F (36.5 C) (Temporal)   Ht 6' (1.829 m)   Wt 243 lb 12.8 oz (110.6 kg)   SpO2 96%   BMI 33.07 kg/m  Gen: NAD, resting comfortably CV: RRR no murmurs rubs or gallops Lungs: CTAB no crackles, wheeze, rhonchi Ext: no edema Skin: warm, dry     Assessment and Plan   #hypertension S: medication: valsartan  160Mg  - feels he could work on getting weight back down from 240s- had been down to 210 last year -blood pressure was running low on hydrochlorothiazide  12.5 mg daily but had lost wegiht- now he is up about 15 lbs in last year and over 30 lbs from his low February 2024 at 211.5 as noted as above Home readings #s: in last month ranging from mainly 130s up to 160 with diastolic readings from 70s up to 90's -no chest pain, shortness of breath, headache(s), blurry vision  BP Readings from Last 3 Encounters:  09/20/24 (!) 144/76  08/09/24 (!) 150/80  05/18/24 136/74  A/P: Poor control.  Blood pressure gradually increasing over time with weight gain. He wants to work on reversing this after the holidays but we also don't want to let his blood pressure remain high for now so we opted to restart his hydrochlorothiazide  element- so he will take valsartan  hydrochlorothiazide  160-12.5 mg through mail order and  update me in a month by mychart with home readings.   Recommended follow up: Return for next already scheduled visit or sooner if needed. Future Appointments  Date Time Provider Department Center  11/16/2024  2:00 PM Katrinka Garnette KIDD, MD [facility]  08/26/2025  2:20 PM [facility] ANNUAL WELLNESS VISIT 1 [facility]    Lab/Order associations:   ICD-10-CM   1. Essential hypertension  I10       Meds ordered this encounter  Medications   valsartan -hydrochlorothiazide  (DIOVAN -HCT) 160-12.5 MG tablet    Sig: Take 1 tablet by mouth daily.    Dispense:  90 tablet    Refill:  3    Return precautions advised.  Garnette Katrinka, MD

## 2024-10-17 ENCOUNTER — Other Ambulatory Visit: Payer: Self-pay | Admitting: Family Medicine

## 2024-10-23 NOTE — Telephone Encounter (Unsigned)
 Copied from CRM #[id number]. Topic: Clinical - Medication Refill >> Oct 23, 2024  9:18 AM Olam RAMAN wrote: Medication: valsartan -hydrochlorothiazide  (DIOVAN -HCT) 160-12.5 MG tablet   Has the patient contacted their pharmacy? No (Agent: If no, request that the patient contact the pharmacy for the refill. If patient does not wish to contact the pharmacy document the reason why and proceed with request.) (Agent: If yes, when and what did the pharmacy advise?)  This is the patient's preferred pharmacy: [facility] - [place], [place] - [address] [place] [address] Phone: [phone] Fax: [phone]  Is this the correct pharmacy for this prescription? Yes If no, delete pharmacy and type the correct one.   Has the prescription been filled recently? Yes  Is the patient out of the medication? No  Has the patient been seen for an appointment in the last year OR does the patient have an upcoming appointment? Yes  Can we respond through MyChart? No  Agent: Please be advised that Rx refills may take up to 3 business days. We ask that you follow-up with your pharmacy.

## 2024-10-29 ENCOUNTER — Other Ambulatory Visit (HOSPITAL_BASED_OUTPATIENT_CLINIC_OR_DEPARTMENT_OTHER): Payer: Self-pay

## 2024-10-29 MED ORDER — COMIRNATY 30 MCG/0.3ML IM SUSY
0.3000 mL | PREFILLED_SYRINGE | Freq: Once | INTRAMUSCULAR | 0 refills | Status: AC
  Filled 2024-10-29: qty 0.3, 1d supply, fill #0

## 2024-10-29 MED ORDER — FLUZONE HIGH-DOSE 0.5 ML IM SUSY
0.5000 mL | PREFILLED_SYRINGE | Freq: Once | INTRAMUSCULAR | 0 refills | Status: AC
  Filled 2024-10-29: qty 0.5, 1d supply, fill #0

## 2024-11-07 ENCOUNTER — Other Ambulatory Visit (HOSPITAL_BASED_OUTPATIENT_CLINIC_OR_DEPARTMENT_OTHER): Payer: Self-pay

## 2024-11-16 ENCOUNTER — Ambulatory Visit: Payer: Medicare (Managed Care) | Admitting: Family Medicine

## 2024-11-16 ENCOUNTER — Encounter: Payer: Self-pay | Admitting: Family Medicine

## 2024-11-16 VITALS — BP 120/66 | HR 84 | Temp 97.9°F | Ht 72.0 in | Wt 240.4 lb

## 2024-11-16 DIAGNOSIS — N529 Male erectile dysfunction, unspecified: Secondary | ICD-10-CM | POA: Insufficient documentation

## 2024-11-16 DIAGNOSIS — E785 Hyperlipidemia, unspecified: Secondary | ICD-10-CM

## 2024-11-16 DIAGNOSIS — D472 Monoclonal gammopathy: Secondary | ICD-10-CM

## 2024-11-16 DIAGNOSIS — R7303 Prediabetes: Secondary | ICD-10-CM

## 2024-11-16 DIAGNOSIS — Z0001 Encounter for general adult medical examination with abnormal findings: Secondary | ICD-10-CM

## 2024-11-16 DIAGNOSIS — I10 Essential (primary) hypertension: Secondary | ICD-10-CM

## 2024-11-16 DIAGNOSIS — Z131 Encounter for screening for diabetes mellitus: Secondary | ICD-10-CM

## 2024-11-16 DIAGNOSIS — C61 Malignant neoplasm of prostate: Secondary | ICD-10-CM

## 2024-11-16 LAB — CBC WITH DIFFERENTIAL/PLATELET
Absolute Lymphocytes: 2077 {cells}/uL (ref 850–3900)
Absolute Monocytes: 791 {cells}/uL (ref 200–950)
Basophils Absolute: 40 {cells}/uL (ref 0–200)
Basophils Relative: 0.6 %
Eosinophils Absolute: 208 {cells}/uL (ref 15–500)
Eosinophils Relative: 3.1 %
HCT: 34.3 % — ABNORMAL LOW (ref 39.4–51.1)
Hemoglobin: 11.3 g/dL — ABNORMAL LOW (ref 13.2–17.1)
MCH: 28 pg (ref 27.0–33.0)
MCHC: 32.9 g/dL (ref 31.6–35.4)
MCV: 85.1 fL (ref 81.4–101.7)
MPV: 10.8 fL (ref 7.5–12.5)
Monocytes Relative: 11.8 %
Neutro Abs: 3585 {cells}/uL (ref 1500–7800)
Neutrophils Relative %: 53.5 %
Platelets: 193 10*3/uL (ref 140–400)
RBC: 4.03 Million/uL — ABNORMAL LOW (ref 4.20–5.80)
RDW: 15 % (ref 11.0–15.0)
Total Lymphocyte: 31 %
WBC: 6.7 10*3/uL (ref 3.8–10.8)

## 2024-11-16 MED ORDER — TADALAFIL 20 MG PO TABS: 10.0000 mg | ORAL_TABLET | ORAL | 11 refills | Status: AC | PRN

## 2024-11-16 NOTE — Patient Instructions (Addendum)
 Please stop by lab before you go If you have mychart- we will send your results within 3 business days of us  receiving them.  If you do not have mychart- we will call you about results within 5 business days of us  receiving them.  *please also note that you will see labs on mychart as soon as they post. I will later go in and write notes on them- will say notes from Dr. Katrinka    blood pressure well controlled today  on the combo pill valsartan -hydrochlorothiazide  160-12.5 mg but we discussed if he loses weight again we may have to reduce again to plain valsartan  160 mg- he will keep an eye on this  erectile dysfunction (ED) with maintenance issues on erections. We discussed options of generic Viagra and Cialis - hed prefer Cialis  20 mg tablets- advised him to start with just half tablet and only take maximum every 48 hours  Recommended follow up: Return in about 6 months (around 05/16/2025) for followup or sooner if needed.Schedule b4 you leave.

## 2024-11-16 NOTE — Progress Notes (Signed)
 Confirmed with [facility] that Valsartan  has been canceled.

## 2024-11-16 NOTE — Progress Notes (Signed)
 " Phone [phone] In person visit   Subjective:   Caleb RHOADS Sr. is a 79 y.o. 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    Priority: Medium    Essential hypertension 11/27/2007    Priority: Medium    History of adenomatous polyp of colon 08/05/2015    Priority: Low   Elevated blood protein 02/04/2015    Priority: Low   Hyperglycemia 11/27/2007    Priority: Low   Sickle-cell trait 11/27/2007    Priority: Low   BPH (benign prostatic hyperplasia) 11/27/2007    Priority: Low   Erectile dysfunction 11/16/2024   Stage 3 chronic kidney disease, unspecified whether stage 3a or 3b CKD (HCC) 11/02/2022   Thrombocytopenia 09/19/2015    Medications- reviewed and updated Current Outpatient Medications  Medication Sig Dispense Refill   atorvastatin  (LIPITOR) 10 MG tablet Take 1 tablet (10 mg total) by mouth daily. 90 tablet 3   Coenzyme Q10 (CO Q 10 PO) Take 400 mg by mouth daily.     Omega-3 Fatty Acids  (FISH OIL) 1000 MG CAPS Take 1,000 mg by mouth daily.     triamcinolone  ointment (KENALOG ) 0.5 % Apply 1 Application topically 2 (two) times daily. Up to 7 days as needed for skin irritation such as poison ivy 60 g 1   valsartan -hydrochlorothiazide  (DIOVAN -HCT) 160-12.5 MG tablet Take 1 tablet by mouth daily. 90 tablet 3   Multiple Vitamin (MULTIVITAMIN) capsule Take 1 capsule by mouth daily. Centrum silver (Patient not taking: Reported on 11/16/2024)     No current facility-administered medications for this visit.     Objective:  BP 120/66  (BP Location: Left Arm, Patient Position: Sitting, Cuff Size: Normal)   Pulse 84   Temp 97.9 F (36.6 C) (Temporal)   Ht 6' (1.829 m)   Wt 240 lb 6.4 oz (109 kg)   SpO2 95%   BMI 32.60 kg/m  Gen: NAD, resting comfortably    Assessment and Plan   Erectile dysfunction S: Patient has noted issues starting last 7-8 months. He is able to get an erection but after ejaculate unable to maintain which is atypical for him. His libido is still normal- has not dropped A/P: erectile dysfunction (ED) with maintenance issues on erections. We discussed options of generic Viagra and Cialis - hed prefer Cialis  20 mg tablets- advised him to start with just half tablet and only take maximum every 48 hours  Recommended follow up: Return in about 6 months (around 05/16/2025) for followup or sooner if needed.Schedule b4 you leave. Future Appointments  Date Time Provider Department Center  08/26/2025  2:20 PM [facility] ANNUAL WELLNESS VISIT 1 [facility] [place]    Lab/Order associations:   ICD-10-CM   1. Erectile dysfunction, unspecified erectile dysfunction type  N52.9     Meds ordered this encounter  Medications   tadalafil  (CIALIS ) 20 MG tablet    Sig: Take 0.5-1 tablets (10-20 mg total) by mouth every other day as needed for erectile dysfunction.    Dispense:  10 tablet  Refill:  11    Return precautions advised.  Garnette Lukes, MD   "

## 2024-11-16 NOTE — Assessment & Plan Note (Signed)
 S: Patient has noted issues starting last 7-8 months. He is able to get an erection but after ejaculate unable to maintain which is atypical for him. His libido is still normal- has not dropped A/P: erectile dysfunction (ED) with maintenance issues on erections. We discussed options of generic Viagra and Cialis - hed prefer Cialis  20 mg tablets- advised him to start with just half tablet and only take maximum every 48 hours

## 2024-11-16 NOTE — Progress Notes (Signed)
 " Phone: [phone]   Subjective:  Patient presents today for their annual physical. Chief complaint-noted.   See problem oriented charting- ROS- full  review of systems was completed and negative  except for topics noted under acute/chronic concerns  The following were reviewed and entered/updated in epic: Past Medical History:  Diagnosis Date   Anemia Sickle cell trait   None   Hypertension 03-30-89   None   INGROWN NAIL 11/10/2010   Annotation: bilateral  Qualifier: Diagnosis of  By: Mavis MD, John E    Peripheral neuropathy    Prostate cancer (HCC) 06/2018   Sickle cell anemia (HCC) Sickle cell trait   Sickle cell trait    Patient Active Problem List   Diagnosis Date Noted   Prostate cancer (HCC) 08/31/2017    Priority: High   Gammopathy 03/20/2015    Priority: High   AAA (abdominal aortic aneurysm) 03/17/2017    Priority: Medium    Idiopathic neuropathy 02/07/2017    Priority: Medium    Hyperlipidemia 09/04/2014    Priority: Medium    CKD (chronic kidney disease), 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    Priority: Medium    Essential hypertension 11/27/2007    Priority: Medium    History of adenomatous polyp of colon 08/05/2015    Priority: Low   Elevated blood protein 02/04/2015    Priority: Low   Hyperglycemia 11/27/2007    Priority: Low   Sickle-cell trait 11/27/2007    Priority: Low   BPH (benign prostatic hyperplasia) 11/27/2007    Priority: Low   Stage 3 chronic kidney disease, unspecified whether stage 3a or 3b CKD (HCC) 11/02/2022   Thrombocytopenia 09/19/2015   Past Surgical History:  Procedure Laterality Date   CIRCUMCISION     age 60   COLONOSCOPY     ENDOSCOPIC MUCOSAL RESECTION  02/14/2023   Procedure: ENDOSCOPIC MUCOSAL RESECTION;  Surgeon: Wilhelmenia Aloha Raddle., MD;  Location: [facility];  Service: Gastroenterology;;   ESOPHAGOGASTRODUODENOSCOPY N/A 02/14/2023   Procedure: ESOPHAGOGASTRODUODENOSCOPY (EGD);  Surgeon: Wilhelmenia Aloha Raddle., MD;   Location: [facility];  Service: Gastroenterology;  Laterality: N/A;   EUS N/A 02/14/2023   Procedure: UPPER ENDOSCOPIC ULTRASOUND (EUS) RADIAL;  Surgeon: Wilhelmenia Aloha Raddle., MD;  Location: [facility];  Service: Gastroenterology;  Laterality: N/A;   HEMOSTASIS CLIP PLACEMENT  02/14/2023   Procedure: HEMOSTASIS CLIP PLACEMENT;  Surgeon: Wilhelmenia Aloha Raddle., MD;  Location: [facility];  Service: Gastroenterology;;   SUBMUCOSAL LIFTING INJECTION  02/14/2023   Procedure: SUBMUCOSAL LIFTING INJECTION;  Surgeon: Wilhelmenia Aloha Raddle., MD;  Location: [facility];  Service: Gastroenterology;;   SUBMUCOSAL TATTOO INJECTION  02/14/2023   Procedure: SUBMUCOSAL TATTOO INJECTION;  Surgeon: Wilhelmenia Aloha Raddle., MD;  Location: [facility];  Service: Gastroenterology;;    Family History  Problem Relation Age of Onset   Hypertension Father    Heart disease Father    Hearing loss Mother    Heart disease Daughter    Obesity Daughter    Obesity Son    Diabetes Maternal Grandmother    Colon cancer Neg Hx    Esophageal cancer Neg Hx    Stomach cancer Neg Hx    Rectal cancer Neg Hx     Medications- reviewed and updated Current Outpatient Medications  Medication Sig Dispense Refill   atorvastatin  (LIPITOR) 10 MG tablet Take 1 tablet (10 mg total) by mouth daily. 90 tablet 3   Coenzyme Q10 (CO Q 10 PO) Take 400 mg by mouth daily.     Omega-3 Fatty Acids  (  FISH OIL) 1000 MG CAPS Take 1,000 mg by mouth daily.     triamcinolone  ointment (KENALOG ) 0.5 % Apply 1 Application topically 2 (two) times daily. Up to 7 days as needed for skin irritation such as poison ivy 60 g 1   valsartan -hydrochlorothiazide  (DIOVAN -HCT) 160-12.5 MG tablet Take 1 tablet by mouth daily. 90 tablet 3   Multiple Vitamin (MULTIVITAMIN) capsule Take 1 capsule by mouth daily. Centrum silver (Patient not taking: Reported on 11/16/2024)     No current facility-administered medications for this visit.    Allergies-reviewed and  updated Allergies[1]  Social History   Social History Narrative   Married (3rd marriage). 5 children (3 from 1st, 2 from second), 7 grandkids.       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 He lives with his wife in a 2 story house. He drinks one cup of coffee QOD, and occasionally drinks tea.   Objective  Objective:  BP 120/66 (BP Location: Left Arm, Patient Position: Sitting, Cuff Size: Normal)   Pulse 84   Temp 97.9 F (36.6 C) (Temporal)   Ht 6' (1.829 m)   Wt 240 lb 6.4 oz (109 kg)   SpO2 95%   BMI 32.60 kg/m  Gen: NAD, resting comfortably HEENT: Mucous membranes are moist. Oropharynx normal Neck: no thyromegaly CV: RRR no murmurs rubs or gallops Lungs: CTAB no crackles, wheeze, rhonchi Abdomen: soft/nontender/nondistended/normal bowel sounds. No rebound or guarding.  Ext: no edema Skin: warm, dry Neuro: grossly normal, moves all extremities, PERRLA    Assessment and Plan  79 y.o. male presenting for annual physical.  Health Maintenance counseling: 1. Anticipatory guidance: Patient counseled regarding regular dental exams -q6 months, eye exams -yearly,  avoiding smoking and second hand smoke , limiting alcohol to 2 beverages per day - prior 4 shots per week has cut back to 1 a month, no illicit drugs .   2. Risk factor reduction:  Advised patient of need for regular exercise and diet rich and fruits and vegetables to reduce risk of heart attack and stroke.  Exercise- encouraged to get back to his [employer] classes.  Diet/weight management-down 3 lbs from last visit- encouraged mild weight loss.  Wt Readings from Last 3 Encounters:  11/16/24 240 lb 6.4 oz (109 kg)  09/20/24 243 lb 12.8 oz (110.6 kg)  08/09/24 242 lb 12.8 oz (110.1 kg)  3. Immunizations/screenings/ancillary studies- up to date  Immunization History  Administered Date(s) Administered   Fluad Quad(high Dose 65+) 06/14/2019,  09/15/2020, 09/24/2022   Fluad Trivalent(High Dose 65+) 11/16/2023   INFLUENZA, HIGH DOSE SEASONAL PF 07/14/2013, 07/05/2014, 10/29/2024   Influenza Split 08/15/2012   Influenza-Unspecified 07/20/2015, 07/26/2018   PFIZER Comirnaty (Gray Top)Covid-19 Tri-Sucrose Vaccine 02/09/2021   PFIZER(Purple Top)SARS-COV-2 Vaccination 12/04/2019, 12/25/2019, 07/15/2020   Pfizer Covid-19 Vaccine Bivalent Booster 41yrs & up 08/12/2021, 02/23/2022   Pfizer(Comirnaty )Fall Seasonal Vaccine 12 years and older 09/24/2022, 11/28/2023, 10/29/2024   Pneumococcal Conjugate-13 07/05/2014   Pneumococcal Polysaccharide-23 02/12/2013   Respiratory Syncytial Virus Vaccine ,Recomb Aduvanted(Arexvy ) 07/09/2022   Td 10/11/2005   Tdap 03/17/2018   Zoster Recombinant(Shingrix) 12/09/2021, 02/17/2022   Zoster, Live 02/04/2015  4. Prostate cancer screening- under active surveillance with [facility]  Lab Results  Component Value Date   PSA 4.97 04/03/2020   PSA 3.78 08/29/2019   PSA 5.24 (H) 02/24/2018   5. Colon cancer screening - History of adenomatous colon polyps October 29, 2020 with 5-year repeat 6. Skin cancer screening- lower risk due to melanin content. advised regular sunscreen use. Denies worrisome, changing, or new skin lesions.  7. Smoking associated screening (lung cancer screening, AAA screen 65-75, UA)- former smoker- quit long ago- no lung cancer screening needed. AAA screening- noted on initial screen but not on repeat- have contacted radiologist in past and they stated no further follow up needed  8. STD screening - only active with his wife  Status of chronic or acute concerns   #erectile dysfunction (ED) - see separate note  #prostate cancer- under active surveillance -urology monitors PSAs with most recent prostate MRI- February 2023 with Dr. Nieves. Visit in office February 2025- scheduled to see urology later this month- prefers for them to do psa  #gammopathy/thrombocytopenia - released  from hematology in 2017 and again in 2025. We continue to monitor CBC with diff. Also with thrombocytopenia .  We referred back in 2024 due to anemia but thankfully workup was reassuring-Dr. Lanny recommended only as needed follow-up-we will check CBC and CMP-consider recheck SPEP with reflex to IFE as well as kappa lambda light chains annually- I will check today  #possible CKD III- many measures under 60 GFR but most recently  #hyperlipidemia S: Medication: Atorvastatin  10mg  - in 2024-2025 he thought he was to stop but has restarted  Lab Results  Component Value Date   CHOL 142 11/16/2023   HDL 53.00 11/16/2023   LDLCALC 78 11/16/2023   LDLDIRECT 130.0 05/18/2024   TRIG 55.0 11/16/2023   CHOLHDL 3 11/16/2023  A/P: hopefully stable orimproved - update lipid panel today. Continue current meds for now   #hypertension S: medication: valsartan  160Mg - hydrochlorothiazide  12.5 mg added back with weight gain December 2025 after prior stopping the hydrochlorothiazide  with weight loss A/P: blood pressure well controlled today  on the combo pill valsartan -hydrochlorothiazide  160-12.5 mg but we discussed if he loses weight again we may have to reduce again to plain valsartan  160 mg- he will keep an eye on this  # Hyperglycemia/insulin resistance/prediabetes- peak a1c 6.6 x1 with at home test, otherwise lower than that S:  Medication:  none Exercise and diet-  [employer] program still with weather- encouraged to restart as weater improved Lab Results  Component Value Date   HGBA1C 5.6 05/18/2024   HGBA1C 4.4 (L) 11/16/2023   HGBA1C 5.7 11/02/2022   A/P: prediabetes had improved last 2 checks- recheck today with labs  #anemia- issues in 2024 requirng updated colonoscopy and endoscopy . Reports off proton pump inhibitor (PPI) stomach acid reducer- had been in past.   #sickle cell trait - noted- no issues  Recommended follow up: Return in about 6 months (around 05/16/2025) for followup or sooner if  needed.Schedule b4 you leave. Future Appointments  Date Time Provider Department Center  08/26/2025  2:20 PM [facility] ANNUAL WELLNESS VISIT 1 [facility] [place]   Lab/Order associations:NOT fasting   ICD-10-CM   1. Encounter for general adult medical examination with abnormal findings  Z00.01     2. Erectile dysfunction, unspecified erectile dysfunction type  N52.9     3. Screening for diabetes mellitus  Z13.1 Hemoglobin A1c    4. Prediabetes  R73.03 Hemoglobin A1c    5. Essential hypertension  I10 CBC with Differential/Platelet    Comprehensive metabolic panel with GFR    6. Hyperlipidemia, unspecified hyperlipidemia type  E78.5 CBC with Differential/Platelet    Comprehensive metabolic panel with GFR    Lipid panel    7. Prostate  cancer (HCC)  C61     8. Gammopathy  D47.2 SPEP with reflex to IFE    Kappa/lambda light chains      No orders of the defined types were placed in this encounter.   Return precautions advised.  Garnette Lukes, MD      [1] No Known Allergies  "

## 2025-08-26 ENCOUNTER — Ambulatory Visit: Payer: Medicare (Managed Care)
